# Patient Record
Sex: Female | Born: 1945
Health system: Southern US, Community
[De-identification: ages and names within clinical notes are randomized; demographics above are authoritative.]

## PROBLEM LIST (undated history)

## (undated) DIAGNOSIS — J439 Emphysema, unspecified: Secondary | ICD-10-CM

## (undated) DIAGNOSIS — E785 Hyperlipidemia, unspecified: Secondary | ICD-10-CM

## (undated) DIAGNOSIS — K900 Celiac disease: Secondary | ICD-10-CM

## (undated) DIAGNOSIS — D6851 Activated protein C resistance: Secondary | ICD-10-CM

## (undated) DIAGNOSIS — Z923 Personal history of irradiation: Secondary | ICD-10-CM

## (undated) DIAGNOSIS — I639 Cerebral infarction, unspecified: Secondary | ICD-10-CM

## (undated) DIAGNOSIS — I6521 Occlusion and stenosis of right carotid artery: Secondary | ICD-10-CM

## (undated) DIAGNOSIS — C3490 Malignant neoplasm of unspecified part of unspecified bronchus or lung: Secondary | ICD-10-CM

## (undated) DIAGNOSIS — I10 Essential (primary) hypertension: Secondary | ICD-10-CM

## (undated) DIAGNOSIS — I739 Peripheral vascular disease, unspecified: Secondary | ICD-10-CM

## (undated) DIAGNOSIS — C50919 Malignant neoplasm of unspecified site of unspecified female breast: Secondary | ICD-10-CM

## (undated) DIAGNOSIS — R0600 Dyspnea, unspecified: Secondary | ICD-10-CM

## (undated) DIAGNOSIS — J123 Human metapneumovirus pneumonia: Secondary | ICD-10-CM

## (undated) DIAGNOSIS — Z8719 Personal history of other diseases of the digestive system: Secondary | ICD-10-CM

## (undated) DIAGNOSIS — D759 Disease of blood and blood-forming organs, unspecified: Secondary | ICD-10-CM

## (undated) DIAGNOSIS — J9600 Acute respiratory failure, unspecified whether with hypoxia or hypercapnia: Secondary | ICD-10-CM

## (undated) HISTORY — DX: Hyperlipidemia, unspecified: E78.5

## (undated) HISTORY — DX: Essential (primary) hypertension: I10

## (undated) HISTORY — DX: Malignant neoplasm of unspecified site of unspecified female breast: C50.919

## (undated) HISTORY — DX: Occlusion and stenosis of right carotid artery: I65.21

## (undated) HISTORY — PX: TRIGGER FINGER RELEASE: SHX641

## (undated) HISTORY — PX: BREAST LUMPECTOMY: SHX2

## (undated) HISTORY — DX: Cerebral infarction, unspecified: I63.9

## (undated) HISTORY — DX: Emphysema, unspecified: J43.9

## (undated) HISTORY — DX: Human metapneumovirus pneumonia: J12.3

## (undated) HISTORY — DX: Malignant neoplasm of unspecified part of unspecified bronchus or lung: C34.90

---

## 1978-06-16 HISTORY — PX: TUBAL LIGATION: SHX77

## 1998-07-06 ENCOUNTER — Other Ambulatory Visit: Admission: RE | Admit: 1998-07-06 | Discharge: 1998-07-06 | Payer: Self-pay | Admitting: Obstetrics and Gynecology

## 1998-10-18 ENCOUNTER — Encounter: Payer: Self-pay | Admitting: Internal Medicine

## 1998-10-18 ENCOUNTER — Ambulatory Visit (HOSPITAL_COMMUNITY): Admission: RE | Admit: 1998-10-18 | Discharge: 1998-10-18 | Payer: Self-pay | Admitting: Internal Medicine

## 1999-08-01 ENCOUNTER — Other Ambulatory Visit: Admission: RE | Admit: 1999-08-01 | Discharge: 1999-08-01 | Payer: Self-pay | Admitting: Obstetrics and Gynecology

## 1999-08-09 ENCOUNTER — Encounter: Payer: Self-pay | Admitting: Obstetrics and Gynecology

## 1999-08-09 ENCOUNTER — Encounter: Admission: RE | Admit: 1999-08-09 | Discharge: 1999-08-09 | Payer: Self-pay | Admitting: Obstetrics and Gynecology

## 1999-09-05 ENCOUNTER — Ambulatory Visit (HOSPITAL_COMMUNITY): Admission: RE | Admit: 1999-09-05 | Discharge: 1999-09-05 | Payer: Self-pay | Admitting: Obstetrics and Gynecology

## 1999-09-05 ENCOUNTER — Encounter: Payer: Self-pay | Admitting: Obstetrics and Gynecology

## 2000-04-01 ENCOUNTER — Encounter (INDEPENDENT_AMBULATORY_CARE_PROVIDER_SITE_OTHER): Payer: Self-pay

## 2000-04-01 ENCOUNTER — Other Ambulatory Visit: Admission: RE | Admit: 2000-04-01 | Discharge: 2000-04-01 | Payer: Self-pay | Admitting: Obstetrics and Gynecology

## 2000-08-10 ENCOUNTER — Other Ambulatory Visit: Admission: RE | Admit: 2000-08-10 | Discharge: 2000-08-10 | Payer: Self-pay | Admitting: Obstetrics and Gynecology

## 2000-09-03 ENCOUNTER — Encounter: Payer: Self-pay | Admitting: Obstetrics and Gynecology

## 2000-09-03 ENCOUNTER — Encounter: Admission: RE | Admit: 2000-09-03 | Discharge: 2000-09-03 | Payer: Self-pay | Admitting: Obstetrics and Gynecology

## 2001-01-04 ENCOUNTER — Encounter: Payer: Self-pay | Admitting: Internal Medicine

## 2001-01-04 ENCOUNTER — Encounter: Admission: RE | Admit: 2001-01-04 | Discharge: 2001-01-04 | Payer: Self-pay | Admitting: Internal Medicine

## 2001-09-09 ENCOUNTER — Other Ambulatory Visit: Admission: RE | Admit: 2001-09-09 | Discharge: 2001-09-09 | Payer: Self-pay | Admitting: Obstetrics and Gynecology

## 2003-01-12 ENCOUNTER — Encounter: Admission: RE | Admit: 2003-01-12 | Discharge: 2003-01-12 | Payer: Self-pay | Admitting: Obstetrics and Gynecology

## 2003-01-12 ENCOUNTER — Encounter: Payer: Self-pay | Admitting: Obstetrics and Gynecology

## 2003-02-01 ENCOUNTER — Other Ambulatory Visit: Admission: RE | Admit: 2003-02-01 | Discharge: 2003-02-01 | Payer: Self-pay | Admitting: Obstetrics and Gynecology

## 2003-06-17 DIAGNOSIS — C3490 Malignant neoplasm of unspecified part of unspecified bronchus or lung: Secondary | ICD-10-CM

## 2003-06-17 HISTORY — DX: Malignant neoplasm of unspecified part of unspecified bronchus or lung: C34.90

## 2003-06-17 HISTORY — PX: LUNG REMOVAL, PARTIAL: SHX233

## 2003-08-30 ENCOUNTER — Encounter (INDEPENDENT_AMBULATORY_CARE_PROVIDER_SITE_OTHER): Payer: Self-pay | Admitting: Specialist

## 2003-08-30 ENCOUNTER — Ambulatory Visit: Admission: RE | Admit: 2003-08-30 | Discharge: 2003-08-30 | Payer: Self-pay | Admitting: Internal Medicine

## 2003-09-11 ENCOUNTER — Encounter: Admission: RE | Admit: 2003-09-11 | Discharge: 2003-09-11 | Payer: Self-pay | Admitting: Internal Medicine

## 2003-09-19 ENCOUNTER — Ambulatory Visit (HOSPITAL_COMMUNITY): Admission: RE | Admit: 2003-09-19 | Discharge: 2003-09-19 | Payer: Self-pay | Admitting: Internal Medicine

## 2003-10-06 ENCOUNTER — Ambulatory Visit (HOSPITAL_COMMUNITY): Admission: RE | Admit: 2003-10-06 | Discharge: 2003-10-06 | Payer: Self-pay | Admitting: Thoracic Surgery

## 2003-10-09 ENCOUNTER — Ambulatory Visit (HOSPITAL_COMMUNITY): Admission: RE | Admit: 2003-10-09 | Discharge: 2003-10-09 | Payer: Self-pay | Admitting: Thoracic Surgery

## 2003-10-09 ENCOUNTER — Encounter (INDEPENDENT_AMBULATORY_CARE_PROVIDER_SITE_OTHER): Payer: Self-pay | Admitting: Specialist

## 2003-10-17 ENCOUNTER — Ambulatory Visit: Admission: RE | Admit: 2003-10-17 | Discharge: 2003-12-21 | Payer: Self-pay | Admitting: Radiation Oncology

## 2003-10-23 ENCOUNTER — Ambulatory Visit (HOSPITAL_COMMUNITY): Admission: RE | Admit: 2003-10-23 | Discharge: 2003-10-23 | Payer: Self-pay | Admitting: Oncology

## 2003-11-17 ENCOUNTER — Ambulatory Visit (HOSPITAL_COMMUNITY): Admission: RE | Admit: 2003-11-17 | Discharge: 2003-11-17 | Payer: Self-pay | Admitting: Oncology

## 2003-12-27 ENCOUNTER — Encounter: Admission: RE | Admit: 2003-12-27 | Discharge: 2003-12-27 | Payer: Self-pay | Admitting: Thoracic Surgery

## 2004-01-02 ENCOUNTER — Ambulatory Visit: Admission: RE | Admit: 2004-01-02 | Discharge: 2004-01-02 | Payer: Self-pay | Admitting: Radiation Oncology

## 2004-01-18 ENCOUNTER — Encounter: Admission: RE | Admit: 2004-01-18 | Discharge: 2004-01-18 | Payer: Self-pay | Admitting: Thoracic Surgery

## 2004-01-29 ENCOUNTER — Encounter (INDEPENDENT_AMBULATORY_CARE_PROVIDER_SITE_OTHER): Payer: Self-pay | Admitting: *Deleted

## 2004-01-29 ENCOUNTER — Inpatient Hospital Stay (HOSPITAL_COMMUNITY): Admission: RE | Admit: 2004-01-29 | Discharge: 2004-02-07 | Payer: Self-pay | Admitting: Thoracic Surgery

## 2004-02-14 ENCOUNTER — Encounter: Admission: RE | Admit: 2004-02-14 | Discharge: 2004-02-14 | Payer: Self-pay | Admitting: Thoracic Surgery

## 2004-03-06 ENCOUNTER — Encounter: Admission: RE | Admit: 2004-03-06 | Discharge: 2004-03-06 | Payer: Self-pay | Admitting: Thoracic Surgery

## 2004-04-26 ENCOUNTER — Ambulatory Visit: Payer: Self-pay | Admitting: Oncology

## 2004-05-15 ENCOUNTER — Encounter: Admission: RE | Admit: 2004-05-15 | Discharge: 2004-05-15 | Payer: Self-pay | Admitting: Thoracic Surgery

## 2004-06-20 ENCOUNTER — Ambulatory Visit (HOSPITAL_COMMUNITY): Admission: RE | Admit: 2004-06-20 | Discharge: 2004-06-20 | Payer: Self-pay | Admitting: Oncology

## 2004-06-27 ENCOUNTER — Ambulatory Visit: Payer: Self-pay | Admitting: Oncology

## 2004-08-14 ENCOUNTER — Encounter: Admission: RE | Admit: 2004-08-14 | Discharge: 2004-08-14 | Payer: Self-pay | Admitting: Thoracic Surgery

## 2004-09-16 ENCOUNTER — Ambulatory Visit (HOSPITAL_COMMUNITY): Admission: RE | Admit: 2004-09-16 | Discharge: 2004-09-16 | Payer: Self-pay | Admitting: Oncology

## 2004-09-25 ENCOUNTER — Ambulatory Visit: Payer: Self-pay | Admitting: Oncology

## 2004-10-14 ENCOUNTER — Other Ambulatory Visit: Admission: RE | Admit: 2004-10-14 | Discharge: 2004-10-14 | Payer: Self-pay | Admitting: Obstetrics and Gynecology

## 2004-10-18 ENCOUNTER — Encounter: Admission: RE | Admit: 2004-10-18 | Discharge: 2004-10-18 | Payer: Self-pay | Admitting: Obstetrics and Gynecology

## 2004-12-11 ENCOUNTER — Ambulatory Visit (HOSPITAL_COMMUNITY): Admission: RE | Admit: 2004-12-11 | Discharge: 2004-12-11 | Payer: Self-pay | Admitting: Oncology

## 2004-12-27 ENCOUNTER — Ambulatory Visit: Payer: Self-pay | Admitting: Oncology

## 2005-01-15 ENCOUNTER — Encounter: Admission: RE | Admit: 2005-01-15 | Discharge: 2005-01-15 | Payer: Self-pay | Admitting: Thoracic Surgery

## 2005-04-18 ENCOUNTER — Ambulatory Visit: Payer: Self-pay | Admitting: Oncology

## 2005-04-23 ENCOUNTER — Ambulatory Visit (HOSPITAL_COMMUNITY): Admission: RE | Admit: 2005-04-23 | Discharge: 2005-04-23 | Payer: Self-pay | Admitting: Oncology

## 2005-07-23 ENCOUNTER — Encounter: Admission: RE | Admit: 2005-07-23 | Discharge: 2005-07-23 | Payer: Self-pay | Admitting: Thoracic Surgery

## 2005-08-08 ENCOUNTER — Ambulatory Visit: Payer: Self-pay | Admitting: Oncology

## 2005-08-13 ENCOUNTER — Ambulatory Visit (HOSPITAL_COMMUNITY): Admission: RE | Admit: 2005-08-13 | Discharge: 2005-08-13 | Payer: Self-pay | Admitting: Oncology

## 2005-11-03 ENCOUNTER — Other Ambulatory Visit: Admission: RE | Admit: 2005-11-03 | Discharge: 2005-11-03 | Payer: Self-pay | Admitting: Obstetrics and Gynecology

## 2005-11-18 ENCOUNTER — Encounter: Admission: RE | Admit: 2005-11-18 | Discharge: 2005-11-18 | Payer: Self-pay | Admitting: Obstetrics and Gynecology

## 2006-01-21 ENCOUNTER — Encounter: Admission: RE | Admit: 2006-01-21 | Discharge: 2006-01-21 | Payer: Self-pay | Admitting: Thoracic Surgery

## 2006-02-09 ENCOUNTER — Ambulatory Visit: Payer: Self-pay | Admitting: Oncology

## 2006-02-11 LAB — COMPREHENSIVE METABOLIC PANEL
ALT: 19 U/L (ref 0–40)
Alkaline Phosphatase: 70 U/L (ref 39–117)
CO2: 28 mEq/L (ref 19–32)
Potassium: 4.5 mEq/L (ref 3.5–5.3)
Sodium: 138 mEq/L (ref 135–145)
Total Bilirubin: 0.2 mg/dL — ABNORMAL LOW (ref 0.3–1.2)
Total Protein: 6.9 g/dL (ref 6.0–8.3)

## 2006-02-11 LAB — CBC WITH DIFFERENTIAL/PLATELET
EOS%: 0.9 % (ref 0.0–7.0)
Eosinophils Absolute: 0.1 10*3/uL (ref 0.0–0.5)
MCV: 86.2 fL (ref 81.0–101.0)
MONO%: 9.2 % (ref 0.0–13.0)
NEUT#: 5.3 10*3/uL (ref 1.5–6.5)
RBC: 4.54 10*6/uL (ref 3.70–5.32)
RDW: 13.6 % (ref 11.3–14.5)
lymph#: 1.7 10*3/uL (ref 0.9–3.3)

## 2006-02-11 LAB — CEA: CEA: 2.3 ng/mL (ref 0.0–5.0)

## 2006-02-17 ENCOUNTER — Ambulatory Visit (HOSPITAL_COMMUNITY): Admission: RE | Admit: 2006-02-17 | Discharge: 2006-02-17 | Payer: Self-pay | Admitting: Oncology

## 2006-08-14 ENCOUNTER — Ambulatory Visit: Payer: Self-pay | Admitting: Oncology

## 2006-08-19 LAB — CBC WITH DIFFERENTIAL/PLATELET
BASO%: 0.2 % (ref 0.0–2.0)
EOS%: 0.9 % (ref 0.0–7.0)
HCT: 39.2 % (ref 34.8–46.6)
LYMPH%: 23.5 % (ref 14.0–48.0)
MCH: 29 pg (ref 26.0–34.0)
MCHC: 34.4 g/dL (ref 32.0–36.0)
MONO%: 10.1 % (ref 0.0–13.0)
NEUT%: 65.3 % (ref 39.6–76.8)
Platelets: 352 10*3/uL (ref 145–400)

## 2006-08-19 LAB — COMPREHENSIVE METABOLIC PANEL
BUN: 15 mg/dL (ref 6–23)
CO2: 31 mEq/L (ref 19–32)
Calcium: 9.5 mg/dL (ref 8.4–10.5)
Chloride: 100 mEq/L (ref 96–112)
Creatinine, Ser: 0.77 mg/dL (ref 0.40–1.20)
Glucose, Bld: 100 mg/dL — ABNORMAL HIGH (ref 70–99)

## 2006-08-19 LAB — LACTATE DEHYDROGENASE: LDH: 155 U/L (ref 94–250)

## 2006-08-19 LAB — CEA: CEA: 2.1 ng/mL (ref 0.0–5.0)

## 2006-08-21 ENCOUNTER — Ambulatory Visit (HOSPITAL_COMMUNITY): Admission: RE | Admit: 2006-08-21 | Discharge: 2006-08-21 | Payer: Self-pay | Admitting: Oncology

## 2006-12-16 ENCOUNTER — Encounter: Admission: RE | Admit: 2006-12-16 | Discharge: 2006-12-16 | Payer: Self-pay | Admitting: Family Medicine

## 2007-02-12 ENCOUNTER — Other Ambulatory Visit: Admission: RE | Admit: 2007-02-12 | Discharge: 2007-02-12 | Payer: Self-pay | Admitting: Family Medicine

## 2007-02-12 ENCOUNTER — Ambulatory Visit: Payer: Self-pay | Admitting: Oncology

## 2007-02-17 LAB — COMPREHENSIVE METABOLIC PANEL
ALT: 23 U/L (ref 0–35)
Albumin: 4.5 g/dL (ref 3.5–5.2)
Alkaline Phosphatase: 78 U/L (ref 39–117)
CO2: 25 mEq/L (ref 19–32)
Glucose, Bld: 90 mg/dL (ref 70–99)
Potassium: 4.4 mEq/L (ref 3.5–5.3)
Sodium: 136 mEq/L (ref 135–145)
Total Protein: 7.2 g/dL (ref 6.0–8.3)

## 2007-02-17 LAB — CBC WITH DIFFERENTIAL/PLATELET
BASO%: 0.9 % (ref 0.0–2.0)
Eosinophils Absolute: 0.1 10*3/uL (ref 0.0–0.5)
MONO#: 0.7 10*3/uL (ref 0.1–0.9)
MONO%: 9.5 % (ref 0.0–13.0)
NEUT#: 4.8 10*3/uL (ref 1.5–6.5)
RBC: 4.48 10*6/uL (ref 3.70–5.32)
RDW: 13.9 % (ref 11.3–14.5)
WBC: 7.5 10*3/uL (ref 3.9–10.0)

## 2007-02-19 ENCOUNTER — Ambulatory Visit (HOSPITAL_COMMUNITY): Admission: RE | Admit: 2007-02-19 | Discharge: 2007-02-19 | Payer: Self-pay | Admitting: Oncology

## 2007-03-11 ENCOUNTER — Ambulatory Visit: Payer: Self-pay | Admitting: Thoracic Surgery

## 2007-03-18 ENCOUNTER — Ambulatory Visit: Payer: Self-pay | Admitting: *Deleted

## 2007-03-30 ENCOUNTER — Encounter: Admission: RE | Admit: 2007-03-30 | Discharge: 2007-03-30 | Payer: Self-pay | Admitting: Family Medicine

## 2007-05-20 ENCOUNTER — Ambulatory Visit: Payer: Self-pay | Admitting: Oncology

## 2007-05-24 ENCOUNTER — Ambulatory Visit (HOSPITAL_COMMUNITY): Admission: RE | Admit: 2007-05-24 | Discharge: 2007-05-24 | Payer: Self-pay | Admitting: Oncology

## 2007-05-24 LAB — CBC WITH DIFFERENTIAL/PLATELET
Basophils Absolute: 0 10*3/uL (ref 0.0–0.1)
Eosinophils Absolute: 0.1 10*3/uL (ref 0.0–0.5)
HGB: 13.2 g/dL (ref 11.6–15.9)
MCV: 86.4 fL (ref 81.0–101.0)
MONO#: 0.7 10*3/uL (ref 0.1–0.9)
MONO%: 11.5 % (ref 0.0–13.0)
NEUT#: 3.6 10*3/uL (ref 1.5–6.5)
RDW: 13.1 % (ref 11.3–14.5)

## 2007-05-24 LAB — COMPREHENSIVE METABOLIC PANEL
Albumin: 4.1 g/dL (ref 3.5–5.2)
CO2: 28 mEq/L (ref 19–32)
Calcium: 9.1 mg/dL (ref 8.4–10.5)
Chloride: 102 mEq/L (ref 96–112)
Glucose, Bld: 107 mg/dL — ABNORMAL HIGH (ref 70–99)
Potassium: 4.2 mEq/L (ref 3.5–5.3)
Sodium: 138 mEq/L (ref 135–145)
Total Bilirubin: 0.5 mg/dL (ref 0.3–1.2)
Total Protein: 7.2 g/dL (ref 6.0–8.3)

## 2007-05-24 LAB — LACTATE DEHYDROGENASE: LDH: 143 U/L (ref 94–250)

## 2007-06-03 ENCOUNTER — Ambulatory Visit: Payer: Self-pay | Admitting: Thoracic Surgery

## 2007-09-23 ENCOUNTER — Ambulatory Visit: Payer: Self-pay | Admitting: *Deleted

## 2007-11-18 ENCOUNTER — Ambulatory Visit: Payer: Self-pay | Admitting: Oncology

## 2007-11-22 LAB — CBC WITH DIFFERENTIAL/PLATELET
Basophils Absolute: 0 10*3/uL (ref 0.0–0.1)
EOS%: 1 % (ref 0.0–7.0)
HCT: 38.2 % (ref 34.8–46.6)
HGB: 13.1 g/dL (ref 11.6–15.9)
MCH: 29.1 pg (ref 26.0–34.0)
MCV: 85.1 fL (ref 81.0–101.0)
MONO%: 9.5 % (ref 0.0–13.0)
NEUT%: 63.5 % (ref 39.6–76.8)
Platelets: 327 10*3/uL (ref 145–400)

## 2007-11-22 LAB — COMPREHENSIVE METABOLIC PANEL
AST: 26 U/L (ref 0–37)
Alkaline Phosphatase: 63 U/L (ref 39–117)
BUN: 9 mg/dL (ref 6–23)
Calcium: 9.4 mg/dL (ref 8.4–10.5)
Chloride: 105 mEq/L (ref 96–112)
Creatinine, Ser: 0.69 mg/dL (ref 0.40–1.20)

## 2007-11-24 ENCOUNTER — Ambulatory Visit (HOSPITAL_COMMUNITY): Admission: RE | Admit: 2007-11-24 | Discharge: 2007-11-24 | Payer: Self-pay | Admitting: Oncology

## 2007-12-01 ENCOUNTER — Encounter: Admission: RE | Admit: 2007-12-01 | Discharge: 2007-12-01 | Payer: Self-pay | Admitting: Thoracic Surgery

## 2007-12-01 ENCOUNTER — Ambulatory Visit: Payer: Self-pay | Admitting: Thoracic Surgery

## 2007-12-24 ENCOUNTER — Other Ambulatory Visit: Admission: RE | Admit: 2007-12-24 | Discharge: 2007-12-24 | Payer: Self-pay | Admitting: Obstetrics and Gynecology

## 2007-12-29 ENCOUNTER — Encounter: Admission: RE | Admit: 2007-12-29 | Discharge: 2007-12-29 | Payer: Self-pay | Admitting: Obstetrics and Gynecology

## 2008-01-06 ENCOUNTER — Encounter: Admission: RE | Admit: 2008-01-06 | Discharge: 2008-01-06 | Payer: Self-pay | Admitting: Obstetrics and Gynecology

## 2008-04-06 ENCOUNTER — Ambulatory Visit: Payer: Self-pay | Admitting: *Deleted

## 2008-05-26 ENCOUNTER — Ambulatory Visit: Payer: Self-pay | Admitting: Oncology

## 2008-05-30 LAB — COMPREHENSIVE METABOLIC PANEL
ALT: 22 U/L (ref 0–35)
AST: 24 U/L (ref 0–37)
Albumin: 4.5 g/dL (ref 3.5–5.2)
Alkaline Phosphatase: 54 U/L (ref 39–117)
BUN: 13 mg/dL (ref 6–23)
CO2: 27 mEq/L (ref 19–32)
Calcium: 9.2 mg/dL (ref 8.4–10.5)
Chloride: 104 mEq/L (ref 96–112)
Creatinine, Ser: 0.74 mg/dL (ref 0.40–1.20)
Glucose, Bld: 118 mg/dL — ABNORMAL HIGH (ref 70–99)
Potassium: 4.3 mEq/L (ref 3.5–5.3)
Sodium: 143 mEq/L (ref 135–145)
Total Bilirubin: 0.3 mg/dL (ref 0.3–1.2)
Total Protein: 7 g/dL (ref 6.0–8.3)

## 2008-05-30 LAB — CBC WITH DIFFERENTIAL/PLATELET
BASO%: 0.3 % (ref 0.0–2.0)
Basophils Absolute: 0 10*3/uL (ref 0.0–0.1)
EOS%: 0.9 % (ref 0.0–7.0)
Eosinophils Absolute: 0.1 10*3/uL (ref 0.0–0.5)
HCT: 37.5 % (ref 34.8–46.6)
HGB: 12.6 g/dL (ref 11.6–15.9)
LYMPH%: 21.5 % (ref 14.0–48.0)
MCH: 29.4 pg (ref 26.0–34.0)
MCHC: 33.5 g/dL (ref 32.0–36.0)
MCV: 87.7 fL (ref 81.0–101.0)
MONO#: 0.7 10*3/uL (ref 0.1–0.9)
MONO%: 9 % (ref 0.0–13.0)
NEUT#: 5.4 10*3/uL (ref 1.5–6.5)
NEUT%: 68.3 % (ref 39.6–76.8)
Platelets: 315 10*3/uL (ref 145–400)
RBC: 4.28 10*6/uL (ref 3.70–5.32)
RDW: 13.9 % (ref 11.3–14.5)
WBC: 7.9 10*3/uL (ref 3.9–10.0)
lymph#: 1.7 10*3/uL (ref 0.9–3.3)

## 2008-06-07 ENCOUNTER — Ambulatory Visit: Payer: Self-pay | Admitting: Thoracic Surgery

## 2008-06-07 ENCOUNTER — Encounter: Admission: RE | Admit: 2008-06-07 | Discharge: 2008-06-07 | Payer: Self-pay | Admitting: Thoracic Surgery

## 2008-11-21 ENCOUNTER — Ambulatory Visit: Payer: Self-pay | Admitting: Oncology

## 2008-11-23 LAB — COMPREHENSIVE METABOLIC PANEL
Albumin: 4.4 g/dL (ref 3.5–5.2)
CO2: 27 mEq/L (ref 19–32)
Calcium: 9.6 mg/dL (ref 8.4–10.5)
Chloride: 106 mEq/L (ref 96–112)
Glucose, Bld: 92 mg/dL (ref 70–99)
Sodium: 142 mEq/L (ref 135–145)
Total Bilirubin: 0.3 mg/dL (ref 0.3–1.2)
Total Protein: 7.1 g/dL (ref 6.0–8.3)

## 2008-11-23 LAB — CBC WITH DIFFERENTIAL/PLATELET
Eosinophils Absolute: 0.1 10*3/uL (ref 0.0–0.5)
HCT: 39.9 % (ref 34.8–46.6)
LYMPH%: 23.9 % (ref 14.0–49.7)
MONO#: 0.6 10*3/uL (ref 0.1–0.9)
NEUT#: 5 10*3/uL (ref 1.5–6.5)
Platelets: 319 10*3/uL (ref 145–400)
RBC: 4.49 10*6/uL (ref 3.70–5.45)
WBC: 7.5 10*3/uL (ref 3.9–10.3)
lymph#: 1.8 10*3/uL (ref 0.9–3.3)

## 2008-11-23 LAB — LACTATE DEHYDROGENASE: LDH: 155 U/L (ref 94–250)

## 2008-11-27 ENCOUNTER — Ambulatory Visit (HOSPITAL_COMMUNITY): Admission: RE | Admit: 2008-11-27 | Discharge: 2008-11-27 | Payer: Self-pay | Admitting: Oncology

## 2008-12-06 ENCOUNTER — Ambulatory Visit: Payer: Self-pay | Admitting: Thoracic Surgery

## 2008-12-29 ENCOUNTER — Encounter: Admission: RE | Admit: 2008-12-29 | Discharge: 2008-12-29 | Payer: Self-pay | Admitting: Family Medicine

## 2009-02-14 ENCOUNTER — Other Ambulatory Visit: Admission: RE | Admit: 2009-02-14 | Discharge: 2009-02-14 | Payer: Self-pay | Admitting: Family Medicine

## 2009-03-29 ENCOUNTER — Ambulatory Visit: Payer: Self-pay | Admitting: Surgery

## 2009-11-21 ENCOUNTER — Ambulatory Visit: Payer: Self-pay | Admitting: Oncology

## 2009-11-23 ENCOUNTER — Ambulatory Visit (HOSPITAL_COMMUNITY): Admission: RE | Admit: 2009-11-23 | Discharge: 2009-11-23 | Payer: Self-pay | Admitting: Oncology

## 2009-11-23 LAB — COMPREHENSIVE METABOLIC PANEL
AST: 23 U/L (ref 0–37)
Alkaline Phosphatase: 56 U/L (ref 39–117)
BUN: 13 mg/dL (ref 6–23)
Calcium: 9.8 mg/dL (ref 8.4–10.5)
Creatinine, Ser: 0.83 mg/dL (ref 0.40–1.20)

## 2009-11-23 LAB — CBC WITH DIFFERENTIAL/PLATELET
Basophils Absolute: 0 10*3/uL (ref 0.0–0.1)
EOS%: 1.4 % (ref 0.0–7.0)
HCT: 40.1 % (ref 34.8–46.6)
HGB: 13.6 g/dL (ref 11.6–15.9)
MCH: 30.4 pg (ref 25.1–34.0)
MCV: 89.3 fL (ref 79.5–101.0)
MONO%: 11.1 % (ref 0.0–14.0)
NEUT%: 55.3 % (ref 38.4–76.8)

## 2010-01-16 ENCOUNTER — Encounter: Admission: RE | Admit: 2010-01-16 | Discharge: 2010-01-16 | Payer: Self-pay | Admitting: Family Medicine

## 2010-03-11 ENCOUNTER — Other Ambulatory Visit: Admission: RE | Admit: 2010-03-11 | Discharge: 2010-03-11 | Payer: Self-pay | Admitting: Family Medicine

## 2010-07-07 ENCOUNTER — Encounter: Payer: Self-pay | Admitting: Family Medicine

## 2010-07-07 ENCOUNTER — Encounter: Payer: Self-pay | Admitting: Oncology

## 2010-07-08 ENCOUNTER — Encounter: Payer: Self-pay | Admitting: Obstetrics and Gynecology

## 2010-07-08 ENCOUNTER — Encounter: Payer: Self-pay | Admitting: Family Medicine

## 2010-10-29 NOTE — Procedures (Signed)
CAROTID DUPLEX EXAM   INDICATION:  Followup carotid artery disease.   HISTORY:  Diabetes:  No.  Cardiac:  No.  Hypertension:  Yes.  Smoking:  No.  Previous Surgery:  No.  CV History:  TIA years ago, asymptomatic now.  Amaurosis Fugax No, Paresthesias No, Hemiparesis No                                       RIGHT             LEFT  Brachial systolic pressure:         120               115  Brachial Doppler waveforms:         WNL               WNL  Vertebral direction of flow:        Antegrade         Antegrade  DUPLEX VELOCITIES (cm/sec)  CCA peak systolic                   P=196, M=133      P=190, K=998  ECA peak systolic                   101               338  ICA peak systolic                   118               250  ICA end diastolic                   42                44  PLAQUE MORPHOLOGY:                  Mixed             Mixed  PLAQUE AMOUNT:                      Mild/moderate     Mild/moderate  PLAQUE LOCATION:                    ICA/CCA/ECA       ICA/CCA/ECA   IMPRESSION:  1. Bilateral internal carotid arteries show evidence of 40%-59%      stenosis (low end of range).  2. Stable elevated velocities in bilateral proximal common carotid      arteries.  3. No significant changes from previous studies.   ___________________________________________  V. Leia Alf, MD   AS/MEDQ  D:  03/29/2009  T:  03/29/2009  Job:  [id number]

## 2010-10-29 NOTE — Letter (Signed)
December 01, 2007   Lennette Bihari L. Little, M.D.  [address], [place] [address]   Re:  Jessica Gilbert, Jessica Gilbert               DOB:  Apr 19, 1946   Dear Lennette Bihari,   I saw the patient back.  She had been seen by Dr. Truddie Coco who had got a  PET scan and CT scan that showed no evidence of recurrence of her  cancer.  This was done on 11/24/2007.  Approximately 4 to 5 days ago,  she was eating and started coughing a lot, and she says since then, she  has had more shortness of breath with exertion and wheezing.   On physical exam, she had some wheezes in the right lower lobe, and she  has bilateral breath sounds.  She has had no fevers, chills, or  excessive sputum.  Her blood pressure was 142/55, pulse 84, respirations  is 18, and sats were 97% on room air.   I went ahead and ordered a chest x-ray on her, which I will follow up,  and then put her on Combivent 2 puffs twice a day for the next week and  see if this will help her as far as respiratory status.  If that does  not, then I thought it will be good for her to see you for followup.  I  will see her again in 6 months after her next PET/CT scan.   Sincerely,   Nicanor Alcon, M.D.  Electronically Signed   DPB/MEDQ  D:  12/01/2007  T:  12/02/2007  Job:  [id number]   cc:   Eston Esters, MD

## 2010-10-29 NOTE — Assessment & Plan Note (Signed)
OFFICE VISIT   Jessica Gilbert, Jessica Gilbert  DOB:  06-26-1945                                        December 06, 2008  CHART #:  [id number]   The patient came for followup today.  She is now 5 years since her  surgery and CT scan shows no evidence of recurrence of her cancer.  Dr.  Truddie Coco will follow her yearly and I will allow him to follow her from now  on.  Her blood pressure was 129/79, pulse 77, respirations 18, and  saturations 96%.   Nicanor Alcon, M.D.  Electronically Signed   DPB/MEDQ  D:  12/06/2008  T:  12/07/2008  Job:  [id number]   cc:   Eston Esters, MD

## 2010-10-29 NOTE — Procedures (Signed)
CAROTID DUPLEX EXAM   INDICATION:  Follow up known carotid artery disease.   HISTORY:  Diabetes:  No.  Cardiac:  No.  Hypertension:  Yes.  Smoking:  No.  Previous Surgery:  CV History:  Amaurosis Fugax No, Paresthesias No, Hemiparesis No.                                       RIGHT             LEFT  Brachial systolic pressure:         130               120  Brachial Doppler waveforms:         Biphasic          Biphasic  Vertebral direction of flow:        Antegrade         Antegrade  DUPLEX VELOCITIES (cm/sec)  CCA peak systolic                   207               400  ECA peak systolic                   97                867  ICA peak systolic                   95                125 (mid)  ICA end diastolic                   29                48  PLAQUE MORPHOLOGY:                  Heterogenous      Heterogenous  PLAQUE AMOUNT:                      Mild              Moderate  PLAQUE LOCATION:                    ICA, ECA          ICA, ECA   IMPRESSION:  1. Elevated velocities noted in bilateral common carotid arteries.  2. 40-59% stenosis noted in the left internal carotid artery.  3. 20-39% stenosis noted in the right internal carotid artery.  4. Antegrade bilateral vertebral arteries.   ___________________________________________  P. Drucie Opitz, M.D.   MG/MEDQ  D:  04/06/2008  T:  04/07/2008  Job:  [id number]

## 2010-10-29 NOTE — Procedures (Signed)
CAROTID DUPLEX EXAM   INDICATION:  Follow up known carotid artery disease.   HISTORY:  Diabetes:  No.  Cardiac:  No.  Hypertension:  Yes.  Smoking:  Quit three years ago.  Previous Surgery:  No.  CV History:  No.  Amaurosis Fugax No, Paresthesias No, Hemiparesis No                                       RIGHT             LEFT  Brachial systolic pressure:         150               140  Brachial Doppler waveforms:         Biphasic          Biphasic  Vertebral direction of flow:        Antegrade         Antegrade  DUPLEX VELOCITIES (cm/sec)  CCA peak systolic                   282 (proximal)    91  ECA peak systolic                   83                77  ICA peak systolic                   85                66  ICA end diastolic                   32                21  PLAQUE MORPHOLOGY:                  Calcified         Calcified  PLAQUE AMOUNT:                      Mild              Mild  PLAQUE LOCATION:                    ICA, ECA          ICA, ECA   IMPRESSION:  Increased velocity noted in the right common carotid artery  (proximal), possibly greater than 50% stenosis.  A 20-39% stenosis noted  in bilateral internal carotid arteries.  Antegrade bilateral vertebral  arteries.   ___________________________________________  P. Drucie Opitz, M.D.   MG/MEDQ  D:  03/18/2007  T:  03/19/2007  Job:  [id number]

## 2010-10-29 NOTE — Procedures (Signed)
CAROTID DUPLEX EXAM   INDICATION:  Followup of known carotid artery disease.  Patient is  asymptomatic.   HISTORY:  Diabetes:  No.  Cardiac:  No.  Hypertension:  Yes.  Smoking:  No.  Previous Surgery:  No.  CV History:  Amaurosis Fugax No, Paresthesias No, Hemiparesis No                                       RIGHT             LEFT  Brachial systolic pressure:         120               130  Brachial Doppler waveforms:         WNL               WNL  Vertebral direction of flow:        Antegrade         Antegrade  DUPLEX VELOCITIES (cm/sec)  CCA peak systolic                   238 (P)           236 (P)  ECA peak systolic                   99                017  ICA peak systolic                   144               90  ICA end diastolic                   50                24  PLAQUE MORPHOLOGY:                  Mixed, calcific   Calcified  PLAQUE AMOUNT:                      Moderate          Mild  PLAQUE LOCATION:                    Bifurcation, ICA  Bifurcation, ICA   IMPRESSION:  1. Elevated velocities noted at proximal common carotid arteries      bilaterally.  2. Right 40-59% internal carotid artery stenosis.  3. Left 20-39% internal carotid artery stenosis.  4. Patent external carotid artery stenoses.  5. Bilateral antegrade flow in vertebral arteries.   ___________________________________________  P. Drucie Opitz, M.D.   PB/MEDQ  D:  09/23/2007  T:  09/23/2007  Job:  [id number]

## 2010-10-29 NOTE — Letter (Signed)
June 07, 2008   Eston Esters, MD  [place], [place] [address]   Re:  Jessica Gilbert, Jessica Gilbert               DOB:  01/23/1946   Dear Collier Salina,   I saw the patient back today.  Her chest x-ray had no problems.  She is  now 4-1/2 years since we did a right upper lobectomy with chest wall  resection.  I will plan to get a CT scan in 6 months.  Her lungs are  clear to auscultation and percussion.  Her breathing is much better than  when we saw her last.  I appreciate the opportunity of seeing the  patient.   Sincerely.   Nicanor Alcon, M.D.  Electronically Signed   DPB/MEDQ  D:  06/07/2008  T:  06/07/2008  Job:  [id number]

## 2010-10-29 NOTE — Assessment & Plan Note (Signed)
OFFICE VISIT   Jessica Gilbert, Jessica Gilbert  DOB:  [birth year]                                        June 03, 2007  CHART #:  [id number]   She had a PET scan and a CT scan, showed no evidence of recurrence for  cancer 3-1/2 years in.  She had a right upper lobe resection and rib  resection.  She is doing well overall.  She still has a small lesion in  the right lower lobe that we are watching that I think is just scar  tissue, but we will see her again in 6 months with a PET scan and a CT  scan after she sees Dr. Truddie Coco.   Jessica Gilbert, M.D.  Electronically Signed   DPB/MEDQ  D:  06/03/2007  T:  06/04/2007  Job:  [id number]

## 2010-10-29 NOTE — Letter (Signed)
March 11, 2007   Dr. [name].  [address], [place]  [place], [place]  [address]   Re:  MARIEN, MANSHIP               DOB:  1945-06-22   Dear Lennette Bihari:   I saw Ms. Southall in the office today and reviewed her PET CT scans that  Dr. Truddie Coco had ordered as well as compared them with his old CT scan.  I  think this right lower lobe lesion is probably just scar tissue.  There  is always a chance this could be cancer, but there is minimal change  from the March to the September CT scan.  I agree with Dr. Truddie Coco.  Will  repeat her CT scan and PET scan in December and I will see her back at  that time.  Since the PET scan was negative, at worst case scenario this  is a slow-growing cancer which we can excise in 4-5 months from now with  same results as if we did surgery right now, but it really feel this is  probably scar tissue.  I will see her back again in December.   Her blood pressure is 146/88, pulse 80, respirations 18, sats were 98%.   Nicanor Alcon, M.D.  Electronically Signed   DPB/MEDQ  D:  03/11/2007  T:  03/12/2007  Job:  [id number]   cc:   Eston Esters, MD

## 2010-10-29 NOTE — Consult Note (Signed)
VASCULAR SURGERY CONSULTATION   Jessica Gilbert, Jessica Gilbert  DOB:  01-21-1946                                       03/18/2007  XENMM#:[id number]   REFERRING PHYSICIAN:  Lennette Bihari L. Little, M.D.   REASON FOR CONSULTATION:  Extracranial cerebrovascular occlusive disease  with right common carotid artery stenosis.   HISTORY:  Patient is a 65 year old female with a history of non-small  cell right chest Pancoast tumor, removed by Dr. Arlyce Dice in 2005.  She is  referred at this time for evaluation of abnormal carotid Doppler.  A  carotid Doppler study carried out at [facility] on 05/19/06 revealed a 70-99%  right proximal common carotid artery stenosis.  Repeat study today at  the [facility] lab reveals a proximal right common carotid artery with peak  systolic velocity at 031 cm/sec.   Patient does describe one distinct episode of left facial drooping and  left-sided weakness associated with some numbness on the left side of  the body, occurring 7-8 years ago.  She presumes this was diagnosed as a  transient ischemic attack.  She has had no further neurologic symptoms  since that time.  Denies sensory, motor, or visual deficit.  No speech  or swallowing problems.  Denies gait abnormality.   PAST MEDICAL HISTORY:  1. Hypertension.  2. Hyperlipidemia.  3. Non-small cell Pancoast tumor of right chest.  4. Factor V Leiden mutation.  5. Allergic rhinitis with polyps.   MEDICATIONS:  1. Micardis 40 mg p.o. daily.  2. Provastatin 20 mg daily.   ALLERGIES:  None known.   FAMILY HISTORY:  Mother died at age 1 of leukemia.  Father died at age  82 with a history of congestive heart failure and hypertension.  She has  one brother living, who suffered an MI at age 74.  She has two sisters  who are well.   SOCIAL HISTORY:  The patient is single with two children.  She continues  to work as an Optometrist.  She does not currently smoke.  She  discontinued tobacco use in May, 2005.  No alcohol  use.   REVIEW OF SYSTEMS:  Weight is stable.  Appetite good.  No chest pain or  shortness of breath.  Denies cough, sputum production, or hemoptysis.  She has no change in bowel habits, no abdominal pain.  Denies dysuria or  frequency.  She does have some joint discomfort.  No significant  depression.   PHYSICAL EXAMINATION:  Generally well-appearing 65 year old female,  alert and oriented.  Vital signs:  BP 148/90, both arms.  Pulse is 75.  Respirations 16.  Temperature 97.7.  HEENT:  Mouth and throat are clear.  Normocephalic.  Extraocular movements are intact.  Neck:  Supple.  No  thyromegaly or adenopathy.  Chest:  Equal air entry bilaterally.  No  rales or rhonchi.  Cardiovascular:  Soft right carotid bruit.  Heart  sounds are normal without murmurs.  No rubs.  Abdomen:  Soft, nontender.  No mass or organomegaly.  No abdominal bruits.  Extremities:  Femoral  pulses 2+ bilaterally.  Brachial and radial pulses 2+ bilaterally.  Neurologic:  Extraocular movements are intact.  Cranial nerves are  normal.  Strength is equal bilaterally.  Normal gait.  Reflexes 2+.   INVESTIGATIONS:  I did review her most recent CT scan, which does reveal  arch atherosclerotic  disease with plaque noted at the origin of the  innominate and right common carotid artery.   IMPRESSION:  1. Extra cranial atherosclerotic cerebrovascular disease with possible      significant right common carotid artery stenosis.  2. Hypertension.  3. Hyperlipidemia.  4. History of Pancoast tumor, right chest.   RECOMMENDATIONS:  I have recommended at this time an arch cerebral  arteriogram to further evaluate the extent of the proximal right common  carotid artery stenosis.  Concern is progressive stenosis and increasing  risk of potential stroke.  I have discussed the details, indications,  and potential risks of this procedure.  Major morbidity mortality 1%.  The patient does wish to discuss this further with her sister.   I have  given her the number for our scheduling  nurse, Barron Schmid, RN.  Should she decide she does wish to pursue  arteriography, I have made an appointment to follow up with her in six  months for a repeat carotid Doppler evaluation.   Dorothea Glassman, M.D.  Electronically Signed  PGH/MEDQ  D:  03/18/2007  T:  03/19/2007  Job:  [id number]   cc:   Lennette Bihari L. Little, M.D.  Eston Esters, MD

## 2010-11-01 NOTE — Discharge Summary (Signed)
NAMEMarland Kitchen  Jessica Gilbert, Jessica Gilbert                         ACCOUNT NO.:  [id number]   MEDICAL RECORD NO.:  [id number]                   PATIENT TYPE:  INP   LOCATION:  2038                                 FACILITY:  [facility]   PHYSICIAN:  Nicanor Alcon, M.D.              DATE OF BIRTH:  11-15-1945   DATE OF ADMISSION:  01/29/2004  DATE OF DISCHARGE:  02/07/2004                                 DISCHARGE SUMMARY   HISTORY OF THE PRESENT ILLNESS:  This is a 65 year old female referred to  Dr. Nicanor Alcon by Dr. Christena Deem. Wert for evaluation of a right upper  lobe lung lesion.  The patient has been followed by Dr. Arlyce Dice since April  of 2005.  She initially presented to her primary care physician for pain in  the right shoulder.  She was found to have an opacity in the right upper  lobe with some cavitation.  This initially measured 4.0 x 3.2 cm.  A  bronchoscopy was performed which was negative.  A subsequent bone scan  showed no acute changes in the right upper shoulder.  The patient did  complain of mild hemoptysis at the time.  She has also had decreased  appetite and weight change.  An MRI revealed degeneration and spondylitic  changes of the cervical vertebrae as well as a right upper lobe mass with a  superior sulcus lesion that involved the inferior aspect of the brachial  plexus and upper T1 thoracic vertebra.  A PET scan was performed which was  positive for the right upper lobe mass.  A needle biopsy revealed a non-  small-cell lung cancer.  The patient was referred to Dr. Blair Promise for  radiation.  She has undergone and completed chemotherapy and radiation.  She  was diagnosed with an anticardiolipin factor and factor V mutation, leaving  the patient to be hypercoagulable.  A followup CT scan after chemotherapy  and radiation revealed a marked decrease in the right apical mass.  It was  Dr. Lorelei Pont opinion at this time that patient was a candidate for surgical  resection to  include right upper lobe lobectomy with chest wall resection of  her Pancoast tumor.  She was admitted this hospitalization for the  procedure.   PAST MEDICAL HISTORY:  1. History of hypertension.  2. Anticardiolipin factor and factor V mutation.  3. Non-small-cell lung cancer of the right upper lobe, status post     chemotherapy and radiation.   PAST SURGICAL HISTORY:  Tubal ligation 20 years ago.   MEDICATIONS ON ADMISSION:  1. Multivitamin 1 daily.  2. Vitamin C 1 daily.  3. Commit lozenges 2 mg p.r.n. for smoking cessation.   ALLERGIES:  No known drug allergies.   FAMILY HISTORY/SOCIAL HISTORY/REVIEW OF SYSTEMS/PHYSICAL EXAM:  Please see  the history and physical done at the time of admission.   HOSPITAL COURSE:  The patient was admitted  electively on January 29, 2004 and  taken to the operating room, where she underwent a right thoracotomy with  resection of the right upper lobe with 1st and 2nd ribs, as well as lymph  node dissection.  The patient tolerated the procedure well and was taken to  the postanesthesia care unit in stable condition.   POSTOPERATIVE HOSPITAL COURSE:  The patient has overall done quite well.  She did initially have some difficulty with significant secretions and  significant amount of atelectasis which have resolved over time with  aggressive pulmonary toilet and respiratory nebulizer treatment.  She has  remained hemodynamically stable.  She does have a postoperative anemia, but  is stable in this regard.  Most recent hemoglobin and hematocrit dated  February 05, 2004 were 10 and 29, respectively.  The patient has had some  hyponatremia which has resolved with adjustments in her diet and fluid.  She  has a normal urine output and BUN and creatinine are stable on February 05, 2004 at 5 and 0.6, respectively.  The patient's incisions are healing well  without signs of infection.  She has tolerated a gradual but good increase  in activity commensurate  for level of postoperative convalescence.  Pathology reveals that: #1 - The soft tissue and bone in the biopsy of the  chest wall margin were negative for tumor.  #2 - The lung with the right  upper lobe resection shows the following:  A) A 4-cm area of tumor  regression with central cavitary necrosis and rare atypical cells.  B)  Bronchial and vascular resection margins negative for tumor.  C) Chest wall  soft tissue resection margin negative for tumor including the striated  muscle.  D) Chest wall resection margin with bone pending.  E) Two level 12  lymph nodes negative for tumor.  All other lymph nodes of multiple levels  including 10-R, 11-R, 4-R and level 6 were all negative for tumor.  Currently, the patient is felt to be doing quite well and clinically is  stable for tentative discharge in the morning of February 07, 2004, pending  morning round reevaluation.   FINAL DIAGNOSES:  1. Non-small-cell lung carcinoma as described with postoperative pathology     findings, please see the detailed pathology report for further.  2. Other diagnoses as previously listed including:     a. History of hypertension.     b. Heavy tobacco abuse.     c. Anticardiolipin factor and factor V mutations.     d. Postoperative anemia.   MEDICATIONS ON DISCHARGE:  1. Lopressor 25 mg every 8 hours.  2. Niferex 150 mg daily.  3. Neurontin 300 mg q.8 h.  4. Multivitamin 1 daily.  5. Avelox 400 mg daily for pain.  6. Tylox 1-2 every 4-6 hours as needed.   INSTRUCTIONS:  The patient received written instructions in regard to  medications, activity, diet, wound care and followup.   FOLLOWUP:  Followup will include Dr. Arlyce Dice on Wednesday, February 14, 2004,  with a chest x-ray from [facility].  She will additionally follow up with oncology  as they require.      John Giovanni, P.A.-C.                    Nicanor Alcon, M.D.   Loren Racer  D:  02/06/2004  T:  02/07/2004  Job:  [id number]   cc:   Nicanor Alcon, M.D.  [address]  [place]  [place] [address]   Michael B. Melvyn Novas, M.D. [facility]   Eston Esters, M.D.  [address] [address]  Fax: [phone]   [facility]   Blair Promise, M.D.  [address] [address]  Fax: [phone]

## 2010-11-01 NOTE — H&P (Signed)
NAME:  KEAMBER, MACFADDEN                         ACCOUNT NO.:  [id number]   MEDICAL RECORD NO.:  [id number]                   PATIENT TYPE:  INP   LOCATION:  NA                                   FACILITY:  [facility]   PHYSICIAN:  Nicanor Alcon, M.D.              DATE OF BIRTH:  [birth year]   DATE OF ADMISSION:  01/29/2004  DATE OF DISCHARGE:                                HISTORY & PHYSICAL   Primary care physician is Priscille Heidelberg. Little, M.D.  Pulmonologist is Legrand Como B.  Melvyn Novas, M.D.  Oncologist is Eston Esters, M.D.   CHIEF COMPLAINT:  Lung lesion.   HISTORY OF PRESENT ILLNESS:  This is a 65 year old female referred to Dr.  Arlyce Dice by Dr. Melvyn Novas for evaluation of a right upper lobe lesion.  The patient  has been followed by Dr. Arlyce Dice since April 2005.  She initially presented  to her primary care physician for pain in the right shoulder.  She was found  to have an opacity in the right upper lobe with some cavitation.  This  initially measured 4 x 3.2 cm.  A bronchoscopy was performed, which was  negative.  A subsequent bone scan showed no acute changes in the right  shoulder.  The patient did complain of mild hemoptysis at the time.  She has  also had some decreased appetite and weight changes.  An MRI revealed marked  degeneration and spondylitic changes of the cervical vertebrae as well as  the right upper lobe mass with a superior sulcus lesion that involved the  inferior aspect of the brachial plexus and upper T1 thoracic vertebra.  A  PET scan was performed, which was positive in the right upper lobe mass.  A  needle biopsy revealed non-small cell lung cancer.  The patient was referred  to Dr. Sondra Come for radiation.  The patient has undergone and completed  chemotherapy and radiation.  She was diagnosed with an anticardiolipin  factor and factor V mutation, leading the patient to be hypercoagulable.  A  follow-up CT scan after chemotherapy and radiation revealed a marked  decrease in the  right apical mass.  Dr. Arlyce Dice now feels that the patient is  ready to undergo a right upper lobectomy with chest wall resection for her  Pancoast tumor.  The patient denies cough, sputum production, fever, chills,  weight loss, shortness of breath, angina, arrhythmias, peripheral edema,  hemoptysis, symptoms of TIA, CVA, as well as history of PE, DVT, GI  symptoms, and GU symptoms.  The patient does complain of occasional  gastroesophageal reflux disease symptoms.  She also does have a history of  pneumonia.   PAST MEDICAL HISTORY:  1. History of hypertension.  2. Anticardiolipin factor and factor V mutation.  3. Non-small cell lung cancer of the right upper lobe, status post     chemotherapy and radiation.   PAST SURGICAL HISTORY:  Tubal ligation approximately 20  years ago.   MEDICATIONS:  1. Multivitamin daily.  2. Vitamin C daily.  3. Commit Lozenges 2 mg p.r.n. for smoking cessation.   ALLERGIES:  No known drug allergies.   REVIEW OF SYSTEMS:  Please see HPI for significant positives and negatives.  Otherwise negative for diabetes mellitus and kidney disease.   FAMILY HISTORY:  Mother with leukemia, father with heart disease.  Brother  with heart disease.   SOCIAL HISTORY:  This is a divorced white female with two children.  She is  employed in an accounting position.  The patient denies alcohol use and  continues to smoke approximately one to five cigarettes per day.  She is  attempting to quit and at her heaviest smoked a pack and a half per day, and  she has smoked for 40 years.   PHYSICAL EXAMINATION:  VITAL SIGNS:  Blood pressure 124/70, pulse 100,  respirations 14.  GENERAL:  This is a 65 year old white female in no acute distress.  She is  alert and oriented x3.  HEENT:  Normocephalic, atraumatic.  Pupils equal, round, and reactive to  light and accommodation.  Extraocular movements intact.  There is no  evidence of cataracts, glaucoma, or macular degeneration.   NECK:  Supple with no JVD, no bruits, no lymphadenopathy.  CHEST:  Symmetrical on inspiration.  Lungs:  No wheezes, rhonchi, or rales.  CARDIAC:  Tachycardic with no murmurs, no rubs, no gallops.  ABDOMEN:  Soft, nontender.  There are bowel sounds auscultated in four  quadrants.  There are no masses, no bruits.  GENITOURINARY, RECTAL:  Deferred.  EXTREMITIES:  No clubbing, no cyanosis, no edema, no ulcerations.  Temperature is warm.  PULSES:  Carotid, femoral, popliteal, and posterior tibial are 2+  bilaterally.  NEUROLOGIC:  Nonfocal.  Gait is steady.  Deep tendon reflexes are 2+.  The  muscle strength is 5/5.   ASSESSMENT:  Right upper lobe lesion, non-small cell lung cancer.   PLAN:  Right thoracotomy with right upper lobectomy and rib resection at  [facility] on January 29, 2004.  Dr. Arlyce Dice has seen and evaluated  the patient prior to this admission and has explained the risks and benefits  of the procedure, and the patient has agreed to continue.      Leta Baptist, [place]                      Nicanor Alcon, M.D.    AY/MEDQ  D:  01/24/2004  T:  01/24/2004  Job:  [id number]   cc:   Lennette Bihari L. Little, M.D.  [address]  [place]  [place] [address]  Fax: [phone]   Christena Deem. Melvyn Novas, M.D. [facility]   Eston Esters, M.D.  [address] [place] [address]  Fax: [phone]

## 2010-11-01 NOTE — Op Note (Signed)
NAME:  Jessica Gilbert, Jessica Gilbert                         ACCOUNT NO.:  [id number]   MEDICAL RECORD NO.:  [id number]                   PATIENT TYPE:  INP   LOCATION:  2899                                 FACILITY:  [facility]   PHYSICIAN:  Nicanor Alcon, M.D.              DATE OF BIRTH:  Jun 11, 1946   DATE OF PROCEDURE:  01/29/2004  DATE OF DISCHARGE:                                 OPERATIVE REPORT   PREOPERATIVE DIAGNOSIS:  Right superior sulcus tumor.   POSTOPERATIVE DIAGNOSIS:  Right superior sulcus tumor.   OPERATION PERFORMED:  Right thoracotomy, resection of right upper lobe with  first and second ribs, node dissection.   SURGEON:  Nicanor Alcon, M.D.   ASSISTANT:  Leta Baptist, PA   ANESTHESIA:  General.   INDICATIONS FOR PROCEDURE:  This patient had had preoperative radiation and  chemotherapy for superior sulcus tumor involving the first and second ribs.  She was brought to the operating room for resection.  She had got a good 30  to 40% reduction with pretreatment.   DESCRIPTION OF PROCEDURE:  After general anesthesia, a dual lumen tube was  inserted.  She was turned to the right lateral thoracotomy position, was  prepped and draped in the usual sterile manner.  Two trocar sites were made  in the anterior and posterior axillary line at the seventh intercostal  space.  Posterolateral thoracotomy was made and carried down with  electrocautery through the subcutaneous tissue and fascia.  The latissimus  was only partially divided.  The serratus was reflected anteriorly and we  divided the rhomboids and trapezius posteriorly, taking the incision up  posteriorly.  The fourth intercostal space was entered, exploration was  carried out and the lesion could be found posteriorly on the first and  second ribs. I did not think we had to take the third rib, so the third  intercostal space was entered __________ , and dissection was carried  anteriorly up to the midaxillary line.   Then dissection was carried  posteriorly, dividing the paraspinous muscles and dissecting out the first  and second ribs down to the transverse process. Then the subclavian muscle  and the scalene anticus muscles were dissected off the second and third rib  insertions superiorly.  The second rib was divided with a rib shear and  dissection was carried up to the first rib which looped with a Gigli saw and  divided.  The muscle was taken off, taking care not rule out injure artery  or vein or nerve.  Nerve ran right underneath the first rib.  Attention was  then turned to the second rib and it was disarticulated between the  transverse process and then the rib was dissected down to remove it from its  vertebral insertion.  Then the pleura superiorly and inferiorly.  Attention  was turned to the first rib and it was again divided with the  [facility]-  [facility] periosteal elevators resecting it up and dissecting  down and you could see that the second T2 nerve root went right into the  tumor and this had to be sacrificed but care was made to preserve all the  __________, and this was only sacrificed right as it entered the cancer.  After this had been done, the apex was thus freed up.  Attention was turned  to the lobectomy.  We started posteriorly dissecting out some 10R and 11R  nodes, dissecting down the bronchus posteriorly.  Starting superiorly, the  apical posterior branch to the left upper lobe was dissected out but there  were several 4R and 10R nodes that had to be dissected free and one 6 node.  The apical posterior branch was divided with the Autosuture 30 white  reticulator  and then the superior pulmonary vein was dissected up and it  was divided with the Autosuture 390 white reticulator.  The attention was  then turned to the bronchus and it was dissected free, resecting out some  more 10R nodes.  It was stapled and divided.  It was stapled with a TL 30  stapler.  Then  that allowed [place] to divide the fissure with several  applications of the QQP-61 and EZ-45 stapler dividing both the minor and the  major fissure.  The right upper lobe was removed.  There was no air leak  under water. CoSeal was applied to the staple lines.  The inferior pulmonary  ligament was taken down.  Two chest tubes were brought it through the  inferior trocar sites and tied in place with 0 silk.  A Marcaine block was  done in the usual fashion.  The On-Q catheters were placed in the  paravertebral space in the usual fashion.  The chest was closed with five  pericostals, #1 Vicryl in the muscle layer, 2-0 Vicryl in the subcutaneous  tissue and Ethicon skin clips.  The patient tolerated the procedure well,  was returned to the recovery room in stable condition.                                               Nicanor Alcon, M.D.    DPB/MEDQ  D:  01/29/2004  T:  01/29/2004  Job:  [id number]   cc:   Sheral Apley Tammi Klippel, M.D.  [address] [address]  Fax: [phone]   Eston Esters, M.D.  [address] - [place] [address]  Fax: [phone]

## 2010-11-01 NOTE — Op Note (Signed)
NAME:  Jessica Gilbert, Jessica Gilbert                         ACCOUNT NO.:  [id number]   MEDICAL RECORD NO.:  [id number]                   PATIENT TYPE:  AMB   LOCATION:  CARD                                 FACILITY[facility]  [facility]   PHYSICIAN:  Legrand Como B. Melvyn Novas, M.D. [facility]           DATE OF BIRTH:  1946/05/07   DATE OF PROCEDURE:  08/30/2003  DATE OF DISCHARGE:                                 OPERATIVE REPORT   PROCEDURE:  Fiberoptic bronchoscopy with transbronchial biopsy of the right  lower lobe.   HISTORY:  Please see dictated pulmonary consultation note performed in the  office within  the last two weeks.  The patient presented with chronic  symptoms of cough with traces of hemoptysis, vague right-sided shoulder  pain, and a fluffy air space density in the right upper lobe posteriorly  that I was concerned might represent tuberculosis; however, her AFBs  returned x2 now and she agreed to the procedure for definitive tissue  diagnosis of this density after full discussion of the risks, benefits and  alternatives in the office.  Please see dictated office records for further  information.  Since the office evaluation w2as performed, there has been no  change on history or exam.   DESCRIPTION OF PROCEDURE:  The patient was premedicated with a total of 75  mg of IV Demerol and 7.5 mg of IV Versed for adequate sedation and cough  suppression.  She was also treated with 1% lidocaine by updraft nebulizer.   The right nare was easily cannulated using a standard flexible fiberoptic  bronchoscope.  I was able to visualize the cords well and the oropharynx and  larynx were inspected in detail with no evidence of an upper airway lesion  and she had normal vocal cord motion.   Using an additional 1% lidocaine as needed, the entire tracheobronchial tree  was explored bilaterally with the following findings:   The trachea, carina and all the airways bilaterally to the subsegmental  level opened widely with no  significant abnormality except for a few mucoid  secretions that were suctioned easily free.  No source of bleeding was  identified.   Using a wet position, therefore, within the right upper lobe posteriorly, I  was able to direct biopsy forceps by fluoroscopy into the area of the  densest infiltrate with two large specimens obtained.  Because the angle was  so severe, I did not attempt to produce additional biopsies, and also lavage  the right upper lobe posteriorly.   The patient tolerated the it well with followup chest x-ray pending.   We will send her home on Vicodin one to two q.4h. p.r.n. chest discomfort  for excessive cough and awaiting tissue diagnosis.  Christena Deem. Melvyn Novas, M.D. [facility]    MBW/MEDQ  D:  08/30/2003  T:  08/31/2003  Job:  [id number]   cc:   Lennette Bihari L. Little, M.D.  [address]  [place]  [place] [address]  Fax: [phone]

## 2012-01-08 ENCOUNTER — Other Ambulatory Visit: Payer: Self-pay | Admitting: Family Medicine

## 2012-01-08 DIAGNOSIS — Z1231 Encounter for screening mammogram for malignant neoplasm of breast: Secondary | ICD-10-CM

## 2012-02-05 ENCOUNTER — Ambulatory Visit
Admission: RE | Admit: 2012-02-05 | Discharge: 2012-02-05 | Disposition: A | Discharge status: Home / Self Care | Payer: PRIVATE HEALTH INSURANCE | Source: Ambulatory Visit | Attending: Family Medicine | Admitting: Family Medicine

## 2012-02-05 DIAGNOSIS — Z1231 Encounter for screening mammogram for malignant neoplasm of breast: Secondary | ICD-10-CM

## 2012-02-19 ENCOUNTER — Other Ambulatory Visit: Payer: Self-pay | Admitting: Family Medicine

## 2012-02-19 ENCOUNTER — Other Ambulatory Visit (HOSPITAL_COMMUNITY)
Admission: RE | Admit: 2012-02-19 | Discharge: 2012-02-19 | Disposition: A | Discharge status: Home / Self Care | Payer: PRIVATE HEALTH INSURANCE | Source: Ambulatory Visit | Attending: Family Medicine | Admitting: Family Medicine

## 2012-02-19 DIAGNOSIS — Z1151 Encounter for screening for human papillomavirus (HPV): Secondary | ICD-10-CM | POA: Insufficient documentation

## 2012-02-19 DIAGNOSIS — I6529 Occlusion and stenosis of unspecified carotid artery: Secondary | ICD-10-CM

## 2012-02-19 DIAGNOSIS — Z124 Encounter for screening for malignant neoplasm of cervix: Secondary | ICD-10-CM | POA: Insufficient documentation

## 2012-03-18 ENCOUNTER — Ambulatory Visit
Admission: RE | Admit: 2012-03-18 | Discharge: 2012-03-18 | Disposition: A | Discharge status: Home / Self Care | Payer: PRIVATE HEALTH INSURANCE | Source: Ambulatory Visit | Attending: Family Medicine | Admitting: Family Medicine

## 2012-03-18 DIAGNOSIS — I6529 Occlusion and stenosis of unspecified carotid artery: Secondary | ICD-10-CM

## 2013-02-08 ENCOUNTER — Other Ambulatory Visit: Payer: Self-pay

## 2013-02-08 DIAGNOSIS — Z1231 Encounter for screening mammogram for malignant neoplasm of breast: Secondary | ICD-10-CM

## 2013-03-04 ENCOUNTER — Ambulatory Visit
Admission: RE | Admit: 2013-03-04 | Discharge: 2013-03-04 | Disposition: A | Discharge status: Home / Self Care | Payer: PRIVATE HEALTH INSURANCE | Source: Ambulatory Visit

## 2013-03-04 DIAGNOSIS — Z1231 Encounter for screening mammogram for malignant neoplasm of breast: Secondary | ICD-10-CM

## 2013-08-14 DIAGNOSIS — J123 Human metapneumovirus pneumonia: Secondary | ICD-10-CM

## 2013-08-14 HISTORY — DX: Human metapneumovirus pneumonia: J12.3

## 2013-08-28 ENCOUNTER — Inpatient Hospital Stay (HOSPITAL_COMMUNITY)
Admission: EM | Admit: 2013-08-28 | Discharge: 2013-09-04 | DRG: 193 | Disposition: A | Discharge status: Home / Self Care | Payer: Managed Care, Other (non HMO) | Attending: Internal Medicine | Admitting: Internal Medicine

## 2013-08-28 ENCOUNTER — Encounter (HOSPITAL_COMMUNITY): Payer: Self-pay | Admitting: Emergency Medicine

## 2013-08-28 ENCOUNTER — Emergency Department (HOSPITAL_COMMUNITY): Payer: Managed Care, Other (non HMO)

## 2013-08-28 DIAGNOSIS — R0603 Acute respiratory distress: Secondary | ICD-10-CM

## 2013-08-28 DIAGNOSIS — D72829 Elevated white blood cell count, unspecified: Secondary | ICD-10-CM | POA: Diagnosis present

## 2013-08-28 DIAGNOSIS — Z902 Acquired absence of lung [part of]: Secondary | ICD-10-CM

## 2013-08-28 DIAGNOSIS — Z880 Allergy status to penicillin: Secondary | ICD-10-CM

## 2013-08-28 DIAGNOSIS — J123 Human metapneumovirus pneumonia: Secondary | ICD-10-CM

## 2013-08-28 DIAGNOSIS — Z85118 Personal history of other malignant neoplasm of bronchus and lung: Secondary | ICD-10-CM

## 2013-08-28 DIAGNOSIS — J441 Chronic obstructive pulmonary disease with (acute) exacerbation: Secondary | ICD-10-CM | POA: Diagnosis present

## 2013-08-28 DIAGNOSIS — I10 Essential (primary) hypertension: Secondary | ICD-10-CM

## 2013-08-28 DIAGNOSIS — Z87891 Personal history of nicotine dependence: Secondary | ICD-10-CM

## 2013-08-28 DIAGNOSIS — J45901 Unspecified asthma with (acute) exacerbation: Secondary | ICD-10-CM

## 2013-08-28 DIAGNOSIS — Z9221 Personal history of antineoplastic chemotherapy: Secondary | ICD-10-CM

## 2013-08-28 DIAGNOSIS — J989 Respiratory disorder, unspecified: Secondary | ICD-10-CM

## 2013-08-28 DIAGNOSIS — J189 Pneumonia, unspecified organism: Principal | ICD-10-CM | POA: Diagnosis present

## 2013-08-28 DIAGNOSIS — Z79899 Other long term (current) drug therapy: Secondary | ICD-10-CM

## 2013-08-28 DIAGNOSIS — J9600 Acute respiratory failure, unspecified whether with hypoxia or hypercapnia: Secondary | ICD-10-CM | POA: Diagnosis present

## 2013-08-28 HISTORY — DX: Celiac disease: K90.0

## 2013-08-28 HISTORY — DX: Acute respiratory failure, unspecified whether with hypoxia or hypercapnia: J96.00

## 2013-08-28 LAB — CBC WITH DIFFERENTIAL/PLATELET
BASOS PCT: 0 % (ref 0–1)
Basophils Absolute: 0 10*3/uL (ref 0.0–0.1)
EOS ABS: 0 10*3/uL (ref 0.0–0.7)
Eosinophils Relative: 0 % (ref 0–5)
HEMATOCRIT: 41.1 % (ref 36.0–46.0)
HEMOGLOBIN: 14 g/dL (ref 12.0–15.0)
LYMPHS ABS: 1.9 10*3/uL (ref 0.7–4.0)
Lymphocytes Relative: 15 % (ref 12–46)
MCH: 28.6 pg (ref 26.0–34.0)
MCHC: 34.1 g/dL (ref 30.0–36.0)
MCV: 84 fL (ref 78.0–100.0)
MONO ABS: 1.3 10*3/uL — AB (ref 0.1–1.0)
MONOS PCT: 10 % (ref 3–12)
Neutro Abs: 9.6 10*3/uL — ABNORMAL HIGH (ref 1.7–7.7)
Neutrophils Relative %: 75 % (ref 43–77)
Platelets: 301 10*3/uL (ref 150–400)
RBC: 4.89 MIL/uL (ref 3.87–5.11)
RDW: 14.1 % (ref 11.5–15.5)
WBC: 12.8 10*3/uL — ABNORMAL HIGH (ref 4.0–10.5)

## 2013-08-28 LAB — COMPREHENSIVE METABOLIC PANEL
ALBUMIN: 3.6 g/dL (ref 3.5–5.2)
ALK PHOS: 102 U/L (ref 39–117)
ALT: 28 U/L (ref 0–35)
AST: 41 U/L — ABNORMAL HIGH (ref 0–37)
BILIRUBIN TOTAL: 0.4 mg/dL (ref 0.3–1.2)
BUN: 9 mg/dL (ref 6–23)
CHLORIDE: 93 meq/L — AB (ref 96–112)
CO2: 26 mEq/L (ref 19–32)
CREATININE: 0.66 mg/dL (ref 0.50–1.10)
Calcium: 9.4 mg/dL (ref 8.4–10.5)
GFR calc non Af Amer: >90 mL/min (ref >90)
GLUCOSE: 158 mg/dL — AB (ref 70–99)
Potassium: 4.2 mEq/L (ref 3.7–5.3)
Sodium: 133 mEq/L — ABNORMAL LOW (ref 137–147)
TOTAL PROTEIN: 7.3 g/dL (ref 6.0–8.3)

## 2013-08-28 LAB — MRSA PCR SCREENING: MRSA by PCR: NEGATIVE

## 2013-08-28 LAB — STREP PNEUMONIAE URINARY ANTIGEN: Strep Pneumo Urinary Antigen: NEGATIVE

## 2013-08-28 LAB — HIV ANTIBODY (ROUTINE TESTING W REFLEX): HIV: NONREACTIVE

## 2013-08-28 LAB — TROPONIN I

## 2013-08-28 LAB — I-STAT CG4 LACTIC ACID, ED: LACTIC ACID, VENOUS: 1.78 mmol/L (ref 0.5–2.2)

## 2013-08-28 LAB — PRO B NATRIURETIC PEPTIDE: Pro B Natriuretic peptide (BNP): 118.5 pg/mL (ref 0–125)

## 2013-08-28 MED ORDER — ACETAMINOPHEN 650 MG RE SUPP
650.0000 mg | Freq: Four times a day (QID) | RECTAL | Status: DC | PRN
Start: 2013-08-28 — End: 2013-09-04

## 2013-08-28 MED ORDER — DEXTROSE 5 % IV SOLN
500.0000 mg | INTRAVENOUS | Status: DC
  Administered 2013-08-28 – 2013-08-30 (×3): 500 mg via INTRAVENOUS
  Filled 2013-08-28 (×5): qty 500

## 2013-08-28 MED ORDER — IRBESARTAN 75 MG PO TABS
75.0000 mg | ORAL_TABLET | Freq: Every day | ORAL | Status: DC
  Administered 2013-08-30 – 2013-09-04 (×6): 75 mg via ORAL
  Filled 2013-08-28 (×7): qty 1

## 2013-08-28 MED ORDER — ACETAMINOPHEN 325 MG PO TABS: 650.0000 mg | ORAL_TABLET | Freq: Four times a day (QID) | ORAL | Status: DC | PRN

## 2013-08-28 MED ORDER — SENNOSIDES-DOCUSATE SODIUM 8.6-50 MG PO TABS
1.0000 | ORAL_TABLET | Freq: Every evening | ORAL | Status: DC | PRN
  Administered 2013-08-31: 1 via ORAL
  Filled 2013-08-28: qty 1

## 2013-08-28 MED ORDER — ALBUTEROL (5 MG/ML) CONTINUOUS INHALATION SOLN
15.0000 mg/h | INHALATION_SOLUTION | Freq: Once | RESPIRATORY_TRACT | Status: AC
  Administered 2013-08-28: 15 mg/h via RESPIRATORY_TRACT
  Filled 2013-08-28: qty 20

## 2013-08-28 MED ORDER — ONDANSETRON HCL 4 MG/2ML IJ SOLN: 4.0000 mg | Freq: Four times a day (QID) | INTRAMUSCULAR | Status: DC | PRN

## 2013-08-28 MED ORDER — DEXTROSE 5 % IV SOLN
1.0000 g | INTRAVENOUS | Status: DC
  Administered 2013-08-28 – 2013-08-30 (×3): 1 g via INTRAVENOUS
  Filled 2013-08-28 (×4): qty 10

## 2013-08-28 MED ORDER — IPRATROPIUM-ALBUTEROL 0.5-2.5 (3) MG/3ML IN SOLN
3.0000 mL | RESPIRATORY_TRACT | Status: DC
  Administered 2013-08-29 (×2): 3 mL via RESPIRATORY_TRACT
  Filled 2013-08-28 (×2): qty 3

## 2013-08-28 MED ORDER — ENOXAPARIN SODIUM 40 MG/0.4ML ~~LOC~~ SOLN
40.0000 mg | SUBCUTANEOUS | Status: DC
  Administered 2013-09-03: 40 mg via SUBCUTANEOUS
  Filled 2013-08-28 (×8): qty 0.4

## 2013-08-28 MED ORDER — IPRATROPIUM-ALBUTEROL 0.5-2.5 (3) MG/3ML IN SOLN
3.0000 mL | RESPIRATORY_TRACT | Status: DC | PRN
  Administered 2013-08-28: 3 mL via RESPIRATORY_TRACT

## 2013-08-28 MED ORDER — ONDANSETRON HCL 4 MG PO TABS: 4.0000 mg | ORAL_TABLET | Freq: Four times a day (QID) | ORAL | Status: DC | PRN

## 2013-08-28 MED ORDER — IPRATROPIUM-ALBUTEROL 0.5-2.5 (3) MG/3ML IN SOLN
3.0000 mL | RESPIRATORY_TRACT | Status: DC
  Administered 2013-08-28: 3 mL via RESPIRATORY_TRACT
  Filled 2013-08-28: qty 3

## 2013-08-28 MED ORDER — ADULT MULTIVITAMIN W/MINERALS CH
1.0000 | ORAL_TABLET | Freq: Every day | ORAL | Status: DC
  Administered 2013-08-28 – 2013-09-04 (×8): 1 via ORAL
  Filled 2013-08-28 (×8): qty 1

## 2013-08-28 MED ORDER — ALBUTEROL SULFATE (2.5 MG/3ML) 0.083% IN NEBU
5.0000 mg | INHALATION_SOLUTION | RESPIRATORY_TRACT | Status: DC | PRN
  Administered 2013-08-28: 5 mg via RESPIRATORY_TRACT
  Filled 2013-08-28: qty 6

## 2013-08-28 MED ORDER — IPRATROPIUM-ALBUTEROL 0.5-2.5 (3) MG/3ML IN SOLN
RESPIRATORY_TRACT | Status: AC
  Filled 2013-08-28: qty 3

## 2013-08-28 MED ORDER — METHYLPREDNISOLONE SODIUM SUCC 125 MG IJ SOLR
125.0000 mg | Freq: Once | INTRAMUSCULAR | Status: AC
  Administered 2013-08-28: 125 mg via INTRAVENOUS
  Filled 2013-08-28: qty 2

## 2013-08-28 MED ORDER — IPRATROPIUM BROMIDE 0.02 % IN SOLN
0.5000 mg | Freq: Once | RESPIRATORY_TRACT | Status: AC
  Administered 2013-08-28: 0.5 mg via RESPIRATORY_TRACT
  Filled 2013-08-28: qty 2.5

## 2013-08-28 MED ORDER — ALBUTEROL (5 MG/ML) CONTINUOUS INHALATION SOLN
10.0000 mg/h | INHALATION_SOLUTION | RESPIRATORY_TRACT | Status: DC
Start: 2013-08-28 — End: 2013-08-28

## 2013-08-28 MED ORDER — SODIUM CHLORIDE 0.9 % IV SOLN
INTRAVENOUS | Status: DC
  Administered 2013-08-28: 100 mL/h via INTRAVENOUS

## 2013-08-28 MED ORDER — LEVOFLOXACIN IN D5W 750 MG/150ML IV SOLN
750.0000 mg | Freq: Once | INTRAVENOUS | Status: AC
  Administered 2013-08-28: 750 mg via INTRAVENOUS
  Filled 2013-08-28: qty 150

## 2013-08-28 MED ORDER — SODIUM CHLORIDE 0.9 % IV SOLN
INTRAVENOUS | Status: DC
  Administered 2013-08-29: 1000 mL via INTRAVENOUS
  Administered 2013-08-29: 09:00:00 via INTRAVENOUS
  Administered 2013-08-30: 1000 mL via INTRAVENOUS

## 2013-08-28 NOTE — H&P (Signed)
[facility]          History and Physical    PCP:   No primary provider on file.   Chief Complaint:  Shortness of breath  HPI: Patient is a pleasant 68 year old white woman with past medical history significant only for hypertension and prior lung cancer for which she underwent a right upper lobe lobectomy. who presents to the hospital today with shortness of breath that has been ongoing for about 2-3 days. She feels like her nose has been congested and she has had a sore throat for that time frame. Denies any fevers or chills. In the hospital she was noted to be tachypneic into the mid to high 30s as well as tachycardic and was initially placed on noninvasive positive pressure ventilation and was subsequently able to be transitioned to nasal cannula oxygen. Chest x-ray shows evidence for pneumonia. We have been asked to admit her for further evaluation and management.   Allergies:  No Known Allergies    Past Medical History  Diagnosis Date  . Cancer     lung  . Acute respiratory failure     Past Surgical History  Procedure Laterality Date  . Lung removal, partial Right upper right    Prior to Admission medications   Medication Sig Start Date End Date Taking? Authorizing Provider  albuterol (PROVENTIL HFA;VENTOLIN HFA) 108 (90 BASE) MCG/ACT inhaler Inhale 2 puffs into the lungs every 6 (six) hours as needed for wheezing or shortness of breath.   Yes Historical Provider, MD  albuterol (PROVENTIL) (2.5 MG/3ML) 0.083% nebulizer solution Take 2.5 mg by nebulization every 6 (six) hours as needed for wheezing or shortness of breath.   Yes Historical Provider, MD  ibuprofen (ADVIL,MOTRIN) 200 MG tablet Take 200 mg by mouth every 6 (six) hours as needed.   Yes Historical Provider, MD  ipratropium (ATROVENT) 0.02 % nebulizer solution Take 0.5 mg by nebulization 4 (four) times daily.   Yes Historical Provider, MD  loratadine-pseudoephedrine (CLARITIN-D 12-HOUR) 5-120 MG  per tablet Take 1 tablet by mouth 2 (two) times daily.   Yes Historical Provider, MD  Multiple Vitamin (MULTIVITAMIN WITH MINERALS) TABS tablet Take 1 tablet by mouth daily.   Yes Historical Provider, MD  telmisartan (MICARDIS) 40 MG tablet Take 40 mg by mouth daily.   Yes Historical Provider, MD    Social History:  reports that she has never smoked. She does not have any smokeless tobacco history on file. She reports that she does not drink alcohol. Her drug history is not on file.  No family history on file.  Review of Systems:  Constitutional: Denies fever, chills, diaphoresis, appetite change and fatigue.  HEENT: Denies photophobia, eye pain, redness, hearing loss, ear pain, congestion, sore throat, rhinorrhea, sneezing, mouth sores, trouble swallowing, neck pain, neck stiffness and tinnitus.   Respiratory: Denies cough, chest tightness,  and wheezing.   Cardiovascular: Denies chest pain, palpitations and leg swelling.  Gastrointestinal: Denies nausea, vomiting, abdominal pain, diarrhea, constipation, blood in stool and abdominal distention.  Genitourinary: Denies dysuria, urgency, frequency, hematuria, flank pain and difficulty urinating.  Endocrine: Denies: hot or cold intolerance, sweats, changes in hair or nails, polyuria, polydipsia. Musculoskeletal: Denies myalgias, back pain, joint swelling, arthralgias and gait problem.  Skin: Denies pallor, rash and wound.  Neurological: Denies dizziness, seizures, syncope, weakness, light-headedness, numbness and headaches.  Hematological: Denies adenopathy. Easy bruising, personal or family bleeding history  Psychiatric/Behavioral: Denies suicidal ideation, mood changes, confusion, nervousness, sleep disturbance and agitation  Physical Exam: Blood pressure 132/61, pulse 111, temperature 98.1 F (36.7 C), temperature source Oral, resp. rate 20, SpO2 92.00%.  general: Alert, awake, oriented x3, mild to moderate respiratory distress. HEENT:  Normocephalic, atraumatic, pupils equal and reactive to light, extraocular movements intact. Neck: Supple, no JVD, no lymphadenopathy, no bruits, no goiter. Cardiovascular: Regular rate and rhythm, no murmurs, rubs or gallops. Lungs: Clear to auscultation bilaterally, no wheezing. Abdomen: Soft, nontender, nondistended, positive bowel sounds, no masses or organomegaly noted. Extremities: No clubbing, cyanosis or edema, positive pulses. Neurologic: Grossly intact and nonfocal  Labs on Admission:  Results for orders placed during the hospital encounter of 08/28/13 (from the past 48 hour(s))  CBC WITH DIFFERENTIAL     Status: Abnormal   Collection Time    08/28/13  9:37 AM      Result Value Ref Range   WBC 12.8 (*) 4.0 - 10.5 K/uL   RBC 4.89  3.87 - 5.11 MIL/uL   Hemoglobin 14.0  12.0 - 15.0 g/dL   HCT 41.1  36.0 - 46.0 %   MCV 84.0  78.0 - 100.0 fL   MCH 28.6  26.0 - 34.0 pg   MCHC 34.1  30.0 - 36.0 g/dL   RDW 14.1  11.5 - 15.5 %   Platelets 301  150 - 400 K/uL   Neutrophils Relative % 75  43 - 77 %   Neutro Abs 9.6 (*) 1.7 - 7.7 K/uL   Lymphocytes Relative 15  12 - 46 %   Lymphs Abs 1.9  0.7 - 4.0 K/uL   Monocytes Relative 10  3 - 12 %   Monocytes Absolute 1.3 (*) 0.1 - 1.0 K/uL   Eosinophils Relative 0  0 - 5 %   Eosinophils Absolute 0.0  0.0 - 0.7 K/uL   Basophils Relative 0  0 - 1 %   Basophils Absolute 0.0  0.0 - 0.1 K/uL  COMPREHENSIVE METABOLIC PANEL     Status: Abnormal   Collection Time    08/28/13  9:37 AM      Result Value Ref Range   Sodium 133 (*) 137 - 147 mEq/L   Potassium 4.2  3.7 - 5.3 mEq/L   Chloride 93 (*) 96 - 112 mEq/L   CO2 26  19 - 32 mEq/L   Glucose, Bld 158 (*) 70 - 99 mg/dL   BUN 9  6 - 23 mg/dL   Creatinine, Ser 0.66  0.50 - 1.10 mg/dL   Calcium 9.4  8.4 - 10.5 mg/dL   Total Protein 7.3  6.0 - 8.3 g/dL   Albumin 3.6  3.5 - 5.2 g/dL   AST 41 (*) 0 - 37 U/L   ALT 28  0 - 35 U/L   Alkaline Phosphatase 102  39 - 117 U/L   Total Bilirubin 0.4   0.3 - 1.2 mg/dL   GFR calc non Af Amer >90  >90 mL/min   GFR calc Af Amer >90  >90 mL/min   Comment: (NOTE)     The eGFR has been calculated using the CKD EPI equation.     This calculation has not been validated in all clinical situations.     eGFR's persistently <90 mL/min signify possible Chronic Kidney     Disease.  TROPONIN I     Status: None   Collection Time    08/28/13  9:37 AM      Result Value Ref Range   Troponin I <0.30  <0.30 ng/mL   Comment:              Due to the release kinetics of cTnI,     a negative result within the first hours     of the onset of symptoms does not rule out     myocardial infarction with certainty.     If myocardial infarction is still suspected,     repeat the test at appropriate intervals.  PRO B NATRIURETIC PEPTIDE     Status: None   Collection Time    08/28/13  9:37 AM      Result Value Ref Range   Pro B Natriuretic peptide (BNP) 118.5  0 - 125 pg/mL  I-STAT CG4 LACTIC ACID, ED     Status: None   Collection Time    08/28/13  9:48 AM      Result Value Ref Range   Lactic Acid, Venous 1.78  0.5 - 2.2 mmol/L    Radiological Exams on Admission: Dg Chest Portable 1 View  08/28/2013   CLINICAL DATA:  Respiratory distress  EXAM: PORTABLE CHEST - 1 VIEW  COMPARISON:  05/11/2013  FINDINGS: Cardiac shadow is stable. The lungs are well aerated bilaterally. Patchy infiltrative density is noted in the right lung base laterally. No other focal infiltrate is seen.  IMPRESSION: Right basilar infiltrate.   Electronically Signed   By: Inez Catalina M.D.   On: 08/28/2013 10:04    Assessment/Plan Principal Problem:   Acute respiratory failure Active Problems:   Respiratory distress   CAP (community acquired pneumonia)   HTN (hypertension)   Leukocytosis, unspecified    Acute respiratory failure -Likely secondary to community-acquired pneumonia. -See below for details. -Will admit to step down as she required BiPAP in the emergency department. She  still appears tachypneic and tachycardic so she may need to go back on the BiPAP at some point during the night.  Community-acquired pneumonia -Was given a dose of Levaquin in the emergency department. -Start Rocephin/azithromycin. -Check blood/sputum cultures. -Strep pneumo/Legionella urine antigen. -Check influenza PCR; given her degree of respiratory distress will treat empirically with Tamiflu. Can discontinue it PCR is negative.  Leukocytosis -Secondary to pneumonia. -Follow with antibiotics.  Hypertension -Continue in therapy.  DVT prophylaxis -Lovenox  CODE STATUS -Full code   Time Spent on Admission:  75 minutes  HERNANDEZ ACOSTA,Aiyana Stegmann [facility] Pager: [phone] 08/28/2013, 3:29 PM

## 2013-08-28 NOTE — Progress Notes (Signed)
Patient removed from [place] and placed on [place] 4lpm. Breathing is much less labored with fewer retractions and less wheeze. Will continue to monitor

## 2013-08-28 NOTE — ED Notes (Signed)
Pt is having SOB, has hx of acute respiratory failure and borderline COPD. States she started sounding very rhonchi last night , gotten worse over night.

## 2013-08-28 NOTE — ED Provider Notes (Signed)
CSN: [id number]     Arrival date & time 08/28/13  0930 History   First MD Initiated Contact with Patient 08/28/13 [phone]     Chief Complaint  Patient presents with  . Shortness of Breath   Level 5 Caveat for respiratory distress  (Consider location/radiation/quality/duration/timing/severity/associated sxs/prior Treatment) HPI Patient reports emergency department in respiratory distress. She states she started having URI symptoms 6 days ago with cough that was nonproductive. She did have fever about 4 days ago. She denies chest pain but states she's very short of breath and she's having wheezing. She states when she uses her Proventil inhaler it seems to help. However last night her shortness of breath got a lot worse.  PCP Dr. [name]  Past Medical History  Diagnosis Date  . Cancer     lung  . Acute respiratory failure    Past Surgical History  Procedure Laterality Date  . Lung removal, partial Right upper right   No family history on file. History  Substance Use Topics  . Smoking status: Never Smoker   . Smokeless tobacco: Not on file  . 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  Home Medications   Current Outpatient Rx  Name  Route  Sig  Dispense  Refill  . albuterol (PROVENTIL HFA;VENTOLIN HFA) 108 (90 BASE) MCG/ACT inhaler   Inhalation   Inhale 2 puffs into the lungs every 6 (six) hours as needed for wheezing or shortness of breath.         Marland Kitchen albuterol (PROVENTIL) (2.5 MG/3ML) 0.083% nebulizer solution   Nebulization   Take 2.5 mg by nebulization every 6 (six) hours as needed for wheezing or shortness of breath.         Marland Kitchen ibuprofen (ADVIL,MOTRIN) 200 MG tablet   Oral   Take 200 mg by  mouth every 6 (six) hours as needed.         Marland Kitchen ipratropium (ATROVENT) 0.02 % nebulizer solution   Nebulization   Take 0.5 mg by nebulization 4 (four) times daily.         Marland Kitchen loratadine-pseudoephedrine (CLARITIN-D 12-HOUR) 5-120 MG per tablet   Oral   Take 1 tablet by mouth 2 (two) times daily.         . Multiple Vitamin (MULTIVITAMIN WITH MINERALS) TABS tablet   Oral   Take 1 tablet by mouth daily.         Marland Kitchen telmisartan (MICARDIS) 40 MG tablet   Oral   Take 40 mg by mouth daily.          BP 99/62  Pulse 117  Resp 33  SpO2 94%   Vital signs normal except tachycardia and borderline hypotension   Physical Exam  Nursing note and vitals reviewed. Constitutional: She is oriented to person, place, and time. She appears well-developed and well-nourished.  Non-toxic appearance. She does not appear ill. She appears distressed.  HENT:  Head: Normocephalic and atraumatic.  Right Ear: External ear normal.  Left Ear: External ear normal.  Nose: Nose normal. No mucosal edema or rhinorrhea.  Mouth/Throat: Oropharynx is clear and moist and mucous membranes  are normal. No dental abscesses or uvula swelling.  Eyes: Conjunctivae and EOM are normal. Pupils are equal, round, and reactive to light.  Neck: Normal range of motion and full passive range of motion without pain. Neck supple.  Cardiovascular: Normal rate, regular rhythm and normal heart sounds.  Exam reveals no gallop and no friction rub.   No murmur heard. Pulmonary/Chest: Accessory muscle usage present. Tachypnea noted. She is in respiratory distress. She has decreased breath sounds. She has wheezes. She has no rhonchi. She has rales. She exhibits no tenderness and no crepitus.  Pt has audible rales. She has diffuse wheezing in her lungs. She has supraclavicular retractions.   Abdominal: Soft. Normal appearance and bowel sounds are normal. She exhibits no distension. There is no tenderness. There is no rebound and no  guarding.  Musculoskeletal: Normal range of motion. She exhibits no edema and no tenderness.  Moves all extremities well.   Neurological: She is alert and oriented to person, place, and time. She has normal strength. No cranial nerve deficit.  Skin: Skin is warm, dry and intact. No rash noted. No erythema. No pallor.  Psychiatric: She has a normal mood and affect. Her speech is normal and behavior is normal. Her mood appears not anxious.    ED Course  Procedures (including critical care time)  Medications  albuterol (PROVENTIL,VENTOLIN) solution continuous neb (15 mg/hr Nebulization Given 08/28/13 0945)  ipratropium (ATROVENT) nebulizer solution 0.5 mg (0.5 mg Nebulization Given 08/28/13 0945)  methylPREDNISolone sodium succinate (SOLU-MEDROL) 125 mg/2 mL injection 125 mg (125 mg Intravenous Given 08/28/13 1011)  levofloxacin (LEVAQUIN) IVPB 750 mg (750 mg Intravenous New Bag/Given 08/28/13 1016)    Review of her old chart shows in 2005 she was diagnosed with a non-small cell lung cancer in her right upper lobe described as a Pancoast tumor. She had radiation and chemotherapy and then had a right upper lobe lobectomy.   Patient started on BiPAP due to to her respiratory distress and retractions. She was also started on a continuous nebulizer of albuterol and Atrovent  9:55 AM. Patient has been started on her continuous nebulizer.  10:17 discussed xray results. Pt started on Levaquin for her CAP, she appears more comfortable on the BiPAP. She was reexamined and had less wheezing, and her retractions are improving.  10:50 on reexam patient appears to be very comfortable. She has diffuse low pitched wheezing. She indicates she would like to try being off the BiPAP. bipap removed a restaurant therapy.  11:12 recheck, patient has some increased work of breathing which patient admits. However she does not want to go back on BiPAP yet. Son states with her respiratory failure last year she was not  treated with bipap or a ventilator, just Pulcifer oxygen and breathing treatments. She was in the hospital at [facility] for about a week.  11:52 Dr Jerilee Hoh, admit to step-down, team 8  Labs Review   Results for orders placed during the hospital encounter of 08/28/13  CBC WITH DIFFERENTIAL      Result Value Ref Range   WBC 12.8 (*) 4.0 - 10.5 K/uL   RBC 4.89  3.87 - 5.11 MIL/uL   Hemoglobin 14.0  12.0 - 15.0 g/dL   HCT 41.1  36.0 - 46.0 %   MCV 84.0  78.0 - 100.0 fL   MCH 28.6  26.0 - 34.0 pg   MCHC 34.1  30.0 - 36.0 g/dL   RDW 14.1  11.5 - 15.5 %   Platelets 301  150 - 400 K/uL   Neutrophils Relative % 75  43 - 77 %   Neutro Abs 9.6 (*) 1.7 - 7.7 K/uL   Lymphocytes Relative 15  12 - 46 %   Lymphs Abs 1.9  0.7 - 4.0 K/uL   Monocytes Relative 10  3 - 12 %   Monocytes Absolute 1.3 (*) 0.1 - 1.0 K/uL   Eosinophils Relative 0  0 - 5 %   Eosinophils Absolute 0.0  0.0 - 0.7 K/uL   Basophils Relative 0  0 - 1 %   Basophils Absolute 0.0  0.0 - 0.1 K/uL  COMPREHENSIVE METABOLIC PANEL      Result Value Ref Range   Sodium 133 (*) 137 - 147 mEq/L   Potassium 4.2  3.7 - 5.3 mEq/L   Chloride 93 (*) 96 - 112 mEq/L   CO2 26  19 - 32 mEq/L   Glucose, Bld 158 (*) 70 - 99 mg/dL   BUN 9  6 - 23 mg/dL   Creatinine, Ser 0.66  0.50 - 1.10 mg/dL   Calcium 9.4  8.4 - 10.5 mg/dL   Total Protein 7.3  6.0 - 8.3 g/dL   Albumin 3.6  3.5 - 5.2 g/dL   AST 41 (*) 0 - 37 U/L   ALT 28  0 - 35 U/L   Alkaline Phosphatase 102  39 - 117 U/L   Total Bilirubin 0.4  0.3 - 1.2 mg/dL   GFR calc non Af Amer >90  >90 mL/min   GFR calc Af Amer >90  >90 mL/min  TROPONIN I      Result Value Ref Range   Troponin I <0.30  <0.30 ng/mL  PRO B NATRIURETIC PEPTIDE      Result Value Ref Range   Pro B Natriuretic peptide (BNP) 118.5  0 - 125 pg/mL  I-STAT CG4 LACTIC ACID, ED      Result Value Ref Range   Lactic Acid, Venous 1.78  0.5 - 2.2 mmol/L   Laboratory interpretation all normal except for leukocytosis,  hypo-natremia and low chloride consistent with dehydration       Results for orders placed in visit on 11/21/09  CBC WITH DIFFERENTIAL      Result Value Ref Range   WBC 6.4  3.9 - 10.3 10e3/uL   NEUT# 3.6  1.5 - 6.5 10e3/uL   HGB 13.6  11.6 - 15.9 g/dL   HCT 40.1  34.8 - 46.6 %   Platelets 285  145 - 400 10e3/uL   MCV 89.3  79.5 - 101.0 fL   MCH 30.4  25.1 - 34.0 pg   MCHC 34.0  31.5 - 36.0 g/dL   RBC 4.49  3.70 - 5.45 10e6/uL   RDW 13.5  11.2 - 14.5 %   lymph# 2.1  0.9 - 3.3 10e3/uL   MONO# 0.7  0.1 - 0.9 10e3/uL   Eosinophils Absolute 0.1  0.0 - 0.5 10e3/uL   Basophils Absolute 0.0  0.0 - 0.1 10e3/uL   NEUT% 55.3  38.4 - 76.8 %   LYMPH% 31.9  14.0 - 49.7 %   MONO% 11.1  0.0 - 14.0 %   EOS% 1.4  0.0 - 7.0 %   BASO% 0.3  0.0 - 2.0 %  COMPREHENSIVE METABOLIC PANEL      Result Value Ref Range   Sodium 139  135 - 145 mEq/L   Potassium 4.8  3.5 - 5.3 mEq/L   Chloride 102  96 - 112 mEq/L  CO2 25  19 - 32 mEq/L   Glucose, Bld 92  70 - 99 mg/dL   BUN 13  6 - 23 mg/dL   Creatinine, Ser 0.83  0.40 - 1.20 mg/dL   Total Bilirubin 0.4  0.3 - 1.2 mg/dL   Alkaline Phosphatase 56  39 - 117 U/L   AST 23  0 - 37 U/L   ALT 18  0 - 35 U/L   Total Protein 7.2  6.0 - 8.3 g/dL   Albumin 4.5  3.5 - 5.2 g/dL   Calcium 9.8  8.4 - 10.5 mg/dL  LACTATE DEHYDROGENASE      Result Value Ref Range   LDH 149  94 - 250 U/L  CEA      Result Value Ref Range   CEA 2.8  0.0 - 5.0 ng/mL   No results found.    Imaging Review Dg Chest Portable 1 View  08/28/2013   CLINICAL DATA:  Respiratory distress  EXAM: PORTABLE CHEST - 1 VIEW  COMPARISON:  05/11/2013  FINDINGS: Cardiac shadow is stable. The lungs are well aerated bilaterally. Patchy infiltrative density is noted in the right lung base laterally. No other focal infiltrate is seen.  IMPRESSION: Right basilar infiltrate.   Electronically Signed   By: Inez Catalina M.D.   On: 08/28/2013 10:04     EKG Interpretation   Date/Time:  Sunday August 28 2013 09:40:19 EDT Ventricular Rate:  116 PR Interval:  124 QRS Duration: 83 QT Interval:  321 QTC Calculation: 446 R Axis:   92 Text Interpretation:  Sinus tachycardia Consider right atrial enlargement  Right axis deviation Since last tracing rate faster Confirmed by Ferdie Bakken   MD-I, Trenell Concannon ([id number]) on 08/28/2013 9:44:37 AM      MDM   Final diagnoses:  Respiratory disease  CAP (community acquired pneumonia)  Asthma exacerbation   Plan admission   CRITICAL CARE Performed by: Rolland Porter L Total critical care time: 45 min Critical care time was exclusive of separately billable procedures and treating other patients. Critical care was necessary to treat or prevent imminent or life-threatening deterioration. Critical care was time spent personally by me on the following activities: development of treatment plan with patient and/or surrogate as well as nursing, discussions with consultants, evaluation of patient's response to treatment, examination of patient, obtaining history from patient or surrogate, ordering and performing treatments and interventions, ordering and review of laboratory studies, ordering and review of radiographic studies, pulse oximetry and re-evaluation of patient's condition.      Janice Norrie, MD 08/28/13 [phone]

## 2013-08-28 NOTE — Progress Notes (Signed)
Patient DX Celiac disease. MUST HAVE GLUTEN FREE DIET.

## 2013-08-29 LAB — CBC
HCT: 35.6 % — ABNORMAL LOW (ref 36.0–46.0)
Hemoglobin: 11.7 g/dL — ABNORMAL LOW (ref 12.0–15.0)
MCH: 27.7 pg (ref 26.0–34.0)
MCHC: 32.9 g/dL (ref 30.0–36.0)
MCV: 84.2 fL (ref 78.0–100.0)
PLATELETS: 275 10*3/uL (ref 150–400)
RBC: 4.23 MIL/uL (ref 3.87–5.11)
RDW: 14.2 % (ref 11.5–15.5)
WBC: 7 10*3/uL (ref 4.0–10.5)

## 2013-08-29 LAB — BASIC METABOLIC PANEL
BUN: 9 mg/dL (ref 6–23)
CALCIUM: 8.6 mg/dL (ref 8.4–10.5)
CO2: 26 mEq/L (ref 19–32)
Chloride: 99 mEq/L (ref 96–112)
Creatinine, Ser: 0.53 mg/dL (ref 0.50–1.10)
GFR calc non Af Amer: >90 mL/min (ref >90)
GLUCOSE: 136 mg/dL — AB (ref 70–99)
POTASSIUM: 4.3 meq/L (ref 3.7–5.3)
SODIUM: 137 meq/L (ref 137–147)

## 2013-08-29 LAB — INFLUENZA PANEL BY PCR (TYPE A & B)
H1N1 flu by pcr: NOT DETECTED
INFLAPCR: NEGATIVE
INFLBPCR: NEGATIVE

## 2013-08-29 LAB — LEGIONELLA ANTIGEN, URINE: Legionella Antigen, Urine: NEGATIVE

## 2013-08-29 MED ORDER — IPRATROPIUM-ALBUTEROL 0.5-2.5 (3) MG/3ML IN SOLN
3.0000 mL | Freq: Four times a day (QID) | RESPIRATORY_TRACT | Status: DC
  Administered 2013-08-29 – 2013-08-30 (×6): 3 mL via RESPIRATORY_TRACT
  Filled 2013-08-29 (×6): qty 3

## 2013-08-29 MED ORDER — IPRATROPIUM-ALBUTEROL 0.5-2.5 (3) MG/3ML IN SOLN: 3.0000 mL | RESPIRATORY_TRACT | Status: DC | PRN

## 2013-08-29 MED ORDER — OSELTAMIVIR PHOSPHATE 75 MG PO CAPS
75.0000 mg | ORAL_CAPSULE | Freq: Two times a day (BID) | ORAL | Status: DC
  Administered 2013-08-29 – 2013-08-30 (×4): 75 mg via ORAL
  Filled 2013-08-29 (×7): qty 1

## 2013-08-29 NOTE — Progress Notes (Signed)
BIPAP ordered PRN--Patient states feeling much improved, wob decreased, vitals stable--remains on 3 liters oxygen via nasal cannula. RT will continue to monitor and assess

## 2013-08-29 NOTE — Progress Notes (Signed)
[facility] PROGRESS NOTE  Jessica Gilbert HQI:[id number] DOB: [birth year] DOA: 08/28/2013 PCP: No primary provider on file.  Assessment/Plan: Acute Respiratory Failure -2/2 CAP. -Improving. -Did not require NIPPV overnight. -Will work on weaning oxygen today.  CAP -Continue rocephin/azithro. -Can transition to levaquin when ready for DC. -On empiric tamiflu pending results of RVP.  HTN -Well controlled.   Code Status: Full Code Family Communication: Patient only  Disposition Plan: Home when ready; likely 24-48 hours.   Consultants:  None   Antibiotics:  Rocephin  Azithro   Subjective: None  Objective: Filed Vitals:   08/29/13 0800 08/29/13 1000 08/29/13 1038 08/29/13 1358  BP: 103/69  110/62   Pulse: 100 98 90   Temp: 98.2 F (36.8 C)     TempSrc: Oral     Resp: 19 20 22    Height:      Weight:      SpO2: 92% 93% 98% 96%    Intake/Output Summary (Last 24 hours) at 08/29/13 1406 Last data filed at 08/29/13 1100  Gross per 24 hour  Intake   1950 ml  Output   1275 ml  Net    675 ml   Filed Weights   08/28/13 1330  Weight: 61.7 kg (136 lb 0.4 oz)    Exam:   General:  AA Ox3  Cardiovascular: RRR  Respiratory: CTA B  Abdomen: S/NT/ND/+BS  Extremities: no C/C/E   Neurologic:  Non-focal  Data Reviewed: Basic Metabolic Panel:  Recent Labs Lab 08/28/13 0937 08/29/13 0325  NA 133* 137  K 4.2 4.3  CL 93* 99  CO2 26 26  GLUCOSE 158* 136*  BUN 9 9  CREATININE 0.66 0.53  CALCIUM 9.4 8.6   Liver Function Tests:  Recent Labs Lab 08/28/13 0937  AST 41*  ALT 28  ALKPHOS 102  BILITOT 0.4  PROT 7.3  ALBUMIN 3.6   No results found for this basename: LIPASE, AMYLASE,  in the last 168 hours No results found for this basename: AMMONIA,  in the last 168 hours CBC:  Recent Labs Lab 08/28/13 0937 08/29/13 0325  WBC 12.8* 7.0  NEUTROABS 9.6*  --   HGB 14.0 11.7*  HCT 41.1 35.6*  MCV 84.0 84.2  PLT 301 275   Cardiac  Enzymes:  Recent Labs Lab 08/28/13 0937  TROPONINI <0.30   BNP (last 3 results)  Recent Labs  08/28/13 0937  PROBNP 118.5   CBG: No results found for this basename: GLUCAP,  in the last 168 hours  Recent Results (from the past 240 hour(s))  MRSA PCR SCREENING     Status: None   Collection Time    08/28/13  2:02 PM      Result Value Ref Range Status   MRSA by PCR NEGATIVE  NEGATIVE Final   Comment:            The GeneXpert MRSA Assay (FDA     approved for NASAL specimens     only), is one component of a     comprehensive MRSA colonization     surveillance program. It is not     intended to diagnose MRSA     infection nor to guide or     monitor treatment for     MRSA infections.  CULTURE, BLOOD (ROUTINE X 2)     Status: None   Collection Time    08/28/13  4:30 PM      Result Value Ref Range Status   Specimen Description BLOOD  RIGHT ARM   Final   Special Requests BOTTLES DRAWN AEROBIC ONLY 1CC   Final   Culture  Setup Time     Final   Value: 08/28/2013 22:33     Performed at [facility]   Culture     Final   Value:        BLOOD CULTURE RECEIVED NO GROWTH TO DATE CULTURE WILL BE HELD FOR 5 DAYS BEFORE ISSUING A FINAL NEGATIVE REPORT     Performed at [facility]   Report Status PENDING   Incomplete  CULTURE, BLOOD (ROUTINE X 2)     Status: None   Collection Time    08/28/13  4:45 PM      Result Value Ref Range Status   Specimen Description BLOOD RIGHT ARM   Final   Special Requests BOTTLES DRAWN AEROBIC ONLY 1CC   Final   Culture  Setup Time     Final   Value: 08/28/2013 22:34     Performed at [facility]   Culture     Final   Value:        BLOOD CULTURE RECEIVED NO GROWTH TO DATE CULTURE WILL BE HELD FOR 5 DAYS BEFORE ISSUING A FINAL NEGATIVE REPORT     Performed at [facility]   Report Status PENDING   Incomplete     Studies: Dg Chest Portable 1 View  08/28/2013   CLINICAL DATA:  Respiratory distress  EXAM: PORTABLE  CHEST - 1 VIEW  COMPARISON:  05/11/2013  FINDINGS: Cardiac shadow is stable. The lungs are well aerated bilaterally. Patchy infiltrative density is noted in the right lung base laterally. No other focal infiltrate is seen.  IMPRESSION: Right basilar infiltrate.   Electronically Signed   By: Inez Catalina M.D.   On: 08/28/2013 10:04    Scheduled Meds: . azithromycin  500 mg Intravenous Q24H  . cefTRIAXone (ROCEPHIN)  IV  1 g Intravenous Q24H  . enoxaparin (LOVENOX) injection  40 mg Subcutaneous Q24H  . ipratropium-albuterol  3 mL Nebulization Q6H  . irbesartan  75 mg Oral Daily  . multivitamin with minerals  1 tablet Oral Daily  . oseltamivir  75 mg Oral BID   Continuous Infusions: . sodium chloride 75 mL/hr at 08/29/13 0901    Principal Problem:   Acute respiratory failure Active Problems:   Respiratory distress   CAP (community acquired pneumonia)   HTN (hypertension)   Leukocytosis, unspecified    Time spent: 35 minutes. Greater than 50% of this time was spent in direct contact with the patient coordinating care.    Lelon Frohlich  [facility] Pager [phone]  If 7PM-7AM, please contact night-coverage at [email or web address], password [facility] 08/29/2013, 2:06 PM  LOS: 1 day

## 2013-08-29 NOTE — Plan of Care (Signed)
Problem: Phase I Progression Outcomes Goal: Dyspnea controlled at rest Outcome: Progressing Dyspnea w slightest exertion, recovers quickly.

## 2013-08-29 NOTE — Progress Notes (Signed)
CARE MANAGEMENT NOTE 08/29/2013  Patient:  Jessica Gilbert, Jessica Gilbert   Account Number:  [id number]  Date Initiated:  08/29/2013  Documentation initiated by:  Kullen Tomasetti  Subjective/Objective Assessment:   pt with resp distress and increased wob, hx of copd.     Action/Plan:   patient being transferred to med-surg floor on 03162015/lives at  home alone.   Anticipated DC Date:  08/29/2013   Anticipated DC Plan:  HOME/SELF CARE  In-house referral  NA      DC Planning Services  NA      [facility] Choice  NA   Choice offered to / List presented to:  NA   DME arranged  NA      DME agency  NA     [place] arranged  NA      [place] agency  NA   Status of service:  In process, will continue to follow Medicare Important Message given?  NA - LOS <3 / Initial given by admissions (If response is "NO", the following Medicare IM given date fields will be blank) Date Medicare IM given:   Date Additional Medicare IM given:    Discharge Disposition:    Per UR Regulation:  Reviewed for med. necessity/level of care/duration of stay  If discussed at [place] of Stay Meetings, dates discussed:    Comments:  03162015/Reford Olliff Eldridge Dace, [place], [place] [phone] Chart Reviewed for discharge and hospital needs. Discharge needs at time of review:  None present will follow for needs. Review of patient progress due on [id number].

## 2013-08-30 DIAGNOSIS — J989 Respiratory disorder, unspecified: Secondary | ICD-10-CM

## 2013-08-30 LAB — CBC
HCT: 35.8 % — ABNORMAL LOW (ref 36.0–46.0)
HEMOGLOBIN: 12.4 g/dL (ref 12.0–15.0)
MCH: 29.4 pg (ref 26.0–34.0)
MCHC: 34.6 g/dL (ref 30.0–36.0)
MCV: 84.8 fL (ref 78.0–100.0)
PLATELETS: 344 10*3/uL (ref 150–400)
RBC: 4.22 MIL/uL (ref 3.87–5.11)
RDW: 14.4 % (ref 11.5–15.5)
WBC: 12.6 10*3/uL — AB (ref 4.0–10.5)

## 2013-08-30 LAB — RESPIRATORY VIRUS PANEL
Adenovirus: NOT DETECTED
INFLUENZA A H3: NOT DETECTED
INFLUENZA B 1: NOT DETECTED
Influenza A H1: NOT DETECTED
Influenza A: NOT DETECTED
METAPNEUMOVIRUS: DETECTED — AB
PARAINFLUENZA 1 A: NOT DETECTED
PARAINFLUENZA 3 A: NOT DETECTED
Parainfluenza 2: NOT DETECTED
RESPIRATORY SYNCYTIAL VIRUS A: NOT DETECTED
RHINOVIRUS: NOT DETECTED
Respiratory Syncytial Virus B: NOT DETECTED

## 2013-08-30 LAB — BASIC METABOLIC PANEL
BUN: 12 mg/dL (ref 6–23)
CALCIUM: 8.5 mg/dL (ref 8.4–10.5)
CO2: 28 mEq/L (ref 19–32)
Chloride: 101 mEq/L (ref 96–112)
Creatinine, Ser: 0.64 mg/dL (ref 0.50–1.10)
Glucose, Bld: 101 mg/dL — ABNORMAL HIGH (ref 70–99)
POTASSIUM: 4.1 meq/L (ref 3.7–5.3)
SODIUM: 141 meq/L (ref 137–147)

## 2013-08-30 MED ORDER — ACETYLCYSTEINE 20 % IN SOLN
4.0000 mL | Freq: Two times a day (BID) | RESPIRATORY_TRACT | Status: DC
  Administered 2013-08-30 – 2013-09-03 (×9): 4 mL via RESPIRATORY_TRACT
  Filled 2013-08-30 (×9): qty 4

## 2013-08-30 MED ORDER — ACETYLCYSTEINE 20 % IN SOLN
2.0000 mL | Freq: Two times a day (BID) | RESPIRATORY_TRACT | Status: DC
  Filled 2013-08-30 (×3): qty 4

## 2013-08-30 MED ORDER — GUAIFENESIN ER 600 MG PO TB12
600.0000 mg | ORAL_TABLET | Freq: Two times a day (BID) | ORAL | Status: DC
  Administered 2013-08-30 – 2013-09-02 (×6): 600 mg via ORAL
  Filled 2013-08-30 (×13): qty 1

## 2013-08-30 MED ORDER — ALBUTEROL SULFATE (2.5 MG/3ML) 0.083% IN NEBU
2.5000 mg | INHALATION_SOLUTION | RESPIRATORY_TRACT | Status: DC | PRN
  Administered 2013-08-30 – 2013-09-04 (×3): 2.5 mg via RESPIRATORY_TRACT
  Filled 2013-08-30 (×4): qty 3

## 2013-08-30 MED ORDER — LORAZEPAM 1 MG PO TABS
1.0000 mg | ORAL_TABLET | Freq: Once | ORAL | Status: AC
  Administered 2013-08-30: 0.5 mg via ORAL
  Filled 2013-08-30: qty 1

## 2013-08-30 MED ORDER — HYDROCOD POLST-CHLORPHEN POLST 10-8 MG/5ML PO LQCR
5.0000 mL | Freq: Once | ORAL | Status: AC
  Administered 2013-08-30: 5 mL via ORAL
  Filled 2013-08-30: qty 5

## 2013-08-30 NOTE — Plan of Care (Signed)
Problem: Phase I Progression Outcomes Goal: Dyspnea controlled at rest Outcome: Progressing Patient remains SOB with exertion to [facility] and back to bed. Still with NP cough. RVP still pending.

## 2013-08-30 NOTE — Progress Notes (Signed)
[facility] PROGRESS NOTE  SAN RUA MHD:[id number] DOB: [birth year] DOA: 08/28/2013 PCP: No primary provider on file.  Assessment/Plan: Acute Respiratory Failure -2/2 CAP. -Had some increased respiratory distress today, likely related to mucous plugging; much improved after albuterol/mucomyst nebs and mucinex.  CAP -Continue rocephin/azithro. -Can transition to levaquin when ready for DC. -On empiric tamiflu pending results of RVP.  HTN -Well controlled.   Code Status: Full Code Family Communication: Patient only  Disposition Plan: Home when ready; likely 24-48 hours.   Consultants:  None   Antibiotics:  Rocephin  Azithro   Subjective: Had some increased respiratory distress earlier today, but is comfortable at present.  Objective: Filed Vitals:   08/30/13 0706 08/30/13 0828 08/30/13 1310 08/30/13 1416  BP: 136/84  167/98 138/88  Pulse: 94  103   Temp: 98.3 F (36.8 C)  98.1 F (36.7 C)   TempSrc: Oral  Oral   Resp: 16  20   Height:      Weight:      SpO2: 93% 92% 100%     Intake/Output Summary (Last 24 hours) at 08/30/13 1605 Last data filed at 08/30/13 1539  Gross per 24 hour  Intake 4107.5 ml  Output   2400 ml  Net 1707.5 ml   Filed Weights   08/28/13 1330 08/29/13 1141  Weight: 61.7 kg (136 lb 0.4 oz) 62.596 kg (138 lb)    Exam:   General:  AA Ox3  Cardiovascular: RRR  Respiratory: Bilateral ronchi, no wheezing  Abdomen: S/NT/ND/+BS  Extremities: no C/C/E   Neurologic:  Non-focal  Data Reviewed: Basic Metabolic Panel:  Recent Labs Lab 08/28/13 0937 08/29/13 0325 08/30/13 0345  NA 133* 137 141  K 4.2 4.3 4.1  CL 93* 99 101  CO2 26 26 28   GLUCOSE 158* 136* 101*  BUN 9 9 12   CREATININE 0.66 0.53 0.64  CALCIUM 9.4 8.6 8.5   Liver Function Tests:  Recent Labs Lab 08/28/13 0937  AST 41*  ALT 28  ALKPHOS 102  BILITOT 0.4  PROT 7.3  ALBUMIN 3.6   No results found for this basename: LIPASE, AMYLASE,  in  the last 168 hours No results found for this basename: AMMONIA,  in the last 168 hours CBC:  Recent Labs Lab 08/28/13 0937 08/29/13 0325 08/30/13 0345  WBC 12.8* 7.0 12.6*  NEUTROABS 9.6*  --   --   HGB 14.0 11.7* 12.4  HCT 41.1 35.6* 35.8*  MCV 84.0 84.2 84.8  PLT 301 275 344   Cardiac Enzymes:  Recent Labs Lab 08/28/13 0937  TROPONINI <0.30   BNP (last 3 results)  Recent Labs  08/28/13 0937  PROBNP 118.5   CBG: No results found for this basename: GLUCAP,  in the last 168 hours  Recent Results (from the past 240 hour(s))  MRSA PCR SCREENING     Status: None   Collection Time    08/28/13  2:02 PM      Result Value Ref Range Status   MRSA by PCR NEGATIVE  NEGATIVE Final   Comment:            The GeneXpert MRSA Assay (FDA     approved for NASAL specimens     only), is one component of a     comprehensive MRSA colonization     surveillance program. It is not     intended to diagnose MRSA     infection nor to guide or     monitor treatment for  MRSA infections.  CULTURE, BLOOD (ROUTINE X 2)     Status: None   Collection Time    08/28/13  4:30 PM      Result Value Ref Range Status   Specimen Description BLOOD RIGHT ARM   Final   Special Requests BOTTLES DRAWN AEROBIC ONLY 1CC   Final   Culture  Setup Time     Final   Value: 08/28/2013 22:33     Performed at [facility]   Culture     Final   Value:        BLOOD CULTURE RECEIVED NO GROWTH TO DATE CULTURE WILL BE HELD FOR 5 DAYS BEFORE ISSUING A FINAL NEGATIVE REPORT     Performed at [facility]   Report Status PENDING   Incomplete  CULTURE, BLOOD (ROUTINE X 2)     Status: None   Collection Time    08/28/13  4:45 PM      Result Value Ref Range Status   Specimen Description BLOOD RIGHT ARM   Final   Special Requests BOTTLES DRAWN AEROBIC ONLY 1CC   Final   Culture  Setup Time     Final   Value: 08/28/2013 22:34     Performed at [facility]   Culture     Final   Value:         BLOOD CULTURE RECEIVED NO GROWTH TO DATE CULTURE WILL BE HELD FOR 5 DAYS BEFORE ISSUING A FINAL NEGATIVE REPORT     Performed at [facility]   Report Status PENDING   Incomplete     Studies: No results found.  Scheduled Meds: . acetylcysteine  4 mL Nebulization BID  . azithromycin  500 mg Intravenous Q24H  . cefTRIAXone (ROCEPHIN)  IV  1 g Intravenous Q24H  . enoxaparin (LOVENOX) injection  40 mg Subcutaneous Q24H  . guaiFENesin  600 mg Oral BID  . ipratropium-albuterol  3 mL Nebulization Q6H  . irbesartan  75 mg Oral Daily  . multivitamin with minerals  1 tablet Oral Daily  . oseltamivir  75 mg Oral BID   Continuous Infusions: . sodium chloride 1,000 mL (08/29/13 2305)    Principal Problem:   Acute respiratory failure Active Problems:   Respiratory distress   CAP (community acquired pneumonia)   HTN (hypertension)   Leukocytosis, unspecified    Time spent: 35 minutes. Greater than 50% of this time was spent in direct contact with the patient coordinating care.    Lelon Frohlich  [facility] Pager [phone]  If 7PM-7AM, please contact night-coverage at [email or web address], password [facility] 08/30/2013, 4:05 PM  LOS: 2 days

## 2013-08-30 NOTE — Progress Notes (Signed)
Patient with NP congested cough that "is keeping me awake" per patient. Paged NP on call. Order for tussinex x1 ordered.

## 2013-08-31 ENCOUNTER — Inpatient Hospital Stay (HOSPITAL_COMMUNITY): Payer: Managed Care, Other (non HMO)

## 2013-08-31 DIAGNOSIS — D72829 Elevated white blood cell count, unspecified: Secondary | ICD-10-CM

## 2013-08-31 LAB — CBC
HEMATOCRIT: 36.2 % (ref 36.0–46.0)
HEMOGLOBIN: 11.7 g/dL — AB (ref 12.0–15.0)
MCH: 27.8 pg (ref 26.0–34.0)
MCHC: 32.3 g/dL (ref 30.0–36.0)
MCV: 86 fL (ref 78.0–100.0)
Platelets: 326 10*3/uL (ref 150–400)
RBC: 4.21 MIL/uL (ref 3.87–5.11)
RDW: 14.3 % (ref 11.5–15.5)
WBC: 10.6 10*3/uL — AB (ref 4.0–10.5)

## 2013-08-31 MED ORDER — IPRATROPIUM-ALBUTEROL 0.5-2.5 (3) MG/3ML IN SOLN
3.0000 mL | Freq: Four times a day (QID) | RESPIRATORY_TRACT | Status: DC
  Administered 2013-08-31 – 2013-09-04 (×17): 3 mL via RESPIRATORY_TRACT
  Filled 2013-08-31 (×16): qty 3

## 2013-08-31 MED ORDER — LEVOFLOXACIN 750 MG PO TABS
750.0000 mg | ORAL_TABLET | Freq: Every day | ORAL | Status: DC
  Administered 2013-08-31 – 2013-09-04 (×5): 750 mg via ORAL
  Filled 2013-08-31 (×5): qty 1

## 2013-08-31 MED ORDER — GUAIFENESIN-DM 100-10 MG/5ML PO SYRP
5.0000 mL | ORAL_SOLUTION | ORAL | Status: DC | PRN
  Administered 2013-08-31 – 2013-09-03 (×6): 5 mL via ORAL
  Filled 2013-08-31 (×6): qty 10

## 2013-08-31 MED ORDER — LORAZEPAM 0.5 MG PO TABS
0.5000 mg | ORAL_TABLET | Freq: Once | ORAL | Status: AC
  Administered 2013-08-31: 0.5 mg via ORAL
  Filled 2013-08-31: qty 1

## 2013-08-31 NOTE — Progress Notes (Addendum)
[facility] PROGRESS NOTE  Jessica Gilbert HCW:[id number] DOB: 1945-08-25 DOA: 08/28/2013 PCP: No primary provider on file.  Assessment/Plan:  68 y/o female with PMH of [place] s/p R upper lobectomy, s/c chemo/rad presented with SOB is admitted with Pneumonia   1. Acute Respiratory Failure  -2/2 CAP.  -cont atx, added bronchodilators, cotn oxygen  2. CAP; CXR: Right basilar infiltrate -Continue rocephin/azithro. influenza neg;+Metapneumovirus; cont supportive care  -still mild res distress; repeat CXR 3. HTN  -Well controlled.  Code Status: full Family Communication: d/w patient, called Jessica Gilbert, Jessica Gilbert Son [phone] no answer; try later  (indicate person spoken with, relationship, and if by phone, the number) Disposition Plan: home 2-3 days    Consultants:  None   Procedures:  None   Antibiotics: Rocephin  Azithro    (indicate start date, and stop date if known)  HPI/Subjective: alert  Objective: Filed Vitals:   08/31/13 0619  BP: 153/70  Pulse: 88  Temp: 98.3 F (36.8 C)  Resp: 20    Intake/Output Summary (Last 24 hours) at 08/31/13 0929 Last data filed at 08/31/13 0615  Gross per 24 hour  Intake 2987.5 ml  Output   3350 ml  Net -362.5 ml   Filed Weights   08/28/13 1330 08/29/13 1141  Weight: 61.7 kg (136 lb 0.4 oz) 62.596 kg (138 lb)    Exam:   General:  alert  Cardiovascular: s1,s2 rrr  Respiratory: few rales   Abdomen: soft, nt,nd   Musculoskeletal: no LE edema   Data Reviewed: Basic Metabolic Panel:  Recent Labs Lab 08/28/13 0937 08/29/13 0325 08/30/13 0345  NA 133* 137 141  K 4.2 4.3 4.1  CL 93* 99 101  CO2 26 26 28   GLUCOSE 158* 136* 101*  BUN 9 9 12   CREATININE 0.66 0.53 0.64  CALCIUM 9.4 8.6 8.5   Liver Function Tests:  Recent Labs Lab 08/28/13 0937  AST 41*  ALT 28  ALKPHOS 102  BILITOT 0.4  PROT 7.3  ALBUMIN 3.6   No results found for this basename: LIPASE, AMYLASE,  in the last 168 hours No  results found for this basename: AMMONIA,  in the last 168 hours CBC:  Recent Labs Lab 08/28/13 0937 08/29/13 0325 08/30/13 0345 08/31/13 0342  WBC 12.8* 7.0 12.6* 10.6*  NEUTROABS 9.6*  --   --   --   HGB 14.0 11.7* 12.4 11.7*  HCT 41.1 35.6* 35.8* 36.2  MCV 84.0 84.2 84.8 86.0  PLT 301 275 344 326   Cardiac Enzymes:  Recent Labs Lab 08/28/13 0937  TROPONINI <0.30   BNP (last 3 results)  Recent Labs  08/28/13 0937  PROBNP 118.5   CBG: No results found for this basename: GLUCAP,  in the last 168 hours  Recent Results (from the past 240 hour(s))  RESPIRATORY VIRUS PANEL     Status: Abnormal   Collection Time    08/28/13 12:26 PM      Result Value Ref Range Status   Source - RVPAN NOSE   Final   Respiratory Syncytial Virus A NOT DETECTED   Final   Respiratory Syncytial Virus B NOT DETECTED   Final   Influenza A NOT DETECTED   Final   Influenza B NOT DETECTED   Final   Parainfluenza 1 NOT DETECTED   Final   Parainfluenza 2 NOT DETECTED   Final   Parainfluenza 3 NOT DETECTED   Final   Metapneumovirus DETECTED (*)  Final   Rhinovirus NOT DETECTED  Final   Adenovirus NOT DETECTED   Final   Influenza A H1 NOT DETECTED   Final   Influenza A H3 NOT DETECTED   Final   Comment: (NOTE)           Normal Reference Range for each Analyte: NOT DETECTED     Testing performed using the Luminex xTAG Respiratory Viral Panel test     kit.     This test was developed and its performance characteristics determined     by [facility]. It has not been cleared or approved by the [place]     Food and Drug Administration. This test is used for clinical purposes.     It should not be regarded as investigational or for research. This     laboratory is certified under the [place] (CLIA) as qualified to perform high complexity     clinical laboratory testing.     Performed at [facility] PCR SCREENING     Status: None    Collection Time    08/28/13  2:02 PM      Result Value Ref Range Status   MRSA by PCR NEGATIVE  NEGATIVE Final   Comment:            The GeneXpert MRSA Assay (FDA     approved for NASAL specimens     only), is one component of a     comprehensive MRSA colonization     surveillance program. It is not     intended to diagnose MRSA     infection nor to guide or     monitor treatment for     MRSA infections.  CULTURE, BLOOD (ROUTINE X 2)     Status: None   Collection Time    08/28/13  4:30 PM      Result Value Ref Range Status   Specimen Description BLOOD RIGHT ARM   Final   Special Requests BOTTLES DRAWN AEROBIC ONLY 1CC   Final   Culture  Setup Time     Final   Value: 08/28/2013 22:33     Performed at [facility]   Culture     Final   Value:        BLOOD CULTURE RECEIVED NO GROWTH TO DATE CULTURE WILL BE HELD FOR 5 DAYS BEFORE ISSUING A FINAL NEGATIVE REPORT     Performed at [facility]   Report Status PENDING   Incomplete  CULTURE, BLOOD (ROUTINE X 2)     Status: None   Collection Time    08/28/13  4:45 PM      Result Value Ref Range Status   Specimen Description BLOOD RIGHT ARM   Final   Special Requests BOTTLES DRAWN AEROBIC ONLY 1CC   Final   Culture  Setup Time     Final   Value: 08/28/2013 22:34     Performed at [facility]   Culture     Final   Value:        BLOOD CULTURE RECEIVED NO GROWTH TO DATE CULTURE WILL BE HELD FOR 5 DAYS BEFORE ISSUING A FINAL NEGATIVE REPORT     Performed at [facility]   Report Status PENDING   Incomplete     Studies: No results found.  Scheduled Meds: . acetylcysteine  4 mL Nebulization BID  . azithromycin  500 mg Intravenous Q24H  . cefTRIAXone (ROCEPHIN)  IV  1 g Intravenous Q24H  . enoxaparin (LOVENOX) injection  40 mg Subcutaneous Q24H  . guaiFENesin  600 mg Oral BID  . ipratropium-albuterol  3 mL Nebulization QID  . irbesartan  75 mg Oral Daily  . multivitamin with minerals  1 tablet  Oral Daily  . oseltamivir  75 mg Oral BID   Continuous Infusions: . sodium chloride 1,000 mL (08/30/13 2249)    Principal Problem:   Acute respiratory failure Active Problems:   Respiratory distress   CAP (community acquired pneumonia)   HTN (hypertension)   Leukocytosis, unspecified    Time spent: >35 minutes     Kinnie Feil  [facility] Pager [phone]. If 7PM-7AM, please contact night-coverage at [email or web address], password [facility] 08/31/2013, 9:29 AM  LOS: 3 days

## 2013-08-31 NOTE — Progress Notes (Signed)
Patient requesting something to "help" her rest. NP called and order for ativan obtained x1 dose.

## 2013-08-31 NOTE — Evaluation (Signed)
Clinical/Bedside Swallow Evaluation Patient Details  Name: Jessica Gilbert MRN: [id number] Date of Birth: 1946/03/14  Today's Date: 08/31/2013 Time: 6222-9798 SLP Time Calculation (min): 46 min  Past Medical History:  Past Medical History  Diagnosis Date  . Cancer     lung  . Acute respiratory failure   . Celiac disease    Past Surgical History:  Past Surgical History  Procedure Laterality Date  . Lung removal, partial Right upper right   HPI:  68 yo female admitted to [facility] with Acute respiratory failure diagnosed with CAP.  PMH + for COPD, lung cancer s/p surgery, radiation/chemo treatment.  Pt reports coughing with solid foods - denies dysphagia to liquids.  She pointed to midsternum to indicate are of difficulties clearing solids - but states this only occurs when she is acutely sick.  Esophagitis during radiation tx years ago was significant but naturally resolved after treatment.  Pt with right lower lobe infiltrate.     Assessment / Plan / Recommendation Clinical Impression  Pt with functional oropharyngeal swallow ability based on clinical assessment, she reports h/o TIA resulting in minimally transient decreased lower left facial movement.  Xerostomia reported by pt which may contribute to symptoms.    Observed pt consuming water and icecream without s/s of aspiration.  SLP could not observe pt with solids as did not have nongluten product.  Pt reports dysphagia to solid foods only when acutely ill - pointing to mid-esophagus to indicate area of stasis.    SLP reviewed in detail compensation strategies for eating with maximal airway protection given her COPD, xerostomia, intermittent esophageal symptoms.  Also discussed need to monitor her esophageal function and seek work-up if worsens/does not resolve when well due to possible delayed radiation effects.      Aspiration Risk  Mild    Diet Recommendation Regular;Thin liquid   Liquid Administration via: Cup;Straw Medication  Administration: Whole meds with liquid Supervision: Patient able to self feed Compensations: Slow rate;Small sips/bites (start with liquids, consume liquids t/o meal, rest break if short of breath) Postural Changes and/or Swallow Maneuvers: Upright 30-60 min after meal;Seated upright 90 degrees    Other  Recommendations Oral Care Recommendations: Oral care BID   Follow Up Recommendations  None    Frequency and Duration        Pertinent Vitals/Pain Afebrile, decreased     Swallow Study    General Date of Onset: 08/31/13 HPI: 68 yo female admitted to [facility] with Acute respiratory failure diagnosed with CAP.  PMH + for COPD, lung cancer s/p surgery, radiation/chemo treatment.  Pt reports coughing with solid foods - denies dysphagia to liquids.  She pointed to midsternum to indicate are of difficulties clearing solids - but states this only occurs when she is acutely sick.  Esophagitis during radiation tx years ago was significant but naturally resolved after treatment.  Pt with right lower lobe infiltrate.   Type of Study: Bedside swallow evaluation Diet Prior to this Study: Regular;Thin liquids (heart healthy, gluten free) Temperature Spikes Noted: No Respiratory Status: Nasal cannula History of Recent Intubation: No Behavior/Cognition: Alert;Pleasant mood;Cooperative Oral Cavity - Dentition: Adequate natural dentition Self-Feeding Abilities: Able to feed self Patient Positioning: Upright in bed Baseline Vocal Quality: Clear Volitional Cough: Strong Volitional Swallow: Able to elicit    Oral/Motor/Sensory Function Overall Oral Motor/Sensory Function: Appears within functional limits for tasks assessed (pt has h/o minimal left facial weakness from previous TIA per pt)   Ice Chips Ice chips: Not tested  Thin Liquid Thin Liquid: Within functional limits Presentation: Self Fed;Straw    Nectar Thick Nectar Thick Liquid: Not tested   Honey Thick Honey Thick Liquid: Not tested   Puree  Puree: Within functional limits Presentation: Self Fed;Spoon Other Comments: icecream-chocolate   Solid   GO    Solid: Not tested Other Comments: did not test due to pt's food limitations - unable to consume crackers from floor due to celiac dx       Claudie Fisherman, [facility] SLP [phone]

## 2013-08-31 NOTE — Plan of Care (Signed)
Problem: Phase I Progression Outcomes Goal: Dyspnea controlled at rest Outcome: Progressing Patient remains SOB with exertion. Using [facility] to conserve.

## 2013-08-31 NOTE — Progress Notes (Signed)
08/31/13 1725 Patient refused lovenox injection. Pt educated on the purpose of lovenox as a preventive measure to reduce her chances of a blood clot.

## 2013-09-01 DIAGNOSIS — J45901 Unspecified asthma with (acute) exacerbation: Secondary | ICD-10-CM

## 2013-09-01 MED ORDER — METHYLPREDNISOLONE SODIUM SUCC 125 MG IJ SOLR
60.0000 mg | Freq: Four times a day (QID) | INTRAMUSCULAR | Status: DC
  Administered 2013-09-01 – 2013-09-02 (×4): 60 mg via INTRAVENOUS
  Filled 2013-09-01 (×8): qty 0.96

## 2013-09-01 MED ORDER — LORAZEPAM 0.5 MG PO TABS
0.5000 mg | ORAL_TABLET | Freq: Once | ORAL | Status: AC
  Administered 2013-09-01: 0.5 mg via ORAL
  Filled 2013-09-01: qty 1

## 2013-09-01 NOTE — Progress Notes (Signed)
[facility] PROGRESS NOTE  Jessica Gilbert LEX:[id number] DOB: Nov 01, 1945 DOA: 08/28/2013 PCP: No primary provider on file.  Assessment/Plan:  68 y/o female with PMH of [place] s/p R upper lobectomy, s/c chemo/rad presented with SOB is admitted with Pneumonia   1. Acute Respiratory Failure likely due to COPD exacerbation with probable pneumonia   -repeat CXR: improvement in infiltrates; but COPD changes; patient does have h/o tobacco use, COPD but not taking inhalers at home -add IV steroids (3/19); cont atx, added bronchodilators, cont oxygen  2. CAP; CXR: Right basilar infiltrate -improvement in xray;  cont atx; influenza neg;+Metapneumovirus; cont supportive care  3. HTN well controlled  Code Status: full Family Communication: d/w patient, called updated Grulke,Michael Son [phone]; try later  (indicate person spoken with, relationship, and if by phone, the number) Disposition Plan: home 2-3 days    Consultants:  None   Procedures:  None   Antibiotics: Rocephin  Azithro    (indicate start date, and stop date if known)  HPI/Subjective: alert  Objective: Filed Vitals:   09/01/13 0454  BP: 143/87  Pulse: 87  Temp: 99.1 F (37.3 C)  Resp: 19    Intake/Output Summary (Last 24 hours) at 09/01/13 0831 Last data filed at 09/01/13 0455  Gross per 24 hour  Intake 2201.25 ml  Output   3250 ml  Net -1048.75 ml   Filed Weights   08/28/13 1330 08/29/13 1141  Weight: 61.7 kg (136 lb 0.4 oz) 62.596 kg (138 lb)    Exam:   General:  alert  Cardiovascular: s1,s2 rrr  Respiratory: few rales   Abdomen: soft, nt,nd   Musculoskeletal: no LE edema   Data Reviewed: Basic Metabolic Panel:  Recent Labs Lab 08/28/13 0937 08/29/13 0325 08/30/13 0345  NA 133* 137 141  K 4.2 4.3 4.1  CL 93* 99 101  CO2 26 26 28   GLUCOSE 158* 136* 101*  BUN 9 9 12   CREATININE 0.66 0.53 0.64  CALCIUM 9.4 8.6 8.5   Liver Function Tests:  Recent Labs Lab  08/28/13 0937  AST 41*  ALT 28  ALKPHOS 102  BILITOT 0.4  PROT 7.3  ALBUMIN 3.6   No results found for this basename: LIPASE, AMYLASE,  in the last 168 hours No results found for this basename: AMMONIA,  in the last 168 hours CBC:  Recent Labs Lab 08/28/13 0937 08/29/13 0325 08/30/13 0345 08/31/13 0342  WBC 12.8* 7.0 12.6* 10.6*  NEUTROABS 9.6*  --   --   --   HGB 14.0 11.7* 12.4 11.7*  HCT 41.1 35.6* 35.8* 36.2  MCV 84.0 84.2 84.8 86.0  PLT 301 275 344 326   Cardiac Enzymes:  Recent Labs Lab 08/28/13 0937  TROPONINI <0.30   BNP (last 3 results)  Recent Labs  08/28/13 0937  PROBNP 118.5   CBG: No results found for this basename: GLUCAP,  in the last 168 hours  Recent Results (from the past 240 hour(s))  RESPIRATORY VIRUS PANEL     Status: Abnormal   Collection Time    08/28/13 12:26 PM      Result Value Ref Range Status   Source - RVPAN NOSE   Final   Respiratory Syncytial Virus A NOT DETECTED   Final   Respiratory Syncytial Virus B NOT DETECTED   Final   Influenza A NOT DETECTED   Final   Influenza B NOT DETECTED   Final   Parainfluenza 1 NOT DETECTED   Final   Parainfluenza 2  NOT DETECTED   Final   Parainfluenza 3 NOT DETECTED   Final   Metapneumovirus DETECTED (*)  Final   Rhinovirus NOT DETECTED   Final   Adenovirus NOT DETECTED   Final   Influenza A H1 NOT DETECTED   Final   Influenza A H3 NOT DETECTED   Final   Comment: (NOTE)           Normal Reference Range for each Analyte: NOT DETECTED     Testing performed using the Luminex xTAG Respiratory Viral Panel test     kit.     This test was developed and its performance characteristics determined     by [employer]. It has not been cleared or approved by the [place]     Food and Drug Administration. This test is used for clinical purposes.     It should not be regarded as investigational or for research. This     laboratory is certified under the [place] (CLIA) as qualified to perform high complexity     clinical laboratory testing.     Performed at [place] PCR SCREENING     Status: None   Collection Time    08/28/13  2:02 PM      Result Value Ref Range Status   MRSA by PCR NEGATIVE  NEGATIVE Final   Comment:            The GeneXpert MRSA Assay (FDA     approved for NASAL specimens     only), is one component of a     comprehensive MRSA colonization     surveillance program. It is not     intended to diagnose MRSA     infection nor to guide or     monitor treatment for     MRSA infections.  CULTURE, BLOOD (ROUTINE X 2)     Status: None   Collection Time    08/28/13  4:30 PM      Result Value Ref Range Status   Specimen Description BLOOD RIGHT ARM   Final   Special Requests BOTTLES DRAWN AEROBIC ONLY 1CC   Final   Culture  Setup Time     Final   Value: 08/28/2013 22:33     Performed at [facility]   Culture     Final   Value:        BLOOD CULTURE RECEIVED NO GROWTH TO DATE CULTURE WILL BE HELD FOR 5 DAYS BEFORE ISSUING A FINAL NEGATIVE REPORT     Performed at [facility]   Report Status PENDING   Incomplete  CULTURE, BLOOD (ROUTINE X 2)     Status: None   Collection Time    08/28/13  4:45 PM      Result Value Ref Range Status   Specimen Description BLOOD RIGHT ARM   Final   Special Requests BOTTLES DRAWN AEROBIC ONLY 1CC   Final   Culture  Setup Time     Final   Value: 08/28/2013 22:34     Performed at [facility]   Culture     Final   Value:        BLOOD CULTURE RECEIVED NO GROWTH TO DATE CULTURE WILL BE HELD FOR 5 DAYS BEFORE ISSUING A FINAL NEGATIVE REPORT     Performed at [facility]   Report Status PENDING   Incomplete     Studies:  Dg Chest Port 1 View  08/31/2013   CLINICAL DATA:  Pneumonia, shortness of breath.  EXAM: PORTABLE CHEST - 1 VIEW  COMPARISON:  08/28/2013  FINDINGS: There is hyperinflation of the lungs compatible with COPD. Heart  is upper limits normal in size. No confluent airspace opacities currently noted. No effusions. No acute bony abnormality.  IMPRESSION: Improvement in the right basilar opacity. No current confluent airspace opacity.  COPD.   Electronically Signed   By: Rolm Baptise M.D.   On: 08/31/2013 16:29    Scheduled Meds: . acetylcysteine  4 mL Nebulization BID  . enoxaparin (LOVENOX) injection  40 mg Subcutaneous Q24H  . guaiFENesin  600 mg Oral BID  . ipratropium-albuterol  3 mL Nebulization QID  . irbesartan  75 mg Oral Daily  . levofloxacin  750 mg Oral Daily  . multivitamin with minerals  1 tablet Oral Daily   Continuous Infusions:    Principal Problem:   Acute respiratory failure Active Problems:   Respiratory distress   CAP (community acquired pneumonia)   HTN (hypertension)   Leukocytosis, unspecified    Time spent: >35 minutes     Kinnie Feil  [facility] Pager [phone]. If 7PM-7AM, please contact night-coverage at [email or web address], password [facility] 09/01/2013, 8:31 AM  LOS: 4 days

## 2013-09-01 NOTE — Progress Notes (Signed)
Patient continues to be SOB with minimal exertion. Had placed patient on continuous pulse ox earlier and tried to wean oxygen. When patient awoke and tried to go to bathroom became SOB with labored breathing/tachypneic and desated to low 80's on 1l [place]. Oxygen increased back up to 3l [place] tonight with sats improving to low 90's within a few minutes. Spoke to NP on call about the above. ? If steroids could be helpful. Patient with audible wheezing but lungs sound clear. She to pass on to rounding MD the above. No new orders at this time.

## 2013-09-01 NOTE — Progress Notes (Signed)
Patient requesting robitussin DM to see if can "break loose some of this congestion."  Paged NP order received.

## 2013-09-01 NOTE — Progress Notes (Signed)
Patient requesting ativan for sleep again tonight. Paged NP and order obtained.

## 2013-09-01 NOTE — Plan of Care (Signed)
Problem: Phase I Progression Outcomes Goal: O2 sats > or equal 90% or at baseline Outcome: Not Met (add Reason) desats with getting oob

## 2013-09-01 NOTE — Plan of Care (Signed)
Problem: Phase II Progression Outcomes Goal: Activity at appropriate level-compared to baseline (UP IN CHAIR FOR HEMODIALYSIS)  Outcome: Not Met (add Reason) Due to SOB with exertion and decrease endurance

## 2013-09-02 DIAGNOSIS — R0989 Other specified symptoms and signs involving the circulatory and respiratory systems: Secondary | ICD-10-CM

## 2013-09-02 DIAGNOSIS — R0609 Other forms of dyspnea: Secondary | ICD-10-CM

## 2013-09-02 MED ORDER — MOMETASONE FURO-FORMOTEROL FUM 200-5 MCG/ACT IN AERO
2.0000 | INHALATION_SPRAY | Freq: Two times a day (BID) | RESPIRATORY_TRACT | Status: DC
  Administered 2013-09-02 – 2013-09-04 (×5): 2 via RESPIRATORY_TRACT
  Filled 2013-09-02: qty 8.8

## 2013-09-02 MED ORDER — METHYLPREDNISOLONE SODIUM SUCC 125 MG IJ SOLR
60.0000 mg | Freq: Two times a day (BID) | INTRAMUSCULAR | Status: DC
  Administered 2013-09-02 – 2013-09-04 (×4): 60 mg via INTRAVENOUS
  Filled 2013-09-02 (×6): qty 0.96

## 2013-09-02 NOTE — Progress Notes (Signed)
[facility] PROGRESS NOTE  Jessica Gilbert YTW:[id number] DOB: April 22, 1946 DOA: 08/28/2013 PCP: No primary provider on file.  Assessment/Plan:  68 y/o female with PMH of [place] s/p R upper lobectomy, s/c chemo/rad presented with SOB is admitted with Pneumonia   1. Acute Respiratory Failure likely due to COPD exacerbation with probable pneumonia   -repeat CXR: improvement in infiltrates; but COPD changes; patient does have h/o tobacco use, COPD but not taking inhalers at home (patient stopped taking advair, spiriva due to ?intolerance) -added IV steroids (3/19); cont atx, added bronchodilators, dulera; cont oxygen  2. CAP; CXR: Right basilar infiltrate -improvement in xray;  cont atx; influenza neg;+Metapneumovirus; cont supportive care  3. HTN well controlled  Code Status: full Family Communication: d/w patient, called updated [place],Michael Son [phone]; try later  (indicate person spoken with, relationship, and if by phone, the number) Disposition Plan: home 2-3 days    Consultants:  None   Procedures:  None   Antibiotics: Rocephin  Azithro    (indicate start date, and stop date if known)  HPI/Subjective: alert  Objective: Filed Vitals:   09/02/13 0553  BP: 131/68  Pulse: 93  Temp: 98.4 F (36.9 C)  Resp: 22    Intake/Output Summary (Last 24 hours) at 09/02/13 0944 Last data filed at 09/02/13 0839  Gross per 24 hour  Intake    720 ml  Output   3300 ml  Net  -2580 ml   Filed Weights   08/28/13 1330 08/29/13 1141  Weight: 61.7 kg (136 lb 0.4 oz) 62.596 kg (138 lb)    Exam:   General:  alert  Cardiovascular: s1,s2 rrr  Respiratory: few rales   Abdomen: soft, nt,nd   Musculoskeletal: no LE edema   Data Reviewed: Basic Metabolic Panel:  Recent Labs Lab 08/28/13 0937 08/29/13 0325 08/30/13 0345  NA 133* 137 141  K 4.2 4.3 4.1  CL 93* 99 101  CO2 26 26 28   GLUCOSE 158* 136* 101*  BUN 9 9 12   CREATININE 0.66 0.53 0.64  CALCIUM  9.4 8.6 8.5   Liver Function Tests:  Recent Labs Lab 08/28/13 0937  AST 41*  ALT 28  ALKPHOS 102  BILITOT 0.4  PROT 7.3  ALBUMIN 3.6   No results found for this basename: LIPASE, AMYLASE,  in the last 168 hours No results found for this basename: AMMONIA,  in the last 168 hours CBC:  Recent Labs Lab 08/28/13 0937 08/29/13 0325 08/30/13 0345 08/31/13 0342  WBC 12.8* 7.0 12.6* 10.6*  NEUTROABS 9.6*  --   --   --   HGB 14.0 11.7* 12.4 11.7*  HCT 41.1 35.6* 35.8* 36.2  MCV 84.0 84.2 84.8 86.0  PLT 301 275 344 326   Cardiac Enzymes:  Recent Labs Lab 08/28/13 0937  TROPONINI <0.30   BNP (last 3 results)  Recent Labs  08/28/13 0937  PROBNP 118.5   CBG: No results found for this basename: GLUCAP,  in the last 168 hours  Recent Results (from the past 240 hour(s))  RESPIRATORY VIRUS PANEL     Status: Abnormal   Collection Time    08/28/13 12:26 PM      Result Value Ref Range Status   Source - RVPAN NOSE   Final   Respiratory Syncytial Virus A NOT DETECTED   Final   Respiratory Syncytial Virus B NOT DETECTED   Final   Influenza A NOT DETECTED   Final   Influenza B NOT DETECTED   Final  Parainfluenza 1 NOT DETECTED   Final   Parainfluenza 2 NOT DETECTED   Final   Parainfluenza 3 NOT DETECTED   Final   Metapneumovirus DETECTED (*)  Final   Rhinovirus NOT DETECTED   Final   Adenovirus NOT DETECTED   Final   Influenza A H1 NOT DETECTED   Final   Influenza A H3 NOT DETECTED   Final   Comment: (NOTE)           Normal Reference Range for each Analyte: NOT DETECTED     Testing performed using the Luminex xTAG Respiratory Viral Panel test     kit.     This test was developed and its performance characteristics determined     by [facility]. It has not been cleared or approved by the [place]     Food and Drug Administration. This test is used for clinical purposes.     It should not be regarded as investigational or for research. This     laboratory is  certified under the [place] (CLIA) as qualified to perform high complexity     clinical laboratory testing.     Performed at [facility] PCR SCREENING     Status: None   Collection Time    08/28/13  2:02 PM      Result Value Ref Range Status   MRSA by PCR NEGATIVE  NEGATIVE Final   Comment:            The GeneXpert MRSA Assay (FDA     approved for NASAL specimens     only), is one component of a     comprehensive MRSA colonization     surveillance program. It is not     intended to diagnose MRSA     infection nor to guide or     monitor treatment for     MRSA infections.  CULTURE, BLOOD (ROUTINE X 2)     Status: None   Collection Time    08/28/13  4:30 PM      Result Value Ref Range Status   Specimen Description BLOOD RIGHT ARM   Final   Special Requests BOTTLES DRAWN AEROBIC ONLY 1CC   Final   Culture  Setup Time     Final   Value: 08/28/2013 22:33     Performed at [facility]   Culture     Final   Value:        BLOOD CULTURE RECEIVED NO GROWTH TO DATE CULTURE WILL BE HELD FOR 5 DAYS BEFORE ISSUING A FINAL NEGATIVE REPORT     Performed at [facility]   Report Status PENDING   Incomplete  CULTURE, BLOOD (ROUTINE X 2)     Status: None   Collection Time    08/28/13  4:45 PM      Result Value Ref Range Status   Specimen Description BLOOD RIGHT ARM   Final   Special Requests BOTTLES DRAWN AEROBIC ONLY 1CC   Final   Culture  Setup Time     Final   Value: 08/28/2013 22:34     Performed at [facility]   Culture     Final   Value:        BLOOD CULTURE RECEIVED NO GROWTH TO DATE CULTURE WILL BE HELD FOR 5 DAYS BEFORE ISSUING A FINAL NEGATIVE REPORT     Performed at [facility]  Report Status PENDING   Incomplete     Studies: Dg Chest Port 1 View  08/31/2013   CLINICAL DATA:  Pneumonia, shortness of breath.  EXAM: PORTABLE CHEST - 1 VIEW  COMPARISON:  08/28/2013  FINDINGS: There  is hyperinflation of the lungs compatible with COPD. Heart is upper limits normal in size. No confluent airspace opacities currently noted. No effusions. No acute bony abnormality.  IMPRESSION: Improvement in the right basilar opacity. No current confluent airspace opacity.  COPD.   Electronically Signed   By: Rolm Baptise M.D.   On: 08/31/2013 16:29    Scheduled Meds: . acetylcysteine  4 mL Nebulization BID  . enoxaparin (LOVENOX) injection  40 mg Subcutaneous Q24H  . guaiFENesin  600 mg Oral BID  . ipratropium-albuterol  3 mL Nebulization QID  . irbesartan  75 mg Oral Daily  . levofloxacin  750 mg Oral Daily  . methylPREDNISolone (SOLU-MEDROL) injection  60 mg Intravenous Q6H  . multivitamin with minerals  1 tablet Oral Daily   Continuous Infusions:    Principal Problem:   Acute respiratory failure Active Problems:   Respiratory distress   CAP (community acquired pneumonia)   HTN (hypertension)   Leukocytosis, unspecified    Time spent: >35 minutes     Kinnie Feil  [facility] Pager [phone]. If 7PM-7AM, please contact night-coverage at [email or web address], password [facility] 09/02/2013, 9:44 AM  LOS: 5 days

## 2013-09-03 LAB — CULTURE, BLOOD (ROUTINE X 2)
Culture: NO GROWTH
Culture: NO GROWTH

## 2013-09-03 NOTE — Progress Notes (Signed)
[facility] PROGRESS NOTE  Jessica Gilbert QPY:[id number] DOB: [birth year] DOA: 08/28/2013 PCP: No primary provider on file.  Assessment/Plan:  68 y/o female with PMH of [place] s/p R upper lobectomy, s/c chemo/rad presented with SOB is admitted with Pneumonia   1. Acute Respiratory Failure likely due to COPD exacerbation with probable pneumonia   -repeat CXR: improvement in infiltrates; but COPD changes; patient does have h/o tobacco use, COPD but not taking inhalers at home (patient stopped taking advair, spiriva due to ?intolerance) -slowly improving; added IV steroids (3/19); cont atx, added bronchodilators, dulera; cont oxygen; desat study prioor to discharge  2. CAP; CXR: Right basilar infiltrate -improvement in xray;  cont atx; influenza neg;+Metapneumovirus; cont supportive care  3. HTN well controlled  Code Status: full Family Communication: d/w patient, called updated [place],Michael Son [phone]; try later  (indicate person spoken with, relationship, and if by phone, the number) Disposition Plan: home ? 3/22    Consultants:  None   Procedures:  None   Antibiotics: Rocephin  Azithro    (indicate start date, and stop date if known)  HPI/Subjective: alert  Objective: Filed Vitals:   09/03/13 0443  BP: 133/76  Pulse: 88  Temp: 98.6 F (37 C)  Resp: 20    Intake/Output Summary (Last 24 hours) at 09/03/13 0901 Last data filed at 09/03/13 0444  Gross per 24 hour  Intake    600 ml  Output   1675 ml  Net  -1075 ml   Filed Weights   08/28/13 1330 08/29/13 1141  Weight: 61.7 kg (136 lb 0.4 oz) 62.596 kg (138 lb)    Exam:   General:  alert  Cardiovascular: s1,s2 rrr  Respiratory: few rales   Abdomen: soft, nt,nd   Musculoskeletal: no LE edema   Data Reviewed: Basic Metabolic Panel:  Recent Labs Lab 08/28/13 0937 08/29/13 0325 08/30/13 0345  NA 133* 137 141  K 4.2 4.3 4.1  CL 93* 99 101  CO2 26 26 28   GLUCOSE 158* 136* 101*   BUN 9 9 12   CREATININE 0.66 0.53 0.64  CALCIUM 9.4 8.6 8.5   Liver Function Tests:  Recent Labs Lab 08/28/13 0937  AST 41*  ALT 28  ALKPHOS 102  BILITOT 0.4  PROT 7.3  ALBUMIN 3.6   No results found for this basename: LIPASE, AMYLASE,  in the last 168 hours No results found for this basename: AMMONIA,  in the last 168 hours CBC:  Recent Labs Lab 08/28/13 0937 08/29/13 0325 08/30/13 0345 08/31/13 0342  WBC 12.8* 7.0 12.6* 10.6*  NEUTROABS 9.6*  --   --   --   HGB 14.0 11.7* 12.4 11.7*  HCT 41.1 35.6* 35.8* 36.2  MCV 84.0 84.2 84.8 86.0  PLT 301 275 344 326   Cardiac Enzymes:  Recent Labs Lab 08/28/13 0937  TROPONINI <0.30   BNP (last 3 results)  Recent Labs  08/28/13 0937  PROBNP 118.5   CBG: No results found for this basename: GLUCAP,  in the last 168 hours  Recent Results (from the past 240 hour(s))  RESPIRATORY VIRUS PANEL     Status: Abnormal   Collection Time    08/28/13 12:26 PM      Result Value Ref Range Status   Source - RVPAN NOSE   Final   Respiratory Syncytial Virus A NOT DETECTED   Final   Respiratory Syncytial Virus B NOT DETECTED   Final   Influenza A NOT DETECTED   Final  Influenza B NOT DETECTED   Final   Parainfluenza 1 NOT DETECTED   Final   Parainfluenza 2 NOT DETECTED   Final   Parainfluenza 3 NOT DETECTED   Final   Metapneumovirus DETECTED (*)  Final   Rhinovirus NOT DETECTED   Final   Adenovirus NOT DETECTED   Final   Influenza A H1 NOT DETECTED   Final   Influenza A H3 NOT DETECTED   Final   Comment: (NOTE)           Normal Reference Range for each Analyte: NOT DETECTED     Testing performed using the Luminex xTAG Respiratory Viral Panel test     kit.     This test was developed and its performance characteristics determined     by [facility]. It has not been cleared or approved by the [place]     Food and Drug Administration. This test is used for clinical purposes.     It should not be regarded as  investigational or for research. This     laboratory is certified under the [place] (CLIA) as qualified to perform high complexity     clinical laboratory testing.     Performed at [place] PCR SCREENING     Status: None   Collection Time    08/28/13  2:02 PM      Result Value Ref Range Status   MRSA by PCR NEGATIVE  NEGATIVE Final   Comment:            The GeneXpert MRSA Assay (FDA     approved for NASAL specimens     only), is one component of a     comprehensive MRSA colonization     surveillance program. It is not     intended to diagnose MRSA     infection nor to guide or     monitor treatment for     MRSA infections.  CULTURE, BLOOD (ROUTINE X 2)     Status: None   Collection Time    08/28/13  4:30 PM      Result Value Ref Range Status   Specimen Description BLOOD RIGHT ARM   Final   Special Requests BOTTLES DRAWN AEROBIC ONLY 1CC   Final   Culture  Setup Time     Final   Value: 08/28/2013 22:33     Performed at [facility]   Culture     Final   Value:        BLOOD CULTURE RECEIVED NO GROWTH TO DATE CULTURE WILL BE HELD FOR 5 DAYS BEFORE ISSUING A FINAL NEGATIVE REPORT     Performed at [facility]   Report Status PENDING   Incomplete  CULTURE, BLOOD (ROUTINE X 2)     Status: None   Collection Time    08/28/13  4:45 PM      Result Value Ref Range Status   Specimen Description BLOOD RIGHT ARM   Final   Special Requests BOTTLES DRAWN AEROBIC ONLY 1CC   Final   Culture  Setup Time     Final   Value: 08/28/2013 22:34     Performed at [facility]   Culture     Final   Value:        BLOOD CULTURE RECEIVED NO GROWTH TO DATE CULTURE WILL BE HELD FOR 5 DAYS BEFORE ISSUING A FINAL NEGATIVE REPORT  Performed at [facility]   Report Status PENDING   Incomplete     Studies: No results found.  Scheduled Meds: . enoxaparin (LOVENOX) injection  40 mg Subcutaneous Q24H  .  guaiFENesin  600 mg Oral BID  . ipratropium-albuterol  3 mL Nebulization QID  . irbesartan  75 mg Oral Daily  . levofloxacin  750 mg Oral Daily  . methylPREDNISolone (SOLU-MEDROL) injection  60 mg Intravenous Q12H  . mometasone-formoterol  2 puff Inhalation BID  . multivitamin with minerals  1 tablet Oral Daily   Continuous Infusions:    Principal Problem:   Acute respiratory failure Active Problems:   Respiratory distress   CAP (community acquired pneumonia)   HTN (hypertension)   Leukocytosis, unspecified    Time spent: >35 minutes     Kinnie Feil  [facility] Pager [phone]. If 7PM-7AM, please contact night-coverage at [email or web address], password [facility] 09/03/2013, 9:01 AM  LOS: 6 days

## 2013-09-03 NOTE — Progress Notes (Signed)
Pt sats 97% on 2L, titrated o2 to 1L per pt request.  Pt motivated to wean off of O2.  Informed RN and techs at nurse station.

## 2013-09-04 LAB — CREATININE, SERUM
CREATININE: 0.6 mg/dL (ref 0.50–1.10)
GFR calc Af Amer: >90 mL/min (ref >90)
GFR calc non Af Amer: >90 mL/min (ref >90)

## 2013-09-04 MED ORDER — MOMETASONE FURO-FORMOTEROL FUM 200-5 MCG/ACT IN AERO: 2.0000 | INHALATION_SPRAY | Freq: Two times a day (BID) | RESPIRATORY_TRACT | Status: DC

## 2013-09-04 MED ORDER — PREDNISONE 10 MG PO TABS: 10.0000 mg | ORAL_TABLET | Freq: Every day | ORAL | Status: DC

## 2013-09-04 MED ORDER — ALBUTEROL SULFATE (2.5 MG/3ML) 0.083% IN NEBU
2.5000 mg | INHALATION_SOLUTION | RESPIRATORY_TRACT | Status: DC | PRN
Start: 2013-09-04 — End: 2015-07-11

## 2013-09-04 MED ORDER — ALBUTEROL SULFATE HFA 108 (90 BASE) MCG/ACT IN AERS: 2.0000 | INHALATION_SPRAY | Freq: Four times a day (QID) | RESPIRATORY_TRACT | Status: DC | PRN

## 2013-09-04 MED ORDER — TIOTROPIUM BROMIDE MONOHYDRATE 18 MCG IN CAPS: 18.0000 ug | ORAL_CAPSULE | Freq: Every day | RESPIRATORY_TRACT | Status: DC

## 2013-09-04 MED ORDER — IPRATROPIUM BROMIDE 0.02 % IN SOLN: 0.5000 mg | Freq: Four times a day (QID) | RESPIRATORY_TRACT | Status: DC | PRN

## 2013-09-04 MED ORDER — LEVOFLOXACIN 750 MG PO TABS: 750.0000 mg | ORAL_TABLET | Freq: Every day | ORAL | Status: DC

## 2013-09-04 NOTE — Progress Notes (Addendum)
SATURATION QUALIFICATIONS: (This note is used to comply with regulatory documentation for home oxygen)  Patient Saturations on Room Air at Rest = 90%  Patient Saturations on Room Air while Ambulating = 83%  Patient Saturations on 1 Liters of oxygen while Ambulating = 82%  1 Liter at rest 93%  Patient Saturations on 5 Liters of oxygen while Ambulating = 93%  Please briefly explain why patient needs home oxygen: Low oxygen saturation

## 2013-09-04 NOTE — Discharge Instructions (Signed)
Chronic Obstructive Pulmonary Disease  Chronic obstructive pulmonary disease (COPD) is a common lung condition in which airflow from the lungs is limited. COPD is a general term that can be used to describe many different lung problems that limit airflow, including both chronic bronchitis and emphysema.  If you have COPD, your lung function will probably never return to normal, but there are measures you can take to improve lung function and make yourself feel better.   CAUSES   · Smoking (common).    · Exposure to secondhand smoke.    · Genetic problems.  · Chronic inflammatory lung diseases or recurrent infections.  SYMPTOMS   · Shortness of breath, especially with physical activity.    · Deep, persistent (chronic) cough with a large amount of thick mucus.    · Wheezing.    · Rapid breaths (tachypnea).    · Gray or bluish discoloration (cyanosis) of the skin, especially in fingers, toes, or lips.    · Fatigue.    · Weight loss.    · Frequent infections or episodes when breathing symptoms become much worse (exacerbations).    · Chest tightness.  DIAGNOSIS   Your healthcare provider will take a medical history and perform a physical examination to make the initial diagnosis.  Additional tests for COPD may include:   · Lung (pulmonary) function tests.  · Chest X-ray.  · CT scan.  · Blood tests.  TREATMENT   Treatment available to help you feel better when you have COPD include:   · Inhaler and nebulizer medicines. These help manage the symptoms of COPD and make your breathing more comfortable  · Supplemental oxygen. Supplemental oxygen is only helpful if you have a low oxygen level in your blood.    · Exercise and physical activity. These are beneficial for nearly all people with COPD. Some people may also benefit from a pulmonary rehabilitation program.  HOME CARE INSTRUCTIONS   · Take all medicines (inhaled or pills) as directed by your health care provider.  · Only take over-the-counter or prescription medicines  for pain, fever, or discomfort as directed by your health care provider.    · Avoid over-the-counter medicines or cough syrups that dry up your airway (such as antihistamines) and slow down the elimination of secretions unless instructed otherwise by your healthcare provider.    · If you are a smoker, the most important thing that you can do is stop smoking. Continuing to smoke will cause further lung damage and breathing trouble. Ask your health care provider for help with quitting smoking. He or she can direct you to community resources or hospitals that provide support.  · Avoid exposure to irritants such as smoke, chemicals, and fumes that aggravate your breathing.  · Use oxygen therapy and pulmonary rehabilitation if directed by your health care provider. If you require home oxygen therapy, ask your healthcare provider whether you should purchase a pulse oximeter to measure your oxygen level at home.    · Avoid contact with individuals who have a contagious illness.  · Avoid extreme temperature and humidity changes.  · Eat healthy foods. Eating smaller, more frequent meals and resting before meals may help you maintain your strength.  · Stay active, but balance activity with periods of rest. Exercise and physical activity will help you maintain your ability to do things you want to do.  · Preventing infection and hospitalization is very important when you have COPD. Make sure to receive all the vaccines your health care provider recommends, especially the pneumococcal and influenza vaccines. Ask your healthcare provider whether you   need a pneumonia vaccine.  · Learn and use relaxation techniques to manage stress.  · Learn and use controlled breathing techniques as directed by your health care provider. Controlled breathing techniques include:    · Pursed lip breathing. Start by breathing in (inhaling) through your nose for 1 second. Then, purse your lips as if you were going to whistle and breathe out (exhale)  through the pursed lips for 2 seconds.    · Diaphragmatic breathing. Start by putting one hand on your abdomen just above your waist. Inhale slowly through your nose. The hand on your abdomen should move out. Then purse your lips and exhale slowly. You should be able to feel the hand on your abdomen moving in as you exhale.    · Learn and use controlled coughing to clear mucus from your lungs. Controlled coughing is a series of short, progressive coughs. The steps of controlled coughing are:    1. Lean your head slightly forward.    2. Breathe in deeply using diaphragmatic breathing.    3. Try to hold your breath for 3 seconds.    4. Keep your mouth slightly open while coughing twice.    5. Spit any mucus out into a tissue.    6. Rest and repeat the steps once or twice as needed.  SEEK MEDICAL CARE IF:   · You are coughing up more mucus than usual.    · There is a change in the color or thickness of your mucus.    · Your breathing is more labored than usual.    · Your breathing is faster than usual.    SEEK IMMEDIATE MEDICAL CARE IF:   · You have shortness of breath while you are resting.    · You have shortness of breath that prevents you from:  · Being able to talk.    · Performing your usual physical activities.    · You have chest pain lasting longer than 5 minutes.    · Your skin color is more cyanotic than usual.  · You measure low oxygen saturations for longer than 5 minutes with a pulse oximeter.  MAKE SURE YOU:   · Understand these instructions.  · Will watch your condition.  · Will get help right away if you are not doing well or get worse.  Document Released: 03/12/2005 Document Revised: 03/23/2013 Document Reviewed: 01/27/2013  [employer]® Patient Information ©2014 [employer].

## 2013-09-04 NOTE — Progress Notes (Signed)
09/04/13 1110 Reviewed discharge instructions with patient. Patient verbalized understanding of discharge. Copy of discharge papers and prescriptions given to patient.  [facility] brought oxygen tank for patient.

## 2013-09-04 NOTE — Discharge Summary (Addendum)
Physician Discharge Summary  Jessica Gilbert RCV:[id number] DOB: [birth year] DOA: 08/28/2013  PCP: No primary provider on file.  Admit date: 08/28/2013 Discharge date: 09/04/2013  Time spent: >35 minutes  Recommendations for Outpatient Follow-up:  F/u with PCP in 1-2 weeks F/u with pulmonologist in 3-4 weeks Discharge Diagnoses:  Principal Problem:   Acute respiratory failure Active Problems:   Respiratory distress   CAP (community acquired pneumonia)   HTN (hypertension)   Leukocytosis, unspecified   Discharge Condition: stable   Diet recommendation: heart healthy   Filed Weights   08/28/13 1330 08/29/13 1141  Weight: 61.7 kg (136 lb 0.4 oz) 62.596 kg (138 lb)    History of present illness:  68 y/o female with PMH of [place] CA s/p R upper lobectomy, s/c chemo/rad presented with SOB is admitted with Pneumonia   Hospital Course:  1. Acute Respiratory Failure likely due to COPD exacerbation with probable pneumonia  -repeat CXR: improvement in infiltrates; COPD changes;  -patient does have h/o tobacco use and COPD but not taking inhalers at home (patient stopped taking advair, spiriva due to ?intolerance, she was [place] recommended home oxygen in the past)  -slowly improved on steroids, atx, added bronchodilators, dulera; home oxygen upon d/c  -recommended PFTs outpatient, and pulmonology follow up in 3-4 week  2. CAP; CXR: Right basilar infiltrate  -improvement in xray; cont atx; influenza neg;+Metapneumovirus; afebrile; to complete OP atx   3. HTN well controlled    Procedures:  none (i.e. Studies not automatically included, echos, thoracentesis, etc; not x-rays)  Consultations:  none  Discharge Exam: Filed Vitals:   09/04/13 0453  BP: 139/80  Pulse: 91  Temp: 98.1 F (36.7 C)  Resp: 20    General: alert Cardiovascular: s1,s2 rrr Respiratory: CTA BL  Discharge Instructions  Discharge Orders   Future Orders Complete By Expires   Diet - low sodium heart  healthy  As directed    Discharge instructions  As directed    Comments:     Please follow up with primary care doctor in 1-2 weeks Please follow up with pulmonologist in 3-4 weeks   Increase activity slowly  As directed        Medication List         albuterol 108 (90 BASE) MCG/ACT inhaler  Commonly known as:  PROVENTIL HFA;VENTOLIN HFA  Inhale 2 puffs into the lungs every 6 (six) hours as needed for wheezing or shortness of breath.     albuterol (2.5 MG/3ML) 0.083% nebulizer solution  Commonly known as:  PROVENTIL  Take 3 mLs (2.5 mg total) by nebulization every 2 (two) hours as needed for wheezing or shortness of breath.     ibuprofen 200 MG tablet  Commonly known as:  ADVIL,MOTRIN  Take 200 mg by mouth every 6 (six) hours as needed.     ipratropium 0.02 % nebulizer solution  Commonly known as:  ATROVENT  Take 2.5 mLs (0.5 mg total) by nebulization every 6 (six) hours as needed for wheezing or shortness of breath.     levofloxacin 750 MG tablet  Commonly known as:  LEVAQUIN  Take 1 tablet (750 mg total) by mouth daily.     loratadine-pseudoephedrine 5-120 MG per tablet  Commonly known as:  CLARITIN-D 12-hour  Take 1 tablet by mouth 2 (two) times daily.     mometasone-formoterol 200-5 MCG/ACT Aero  Commonly known as:  DULERA  Inhale 2 puffs into the lungs 2 (two) times daily.     multivitamin with  minerals Tabs tablet  Take 1 tablet by mouth daily.     predniSONE 10 MG tablet  Commonly known as:  DELTASONE  Take 1 tablet (10 mg total) by mouth daily with breakfast.     telmisartan 40 MG tablet  Commonly known as:  MICARDIS  Take 40 mg by mouth daily.     tiotropium 18 MCG inhalation capsule  Commonly known as:  SPIRIVA HANDIHALER  Place 1 capsule (18 mcg total) into inhaler and inhale daily.       Allergies  Allergen Reactions  . Aspirin Other (See Comments)    "burning stomach"  . Codeine Nausea Only      The results of significant diagnostics from  this hospitalization (including imaging, microbiology, ancillary and laboratory) are listed below for reference.    Significant Diagnostic Studies: Dg Chest Port 1 View  08/31/2013   CLINICAL DATA:  Pneumonia, shortness of breath.  EXAM: PORTABLE CHEST - 1 VIEW  COMPARISON:  08/28/2013  FINDINGS: There is hyperinflation of the lungs compatible with COPD. Heart is upper limits normal in size. No confluent airspace opacities currently noted. No effusions. No acute bony abnormality.  IMPRESSION: Improvement in the right basilar opacity. No current confluent airspace opacity.  COPD.   Electronically Signed   By: Rolm Baptise M.D.   On: 08/31/2013 16:29   Dg Chest Portable 1 View  08/28/2013   CLINICAL DATA:  Respiratory distress  EXAM: PORTABLE CHEST - 1 VIEW  COMPARISON:  05/11/2013  FINDINGS: Cardiac shadow is stable. The lungs are well aerated bilaterally. Patchy infiltrative density is noted in the right lung base laterally. No other focal infiltrate is seen.  IMPRESSION: Right basilar infiltrate.   Electronically Signed   By: Inez Catalina M.D.   On: 08/28/2013 10:04    Microbiology: Recent Results (from the past 240 hour(s))  RESPIRATORY VIRUS PANEL     Status: Abnormal   Collection Time    08/28/13 12:26 PM      Result Value Ref Range Status   Source - RVPAN NOSE   Final   Respiratory Syncytial Virus A NOT DETECTED   Final   Respiratory Syncytial Virus B NOT DETECTED   Final   Influenza A NOT DETECTED   Final   Influenza B NOT DETECTED   Final   Parainfluenza 1 NOT DETECTED   Final   Parainfluenza 2 NOT DETECTED   Final   Parainfluenza 3 NOT DETECTED   Final   Metapneumovirus DETECTED (*)  Final   Rhinovirus NOT DETECTED   Final   Adenovirus NOT DETECTED   Final   Influenza A H1 NOT DETECTED   Final   Influenza A H3 NOT DETECTED   Final   Comment: (NOTE)           Normal Reference Range for each Analyte: NOT DETECTED     Testing performed using the Luminex xTAG Respiratory Viral  Panel test     kit.     This test was developed and its performance characteristics determined     by [facility]. It has not been cleared or approved by the [place]     Food and Drug Administration. This test is used for clinical purposes.     It should not be regarded as investigational or for research. This     laboratory is certified under the [place] (CLIA) as qualified to perform high complexity  clinical laboratory testing.     Performed at [facility] PCR SCREENING     Status: None   Collection Time    08/28/13  2:02 PM      Result Value Ref Range Status   MRSA by PCR NEGATIVE  NEGATIVE Final   Comment:            The GeneXpert MRSA Assay (FDA     approved for NASAL specimens     only), is one component of a     comprehensive MRSA colonization     surveillance program. It is not     intended to diagnose MRSA     infection nor to guide or     monitor treatment for     MRSA infections.  CULTURE, BLOOD (ROUTINE X 2)     Status: None   Collection Time    08/28/13  4:30 PM      Result Value Ref Range Status   Specimen Description BLOOD RIGHT ARM   Final   Special Requests BOTTLES DRAWN AEROBIC ONLY 1CC   Final   Culture  Setup Time     Final   Value: 08/28/2013 22:33     Performed at [facility]   Culture     Final   Value: NO GROWTH 5 DAYS     Performed at [facility]   Report Status 09/03/2013 FINAL   Final  CULTURE, BLOOD (ROUTINE X 2)     Status: None   Collection Time    08/28/13  4:45 PM      Result Value Ref Range Status   Specimen Description BLOOD RIGHT ARM   Final   Special Requests BOTTLES DRAWN AEROBIC ONLY 1CC   Final   Culture  Setup Time     Final   Value: 08/28/2013 22:34     Performed at [facility]   Culture     Final   Value: NO GROWTH 5 DAYS     Performed at [facility]   Report Status 09/03/2013 FINAL   Final     Labs: Basic Metabolic  Panel:  Recent Labs Lab 08/28/13 0937 08/29/13 0325 08/30/13 0345 09/04/13 0347  NA 133* 137 141  --   K 4.2 4.3 4.1  --   CL 93* 99 101  --   CO2 _0 --   GLUCOSE 158* 136* 101*  --   BUN _1 --   CREATININE 0.66 0.53 0.64 0.60  CALCIUM 9.4 8.6 8.5  --    Liver Function Tests:  Recent Labs Lab 08/28/13 0937  AST 41*  ALT 28  ALKPHOS 102  BILITOT 0.4  PROT 7.3  ALBUMIN 3.6   No results found for this basename: LIPASE, AMYLASE,  in the last 168 hours No results found for this basename: AMMONIA,  in the last 168 hours CBC:  Recent Labs Lab 08/28/13 0937 08/29/13 0325 08/30/13 0345 08/31/13 0342  WBC 12.8* 7.0 12.6* 10.6*  NEUTROABS 9.6*  --   --   --   HGB 14.0 11.7* 12.4 11.7*  HCT 41.1 35.6* 35.8* 36.2  MCV 84.0 84.2 84.8 86.0  PLT 301 275 344 326   Cardiac Enzymes:  Recent Labs Lab 08/28/13 0937  TROPONINI <0.30   BNP: BNP (last 3 results)  Recent Labs  08/28/13 0937  PROBNP 118.5   CBG: No results found for this basename: GLUCAP,  in the last  168 hours     Signed:  Rowe Clack N  [facility] 09/04/2013, 8:26 AM

## 2013-09-04 NOTE — Progress Notes (Signed)
   CARE MANAGEMENT NOTE 09/04/2013  Patient:  Jessica Gilbert, Jessica Gilbert   Account Number:  [id number]  Date Initiated:  08/29/2013  Documentation initiated by:  DAVIS,RHONDA  Subjective/Objective Assessment:   pt with resp distress and increased wob, hx of copd.     Action/Plan:   patient being transferred to med-surg floor on [id number]/lives at  home alone.   Anticipated DC Date:  08/29/2013   Anticipated DC Plan:  HOME/SELF CARE  In-house referral  NA      DC Planning Services  CM consult      PAC Choice  NA   Choice offered to / List presented to:  NA   DME arranged  OXYGEN      DME agency  [place] arranged  NA      [place] agency  NA   Status of service:  Completed, signed off Medicare Important Message given?  NA - LOS <3 / Initial given by admissions (If response is "NO", the following Medicare IM given date fields will be blank) Date Medicare IM given:   Date Additional Medicare IM given:    Discharge Disposition:  HOME/SELF CARE  Per UR Regulation:  Reviewed for med. necessity/level of care/duration of stay  If discussed at [place] of Stay Meetings, dates discussed:    Comments:  09/04/2013 1100 NCM contacted [place] for oxygen for home. Jonnie Finner RN CCM Case Mgmt phone [phone]  [phone] Eldridge Dace, BSN, [place] [phone] Chart Reviewed for discharge and hospital needs. Discharge needs at time of review:  None present will follow for needs. Review of patient progress due on [id number].

## 2013-10-10 ENCOUNTER — Ambulatory Visit (INDEPENDENT_AMBULATORY_CARE_PROVIDER_SITE_OTHER): Payer: Managed Care, Other (non HMO) | Admitting: Pulmonary Disease

## 2013-10-10 ENCOUNTER — Other Ambulatory Visit: Payer: Managed Care, Other (non HMO)

## 2013-10-10 ENCOUNTER — Encounter: Payer: Self-pay | Admitting: *Deleted

## 2013-10-10 VITALS — BP 120/80 | HR 88 | Ht 65.0 in | Wt 137.0 lb

## 2013-10-10 DIAGNOSIS — J438 Other emphysema: Secondary | ICD-10-CM

## 2013-10-10 DIAGNOSIS — J1289 Other viral pneumonia: Secondary | ICD-10-CM

## 2013-10-10 DIAGNOSIS — J439 Emphysema, unspecified: Secondary | ICD-10-CM | POA: Insufficient documentation

## 2013-10-10 DIAGNOSIS — J123 Human metapneumovirus pneumonia: Secondary | ICD-10-CM

## 2013-10-10 NOTE — Assessment & Plan Note (Signed)
This was in March 2015.  Clinically recovered.  She needed oxygen after hospital discharge.  Her oxygen levels have improved.  Will arrange for overnight oximetry to ensure that she does not need oxygen at night.

## 2013-10-10 NOTE — Assessment & Plan Note (Signed)
She has extensive prior history of smoking.  She has changes of emphysema on previous CT chest imaging.   To further assess will arrange for pulmonary function testing and alpha 1 testing.   Discussed option of pulmonary rehab referral >> this would not fit with her work schedule.  She is to continue dulera and prn albuterol.  She was intolerant of dry powder inhalers.

## 2013-10-10 NOTE — Progress Notes (Deleted)
   Subjective:    Patient ID: Jessica Gilbert, female    DOB: 23-May-1946, 68 y.o.   MRN: [id number]  HPI    Review of Systems  Constitutional: Negative for fever, chills, diaphoresis, activity change, appetite change, fatigue and unexpected weight change.  HENT: Positive for congestion, postnasal drip, rhinorrhea, sinus pressure and sneezing. Negative for dental problem, ear discharge, ear pain, facial swelling, hearing loss, mouth sores, nosebleeds, sore throat, tinnitus, trouble swallowing and voice change.   Eyes: Negative for photophobia, discharge, itching and visual disturbance.  Respiratory: Positive for cough and shortness of breath. Negative for apnea, choking, chest tightness, wheezing and stridor.   Cardiovascular: Negative for chest pain, palpitations and leg swelling.  Gastrointestinal: Negative for nausea, vomiting, abdominal pain, constipation, blood in stool and abdominal distention.  Genitourinary: Negative for dysuria, urgency, frequency, hematuria, flank pain, decreased urine volume and difficulty urinating.  Musculoskeletal: Negative for arthralgias, back pain, gait problem, joint swelling, myalgias, neck pain and neck stiffness.  Skin: Negative for color change, pallor and rash.  Neurological: Negative for dizziness, tremors, seizures, syncope, speech difficulty, weakness, light-headedness, numbness and headaches.  Hematological: Negative for adenopathy. Does not bruise/bleed easily.  Psychiatric/Behavioral: Negative for confusion, sleep disturbance and agitation. The patient is not nervous/anxious.        Objective:   Physical Exam        Assessment & Plan:

## 2013-10-10 NOTE — Progress Notes (Signed)
Chief Complaint  Patient presents with  . Pulmonary Consult    referred by Dr. Rex Kras    History of Present Illness: Jessica Gilbert is a 68 y.o. female former smoker evaluation of COPD.  She has prior hx of lung cancer s/p Rt upper lobectomy with neoadjuvant chemo/XRT in 2005.  She was in hospital in March with PNA with metapneumovirus and COPD exacerbation.  She was previously seen by Dr. Melvyn Novas in 2005 when she was found to have lung cancer.  She was advised to have further pulmonary assessment after her recent hospital stay.  She has been doing better over the past few weeks.  She is using dulera bid, and this helps.  She was on spiriva before >> the dry powder caused a cough.  She is not needing to use albuterol much.  She gets occasional cough.  She is not having wheeze, chest tightness, or sputum.  She denies fever or hemoptysis.  She denies chest pain or leg swelling.  She was sent home with oxygen, but has not been using.  She gets winded if she walks up several flights of stairs, but does okay on level ground.  She works as an Optometrist.  She is from [place].  She denies prior history of pneumonia or exposure to tuberculosis.  She has two pet cats.  [place]  Her sister also has emphysema.   Jessica Gilbert  has a past medical history of Lung cancer (2005); Acute respiratory failure; Celiac disease; Human metapneumovirus pneumonia (March 2015); Emphysema lung; Hypertension; Stroke; Hyperlipidemia; and Carotid stenosis, right.  Jessica Gilbert  has past surgical history that includes Lung removal, partial (Right, 2005); Trigger finger release; and Tubal ligation (1980).   Prior to Admission medications   Medication Sig Start Date End Date Taking? Authorizing Provider  albuterol (PROVENTIL HFA;VENTOLIN HFA) 108 (90 BASE) MCG/ACT inhaler Inhale 2 puffs into the lungs every 6 (six) hours as needed for wheezing or shortness of breath. 09/04/13  Yes Kinnie Feil, MD  albuterol (PROVENTIL) (2.5 MG/3ML) 0.083% nebulizer solution Take 3 mLs (2.5 mg total) by nebulization every 2 (two) hours as needed for wheezing or shortness of breath. 09/04/13  Yes Kinnie Feil, MD  ibuprofen (ADVIL,MOTRIN) 200 MG tablet Take 200 mg by mouth every 6 (six) hours as needed.   Yes Historical Provider, MD  ipratropium (ATROVENT) 0.02 % nebulizer solution Take 2.5 mLs (0.5 mg total) by nebulization every 6 (six) hours as needed for wheezing or shortness of breath. 09/04/13  Yes Kinnie Feil, MD  loratadine-pseudoephedrine (CLARITIN-D 12-HOUR) 5-120 MG per tablet Take 1 tablet by mouth as needed.    Yes Historical Provider, MD  mometasone-formoterol (DULERA) 200-5 MCG/ACT AERO Inhale 2 puffs into the lungs 2 (two) times daily. 09/04/13  Yes Kinnie Feil, MD  Multiple Vitamin (MULTIVITAMIN WITH MINERALS) TABS tablet Take 1 tablet by mouth daily.   Yes Historical Provider, MD  telmisartan (MICARDIS) 40 MG tablet Take 40 mg by mouth daily.   Yes Historical Provider, MD    Allergies  Allergen Reactions  . Aspirin Other (See Comments)    "burning stomach"  . Codeine Nausea Only    Her family history includes Colon polyps in her mother and sister; Congestive Heart Failure in her father; Diabetes in her brother; Emphysema in her sister; Heart disease in her brother and father; High blood pressure in her brother, father, and sister; Leukemia in her mother.  She  reports that she quit smoking about 10 years ago. Her smoking use included Cigarettes. She has a 35 pack-year smoking history. She has never used smokeless tobacco. She reports that she does not drink alcohol or use illicit drugs.  Review of Systems  Constitutional: Negative for fever, chills, diaphoresis, activity change, appetite change, fatigue and unexpected weight change.  HENT: Positive for congestion, postnasal drip, rhinorrhea, sinus pressure and sneezing. Negative for dental problem, ear discharge,  ear pain, facial swelling, hearing loss, mouth sores, nosebleeds, sore throat, tinnitus, trouble swallowing and voice change.   Eyes: Negative for photophobia, discharge, itching and visual disturbance.  Respiratory: Positive for cough and shortness of breath. Negative for apnea, choking, chest tightness, wheezing and stridor.   Cardiovascular: Negative for chest pain, palpitations and leg swelling.  Gastrointestinal: Negative for nausea, vomiting, abdominal pain, constipation, blood in stool and abdominal distention.  Genitourinary: Negative for dysuria, urgency, frequency, hematuria, flank pain, decreased urine volume and difficulty urinating.  Musculoskeletal: Negative for arthralgias, back pain, gait problem, joint swelling, myalgias, neck pain and neck stiffness.  Skin: Negative for color change, pallor and rash.  Neurological: Negative for dizziness, tremors, seizures, syncope, speech difficulty, weakness, light-headedness, numbness and headaches.  Hematological: Negative for adenopathy. Does not bruise/bleed easily.  Psychiatric/Behavioral: Negative for confusion, sleep disturbance and agitation. The patient is not nervous/anxious.    Physical Exam:  General - No distress ENT - No sinus tenderness, no oral exudate, no LAN, no thyromegaly, TM clear, pupils equal/reactive Cardiac - s1s2 regular, no murmur, pulses symmetric Chest - No wheeze/rales/dullness, good air entry, normal respiratory excursion Back - No focal tenderness Abd - Soft, non-tender, no organomegaly, + bowel sounds Ext - No edema Neuro - Normal strength, cranial nerves intact Skin - No rashes Psych - Normal mood, and behavior   Dg Chest Portable 1 View  08/28/2013   CLINICAL DATA:  Respiratory distress  EXAM: PORTABLE CHEST - 1 VIEW  COMPARISON:  05/11/2013  FINDINGS: Cardiac shadow is stable. The lungs are well aerated bilaterally. Patchy infiltrative density is noted in the right lung base laterally. No other focal  infiltrate is seen.  IMPRESSION: Right basilar infiltrate.   Electronically Signed   By: Inez Catalina M.D.   On: 08/28/2013 10:04    Lab Results  Component Value Date   WBC 10.6* 08/31/2013   HGB 11.7* 08/31/2013   HCT 36.2 08/31/2013   MCV 86.0 08/31/2013   PLT 326 08/31/2013    Lab Results  Component Value Date   CREATININE 0.60 09/04/2013   BUN 12 08/30/2013   NA 141 08/30/2013   K 4.1 08/30/2013   CL 101 08/30/2013   CO2 28 08/30/2013    Lab Results  Component Value Date   ALT 28 08/28/2013   AST 41* 08/28/2013   ALKPHOS 102 08/28/2013   BILITOT 0.4 08/28/2013   BNP    Component Value Date/Time   PROBNP 118.5 08/28/2013 9191      Assessment/Plan:  Chesley Mires, MD [place] [facility]/Critical Care/Sleep Pager:  [phone]

## 2013-10-10 NOTE — Patient Instructions (Signed)
Will arrange for overnight oxygen test Will arrange for breathing test (PFT) Lab test today Follow up in 4 months

## 2013-10-18 LAB — ALPHA-1 ANTITRYPSIN PHENOTYPE: A-1 Antitrypsin: 152 mg/dL (ref 83–199)

## 2013-10-19 ENCOUNTER — Telehealth: Payer: Self-pay | Admitting: Pulmonary Disease

## 2013-10-19 NOTE — Telephone Encounter (Signed)
A1AT 10/10/13 >> 152, MM  Will have my nurse inform pt that lab test for emphysema was normal.  She does not have inherited form of emphysema.

## 2013-10-19 NOTE — Telephone Encounter (Signed)
lmtcb x1 

## 2013-10-20 NOTE — Telephone Encounter (Signed)
Pt is aware of results. 

## 2013-11-01 ENCOUNTER — Telehealth: Payer: Self-pay | Admitting: Pulmonary Disease

## 2013-11-01 NOTE — Telephone Encounter (Signed)
lmtcb x1 

## 2013-11-01 NOTE — Telephone Encounter (Signed)
ONO with RA 10/25/13 >> Test time 2 hrs 53 min.  Basal SpO2 91.4%, low SpO2 84%.  Spent 0.5 min with SpO2 < 88%.  Will have my nurse inform pt that ONO looked good.  She does not need oxygen at night.  She can have home oxygen set up discontinued and removed from home.

## 2013-11-02 NOTE — Telephone Encounter (Signed)
Spoke with the pt and notified of recs per VS She verbalized understanding  She states does not have o2 at home and so no need to send order

## 2013-11-02 NOTE — Telephone Encounter (Signed)
Pt returning call to lindsay. Please call back at [phone] ext [phone]

## 2014-01-04 ENCOUNTER — Encounter: Payer: Self-pay | Admitting: Pulmonary Disease

## 2014-02-16 ENCOUNTER — Encounter: Payer: Self-pay | Admitting: Pulmonary Disease

## 2014-02-16 ENCOUNTER — Ambulatory Visit (INDEPENDENT_AMBULATORY_CARE_PROVIDER_SITE_OTHER): Payer: Managed Care, Other (non HMO) | Admitting: Pulmonary Disease

## 2014-02-16 VITALS — BP 130/76 | HR 85 | Ht 63.0 in | Wt 141.0 lb

## 2014-02-16 DIAGNOSIS — J439 Emphysema, unspecified: Secondary | ICD-10-CM

## 2014-02-16 DIAGNOSIS — J432 Centrilobular emphysema: Secondary | ICD-10-CM

## 2014-02-16 DIAGNOSIS — J438 Other emphysema: Secondary | ICD-10-CM

## 2014-02-16 LAB — PULMONARY FUNCTION TEST
DL/VA % PRED: 57 %
DL/VA: 2.69 ml/min/mmHg/L
DLCO unc % pred: 52 %
DLCO unc: 12.06 ml/min/mmHg
FEF 25-75 POST: 0.44 L/s
FEF 25-75 Pre: 0.37 L/sec
FEF2575-%CHANGE-POST: 18 %
FEF2575-%Pred-Post: 22 %
FEF2575-%Pred-Pre: 18 %
FEV1-%Change-Post: 8 %
FEV1-%PRED-PRE: 33 %
FEV1-%Pred-Post: 36 %
FEV1-POST: 0.82 L
FEV1-PRE: 0.76 L
FEV1FVC-%CHANGE-POST: 2 %
FEV1FVC-%PRED-PRE: 46 %
FEV6-%CHANGE-POST: 5 %
FEV6-%PRED-POST: 76 %
FEV6-%Pred-Pre: 72 %
FEV6-Post: 2.18 L
FEV6-Pre: 2.06 L
FEV6FVC-%Change-Post: 0 %
FEV6FVC-%Pred-Post: 100 %
FEV6FVC-%Pred-Pre: 99 %
FVC-%CHANGE-POST: 5 %
FVC-%PRED-POST: 76 %
FVC-%Pred-Pre: 72 %
FVC-POST: 2.27 L
FVC-Pre: 2.16 L
POST FEV1/FVC RATIO: 36 %
PRE FEV1/FVC RATIO: 35 %
Post FEV6/FVC ratio: 96 %
Pre FEV6/FVC Ratio: 96 %
RV % pred: 159 %
RV: 3.34 L
TLC % pred: 123 %
TLC: 6.03 L

## 2014-02-16 NOTE — Assessment & Plan Note (Signed)
Reviewed her PFT's with her.  Had extensive discussion about implications of her COPD.  She will get her flu shot at work.  Discussed option of pulmonary rehab >> this will not work at this time due to her employment schedule.  I did encourage her to maintain her activity level as tolerated.  She is to continue dulera and prn albuterol.  Explained that while she does not need supplemental oxygen at this time, that she will need periodic monitoring of her oxygen levels.

## 2014-02-16 NOTE — Patient Instructions (Signed)
Follow up in 6 months 

## 2014-02-16 NOTE — Progress Notes (Signed)
PFT done today. 

## 2014-02-16 NOTE — Progress Notes (Signed)
Chief Complaint  Patient presents with  . Follow-up    Review PFT. Denies any increased breathing issues.     History of Present Illness: Jessica Gilbert is a 68 y.o. female former smoker severe COPD/emphysema.  She has prior hx of lung cancer s/p Rt upper lobectomy with neoadjuvant chemo/XRT in 2005.  She is here to review her recent lab tests, ONO.  She had PFT today >> this showed severe obstruction, airtrapping, and moderate diffusion defect.  She is doing okay with her breathing.  She tries to stay active.  She has noticed getting ankle swelling in the afternoons.  She is not having sputum, chest pain, fever, or hemoptysis.  She is using her inhalers and these help.  She will get her flu shot at work.  TESTS: A1AT 10/10/13 >> 152, MM ONO with RA 10/25/13 >> Test time 2 hrs 53 min. Basal SpO2 91.4%, low SpO2 84%. Spent 0.5 min with SpO2 < 88%. PFT 02/16/14 >> FEV1 0.82 (36%), FEV1% 36, TLC 6.03 (123%), RV 3.34 (159%), DLCO 52%  PMHx, PSHx, Medications, Allergies, Fhx, Shx reviewed.  Physical Exam:  General - No distress ENT - No sinus tenderness, no oral exudate, wears glasses Cardiac - s1s2 regular, no murmur Chest - decreased breath sounds, no wheeze Back - No focal tenderness Abd - Soft, non-tender Ext - No edema Neuro - Normal strength Skin - No rashes Psych - Normal mood, and behavior  Assessment/Plan:  Chesley Mires, MD [place] [facility]monary/Critical Care/Sleep Pager:  [phone]

## 2014-02-27 ENCOUNTER — Ambulatory Visit
Admission: RE | Admit: 2014-02-27 | Discharge: 2014-02-27 | Disposition: A | Discharge status: Home / Self Care | Payer: Managed Care, Other (non HMO) | Source: Ambulatory Visit | Attending: Family Medicine | Admitting: Family Medicine

## 2014-02-27 ENCOUNTER — Other Ambulatory Visit: Payer: Self-pay | Admitting: Family Medicine

## 2014-02-27 DIAGNOSIS — R059 Cough, unspecified: Secondary | ICD-10-CM

## 2014-02-27 DIAGNOSIS — J189 Pneumonia, unspecified organism: Secondary | ICD-10-CM | POA: Insufficient documentation

## 2014-02-27 DIAGNOSIS — R509 Fever, unspecified: Secondary | ICD-10-CM

## 2014-02-27 DIAGNOSIS — R05 Cough: Secondary | ICD-10-CM

## 2014-03-10 ENCOUNTER — Other Ambulatory Visit: Payer: Self-pay | Admitting: Family Medicine

## 2014-03-10 DIAGNOSIS — R9389 Abnormal findings on diagnostic imaging of other specified body structures: Secondary | ICD-10-CM

## 2014-03-14 ENCOUNTER — Ambulatory Visit
Admission: RE | Admit: 2014-03-14 | Discharge: 2014-03-14 | Disposition: A | Discharge status: Home / Self Care | Payer: Managed Care, Other (non HMO) | Source: Ambulatory Visit | Attending: Family Medicine | Admitting: Family Medicine

## 2014-03-14 DIAGNOSIS — R9389 Abnormal findings on diagnostic imaging of other specified body structures: Secondary | ICD-10-CM

## 2014-03-14 MED ORDER — IOHEXOL 300 MG/ML  SOLN
75.0000 mL | Freq: Once | INTRAMUSCULAR | Status: AC | PRN
  Administered 2014-03-14: 75 mL via INTRAVENOUS

## 2014-03-30 ENCOUNTER — Encounter: Payer: Self-pay | Admitting: Adult Health

## 2014-03-30 ENCOUNTER — Ambulatory Visit (INDEPENDENT_AMBULATORY_CARE_PROVIDER_SITE_OTHER): Payer: Managed Care, Other (non HMO) | Admitting: Adult Health

## 2014-03-30 ENCOUNTER — Ambulatory Visit (INDEPENDENT_AMBULATORY_CARE_PROVIDER_SITE_OTHER)
Admission: RE | Admit: 2014-03-30 | Discharge: 2014-03-30 | Disposition: A | Discharge status: Home / Self Care | Payer: Managed Care, Other (non HMO) | Source: Ambulatory Visit | Attending: Adult Health | Admitting: Adult Health

## 2014-03-30 VITALS — BP 140/78 | HR 74 | Temp 97.9°F | Ht 63.0 in | Wt 142.0 lb

## 2014-03-30 DIAGNOSIS — J189 Pneumonia, unspecified organism: Secondary | ICD-10-CM

## 2014-03-30 NOTE — Progress Notes (Signed)
   Subjective:    Patient ID: Jessica Gilbert, female    DOB: Dec 21, 1945, 68 y.o.   MRN: [id number]  HPI 68 yo former smoker severe COPD/emphysema.  She has prior hx of lung cancer s/p Rt upper lobectomy with neoadjuvant chemo/XRT in 2005. PFT 02/16/2014 >severe obstruction, airtrapping, and moderate diffusion defect.  TESTS: A1AT 10/10/13 >> 152, MM ONO with RA 10/25/13 >> Test time 2 hrs 53 min. Basal SpO2 91.4%, low SpO2 84%. Spent 0.5 min with SpO2 < 88%. PFT 02/16/14 >> FEV1 0.82 (36%), FEV1% 36, TLC 6.03 (123%), RV 3.34 (159%), DLCO 52%   03/30/2014 Follow up  Complains of 4-5 weeks ago, developed fever, chills, body aches and cough with weaknees. Seen at PCP dx PNA tx w/ Levaquin x 10 days . Started to feel better but still weak after abx.  Initial chest xray showed extensive pnuemonia along LLL  Pt was sent for CT chest on 03/14/14 that showed persistent large  Aspdz in LLL  W/ some generalized improvement but progressive consolidation posteriorly.  Pt is feeling better w/ decreased cough and congesiton  Good appetitie and no fever or n/v/d.  No hemoptysis , chest pain , orthopnea or edema.  She remains on [facility] Twice daily  .  Did have PNA earlier this year in march on the right. That cleared on follow up cxr .  Denies dysphagia or GERD.  PVX is utd <1 yr ago ,         Review of Systems Constitutional:   No  weight loss, night sweats,  Fevers, chills, + fatigue, or  lassitude.  HEENT:   No headaches,  Difficulty swallowing,  Tooth/dental problems, or  Sore throat,                No sneezing, itching, ear ache, nasal congestion, post nasal drip,   CV:  No chest pain,  Orthopnea, PND, swelling in lower extremities, anasarca, dizziness, palpitations, syncope.   GI  No heartburn, indigestion, abdominal pain, nausea, vomiting, diarrhea, change in bowel habits, loss of appetite, bloody stools.   Resp:  .  No chest wall deformity  Skin: no rash or lesions.  GU: no dysuria,  change in color of urine, no urgency or frequency.  No flank pain, no hematuria   MS:  No joint pain or swelling.  No decreased range of motion.  No back pain.  Psych:  No change in mood or affect. No depression or anxiety.  No memory loss.         Objective:   Physical Exam GEN: A/Ox3; pleasant , NAD, thin female   HEENT:  [place]/AT,  EACs-clear, TMs-wnl, NOSE-clear, THROAT-clear, no lesions, no postnasal drip or exudate noted.   NECK:  Supple w/ fair ROM; no JVD; normal carotid impulses w/o bruits; no thyromegaly or nodules palpated; no lymphadenopathy.  RESP  Clear  P & A; w/o, wheezes/ rales/ or rhonchi.no accessory muscle use, no dullness to percussion  CARD:  RRR, no m/r/g  , no peripheral edema, pulses intact, no cyanosis or clubbing.   GI:   Soft & nt; nml bowel sounds; no organomegaly or masses detected.  Musco: Warm bil, no deformities or joint swelling noted.   Neuro: alert, no focal deficits noted.    Skin: Warm, no lesions or rashes  C       Assessment & Plan:

## 2014-03-30 NOTE — Assessment & Plan Note (Signed)
LLL PNA clinically improving  cxr shows some improvement but not clearance  Will need cxr on return in 2 weeks  If not clearing will need to consider repeat CT in 6 -12 weeks although this appears to c/w acute CAP , this is her second bout of PNA this year with clearance of right sided PNA in March .  Will have her return in 2 weeks with cxr   Plan  Continue on current regimen  Advance activity as tolerated  Fluids and rest  Chest xray on return in 2 weeks  Follow up Dr. Halford Chessman  In 2 weeks and As needed   Please contact office for sooner follow up if symptoms do not improve or worsen or seek emergency care

## 2014-03-30 NOTE — Patient Instructions (Signed)
Continue on current regimen  Advance activity as tolerated  Fluids and rest  Chest xray today  Follow up Dr. Halford Chessman  In 2 weeks and As needed   Please contact office for sooner follow up if symptoms do not improve or worsen or seek emergency care

## 2014-03-30 NOTE — Progress Notes (Signed)
Reviewed and agree with assessment/plan. 

## 2014-05-13 ENCOUNTER — Encounter: Payer: Self-pay | Admitting: Adult Health

## 2014-05-15 ENCOUNTER — Other Ambulatory Visit: Payer: Self-pay | Admitting: *Deleted

## 2014-05-15 ENCOUNTER — Other Ambulatory Visit: Payer: Self-pay

## 2014-05-15 DIAGNOSIS — Z1231 Encounter for screening mammogram for malignant neoplasm of breast: Secondary | ICD-10-CM

## 2014-05-15 MED ORDER — MOMETASONE FURO-FORMOTEROL FUM 200-5 MCG/ACT IN AERO: 2.0000 | INHALATION_SPRAY | Freq: Two times a day (BID) | RESPIRATORY_TRACT | Status: DC

## 2014-05-26 ENCOUNTER — Ambulatory Visit
Admission: RE | Admit: 2014-05-26 | Discharge: 2014-05-26 | Disposition: A | Discharge status: Home / Self Care | Payer: 59 | Source: Ambulatory Visit

## 2014-05-26 ENCOUNTER — Other Ambulatory Visit: Payer: Self-pay

## 2014-05-26 DIAGNOSIS — Z1231 Encounter for screening mammogram for malignant neoplasm of breast: Secondary | ICD-10-CM

## 2014-05-30 ENCOUNTER — Other Ambulatory Visit: Payer: Self-pay | Admitting: Family Medicine

## 2014-05-30 DIAGNOSIS — R928 Other abnormal and inconclusive findings on diagnostic imaging of breast: Secondary | ICD-10-CM

## 2014-06-14 ENCOUNTER — Ambulatory Visit
Admission: RE | Admit: 2014-06-14 | Discharge: 2014-06-14 | Disposition: A | Discharge status: Home / Self Care | Payer: 59 | Source: Ambulatory Visit | Attending: Family Medicine | Admitting: Family Medicine

## 2014-06-14 DIAGNOSIS — R928 Other abnormal and inconclusive findings on diagnostic imaging of breast: Secondary | ICD-10-CM

## 2014-11-16 ENCOUNTER — Other Ambulatory Visit: Payer: Self-pay | Admitting: Family Medicine

## 2014-11-16 DIAGNOSIS — N6320 Unspecified lump in the left breast, unspecified quadrant: Secondary | ICD-10-CM

## 2014-12-14 ENCOUNTER — Ambulatory Visit
Admission: RE | Admit: 2014-12-14 | Discharge: 2014-12-14 | Disposition: A | Discharge status: Home / Self Care | Payer: 59 | Source: Ambulatory Visit | Attending: Family Medicine | Admitting: Family Medicine

## 2014-12-14 DIAGNOSIS — N6320 Unspecified lump in the left breast, unspecified quadrant: Secondary | ICD-10-CM

## 2015-06-05 ENCOUNTER — Other Ambulatory Visit: Payer: Self-pay | Admitting: Family Medicine

## 2015-06-05 DIAGNOSIS — N63 Unspecified lump in unspecified breast: Secondary | ICD-10-CM

## 2015-06-15 ENCOUNTER — Ambulatory Visit
Admission: RE | Admit: 2015-06-15 | Discharge: 2015-06-15 | Disposition: A | Discharge status: Home / Self Care | Payer: 59 | Source: Ambulatory Visit | Attending: Family Medicine | Admitting: Family Medicine

## 2015-06-15 ENCOUNTER — Other Ambulatory Visit: Payer: Self-pay | Admitting: Family Medicine

## 2015-06-15 DIAGNOSIS — N6320 Unspecified lump in the left breast, unspecified quadrant: Secondary | ICD-10-CM

## 2015-06-15 DIAGNOSIS — N63 Unspecified lump in unspecified breast: Secondary | ICD-10-CM

## 2015-06-20 ENCOUNTER — Telehealth: Payer: Self-pay | Admitting: *Deleted

## 2015-06-20 NOTE — Telephone Encounter (Signed)
Left message for a return phone call to schedule for [facility] 06/27/15.

## 2015-06-21 ENCOUNTER — Telehealth: Payer: Self-pay | Admitting: *Deleted

## 2015-06-21 DIAGNOSIS — C50512 Malignant neoplasm of lower-outer quadrant of left female breast: Secondary | ICD-10-CM

## 2015-06-21 DIAGNOSIS — Z170 Estrogen receptor positive status [ER+]: Secondary | ICD-10-CM

## 2015-06-21 NOTE — Telephone Encounter (Signed)
Confirmed [facility] for 06/27/15 at 830am .  Instructions and contact information given.

## 2015-06-27 ENCOUNTER — Encounter: Payer: Self-pay | Admitting: Nurse Practitioner

## 2015-06-27 ENCOUNTER — Ambulatory Visit
Admission: RE | Admit: 2015-06-27 | Discharge: 2015-06-27 | Disposition: A | Discharge status: Home / Self Care | Payer: 59 | Source: Ambulatory Visit | Attending: Radiation Oncology | Admitting: Radiation Oncology

## 2015-06-27 ENCOUNTER — Ambulatory Visit (HOSPITAL_BASED_OUTPATIENT_CLINIC_OR_DEPARTMENT_OTHER): Payer: 59 | Admitting: Oncology

## 2015-06-27 ENCOUNTER — Encounter: Payer: Self-pay | Admitting: *Deleted

## 2015-06-27 ENCOUNTER — Encounter: Payer: Self-pay | Admitting: Physical Therapy

## 2015-06-27 ENCOUNTER — Ambulatory Visit: Payer: 59 | Attending: Surgery | Admitting: Physical Therapy

## 2015-06-27 ENCOUNTER — Other Ambulatory Visit (HOSPITAL_BASED_OUTPATIENT_CLINIC_OR_DEPARTMENT_OTHER): Payer: 59

## 2015-06-27 ENCOUNTER — Encounter: Payer: Self-pay | Admitting: Oncology

## 2015-06-27 ENCOUNTER — Other Ambulatory Visit: Payer: Self-pay | Admitting: Surgery

## 2015-06-27 ENCOUNTER — Encounter: Payer: Self-pay | Admitting: Skilled Nursing Facility1

## 2015-06-27 VITALS — BP 139/67 | HR 89 | Temp 98.0°F | Resp 17 | Ht 63.0 in | Wt 125.5 lb

## 2015-06-27 DIAGNOSIS — C50912 Malignant neoplasm of unspecified site of left female breast: Secondary | ICD-10-CM

## 2015-06-27 DIAGNOSIS — C50512 Malignant neoplasm of lower-outer quadrant of left female breast: Secondary | ICD-10-CM

## 2015-06-27 DIAGNOSIS — Z170 Estrogen receptor positive status [ER+]: Secondary | ICD-10-CM

## 2015-06-27 DIAGNOSIS — Z8371 Family history of colonic polyps: Secondary | ICD-10-CM

## 2015-06-27 DIAGNOSIS — C3411 Malignant neoplasm of upper lobe, right bronchus or lung: Secondary | ICD-10-CM

## 2015-06-27 DIAGNOSIS — R293 Abnormal posture: Secondary | ICD-10-CM | POA: Insufficient documentation

## 2015-06-27 DIAGNOSIS — Z85118 Personal history of other malignant neoplasm of bronchus and lung: Secondary | ICD-10-CM

## 2015-06-27 DIAGNOSIS — Z803 Family history of malignant neoplasm of breast: Secondary | ICD-10-CM

## 2015-06-27 DIAGNOSIS — Z800 Family history of malignant neoplasm of digestive organs: Secondary | ICD-10-CM

## 2015-06-27 DIAGNOSIS — Z801 Family history of malignant neoplasm of trachea, bronchus and lung: Secondary | ICD-10-CM

## 2015-06-27 DIAGNOSIS — J449 Chronic obstructive pulmonary disease, unspecified: Secondary | ICD-10-CM | POA: Diagnosis not present

## 2015-06-27 DIAGNOSIS — Z806 Family history of leukemia: Secondary | ICD-10-CM

## 2015-06-27 LAB — CBC WITH DIFFERENTIAL/PLATELET
BASO%: 0.3 % (ref 0.0–2.0)
BASOS ABS: 0 10*3/uL (ref 0.0–0.1)
EOS ABS: 0.1 10*3/uL (ref 0.0–0.5)
EOS%: 1.1 % (ref 0.0–7.0)
HCT: 43.1 % (ref 34.8–46.6)
HEMOGLOBIN: 13.9 g/dL (ref 11.6–15.9)
LYMPH%: 18.3 % (ref 14.0–49.7)
MCH: 28.4 pg (ref 25.1–34.0)
MCHC: 32.3 g/dL (ref 31.5–36.0)
MCV: 87.7 fL (ref 79.5–101.0)
MONO#: 1.2 10*3/uL — AB (ref 0.1–0.9)
MONO%: 12.7 % (ref 0.0–14.0)
NEUT%: 67.6 % (ref 38.4–76.8)
NEUTROS ABS: 6.6 10*3/uL — AB (ref 1.5–6.5)
PLATELETS: 365 10*3/uL (ref 145–400)
RBC: 4.91 10*6/uL (ref 3.70–5.45)
RDW: 13.2 % (ref 11.2–14.5)
WBC: 9.8 10*3/uL (ref 3.9–10.3)
lymph#: 1.8 10*3/uL (ref 0.9–3.3)

## 2015-06-27 LAB — COMPREHENSIVE METABOLIC PANEL
ALBUMIN: 3.7 g/dL (ref 3.5–5.0)
ALK PHOS: 81 U/L (ref 40–150)
ALT: 19 U/L (ref 0–55)
ANION GAP: 9 meq/L (ref 3–11)
AST: 23 U/L (ref 5–34)
BILIRUBIN TOTAL: 0.33 mg/dL (ref 0.20–1.20)
BUN: 15.2 mg/dL (ref 7.0–26.0)
CO2: 26 mEq/L (ref 22–29)
Calcium: 9.7 mg/dL (ref 8.4–10.4)
Chloride: 104 mEq/L (ref 98–109)
Creatinine: 0.8 mg/dL (ref 0.6–1.1)
EGFR: 73 mL/min/{1.73_m2} — AB (ref >90)
Glucose: 83 mg/dl (ref 70–140)
POTASSIUM: 4.6 meq/L (ref 3.5–5.1)
Sodium: 139 mEq/L (ref 136–145)
TOTAL PROTEIN: 7.3 g/dL (ref 6.4–8.3)

## 2015-06-27 NOTE — Progress Notes (Signed)
Subjective:     Patient ID: Jessica Gilbert, female   DOB: 14-Feb-1946, 70 y.o.   MRN: [id number]  HPI   Review of Systems     Objective:   Physical Exam For the patient to understand and be given the tools to implement a healthy plant based diet during their cancer diagnosis.     Assessment:     Patient was seen today and found to be in good spirits and accompanied by her sister in law. Pts ht 5'3', wt 125 pounds and BMI 22.3. Pt states she has lost about 20 pounds intentionally because she was not comfortable at 148 pounds. Pt states she did this but cutting sweets. Pt states she will often eat a protein bar when she gets hungry. Pt states she does have celiacs but has not had any bouts lately. Pts sister in law told her she only lost 5-6 pounds and pt states her clothes hide her wt well and when asked why she hides her wt she states because it is noone else's business. Pt states she will not try to lose any more weight. Pt states she has a low energy level and eats 3 meals and 1 snack.     Plan:     Dietitian educated the patient on implementing a plant based diet by incorporating more plant proteins, fruits, and vegetables. As a part of a healthy routine physical activity was discussed. Dietitian educated the pt on the importance of eating and not losing any more weight.  The importance of legitimate, evidence based information was discussed and examples were given. A folder of evidence based information with a focus on a plant based diet and general nutrition during cancer was given to the patient.  As a part of the continuum of care the cancer dietitian's contact information was given to the patient in the event they would like to have a follow up appointment.

## 2015-06-27 NOTE — Patient Instructions (Signed)

## 2015-06-27 NOTE — Progress Notes (Signed)
Jessica Gilbert is a very pleasant 70 y.o. 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  Biopsy results revealed the tumor's prognostic profile is ER positive, PR positive, and HER2/neu negative. Ki67 is 11%.  She presents today with her sister in law to the [facility]) for treatment consideration and recommendations from the breast surgeon, radiation oncologist, and medical oncologist.     I briefly met with Jessica Gilbert and her sister in law during her [facility] visit today. We discussed the purpose of the Survivorship Clinic, which will include monitoring for recurrence, coordinating completion of age and gender-appropriate cancer screenings, promotion of overall wellness, as well as managing potential late/long-term side effects of anti-cancer treatments.    The treatment plan for Jessica Gilbert will likely include surgery, radiation therapy, and anti-estrogen therapy.  As of today, the intent of treatment for Jessica Gilbert is cure, therefore she will be eligible for the Survivorship Clinic upon her completion of treatment.  Her survivorship care plan (SCP) document will be drafted and updated throughout the course of her treatment trajectory. She will receive the SCP in an office visit with myself in the Survivorship Clinic once she has completed treatment.   Jessica Gilbert was encouraged to ask questions and all questions were answered to her satisfaction.  She was given my business card and encouraged to contact me with any concerns regarding survivorship.  I look forward to participating in her care.   Kenn File, [place] [phone]

## 2015-06-27 NOTE — Progress Notes (Signed)
Radiation Oncology         ([phone]) [phone] ________________________________  Initial outpatient Consultation  Name: Jessica Gilbert MRN: [id number]  Date: 06/27/2015  DOB: [birth year]  HX:[id number] Marigene Ehlers, MD  [name], MD   REFERRING PHYSICIAN: [name], MD  DIAGNOSIS:    ICD-9-CM ICD-10-CM   1. Breast cancer of lower-outer quadrant of left female breast ([place]) 174.5 C50.512    Stage I Left Breast LOQ Invasive Ductal Carcinoma with DCIS, ER+ / PR+ / Her2neg, Grade 1  HISTORY OF PRESENT ILLNESS::Jessica Gilbert is a 70 y.o. female with past history of lung cancer who presented in December on mammography with a left breast mass, 8-67m in size.  On [place] mass was 714min the 4:00 region of the left breast with an adjacent 54m23module.  Biopsy of the larger mass on 06-15-15 showed Grade I invasive ductal carcinoma and DCIS with characteristics as described above in the diagnosis.  Lung cancer was treated with ChRT and then surgery in 2005. I reviewed her initial PET scan that showed a 3.6 cm RUL lesion. This was far from her current breast malignancy.    Smoking - quit in 2005.  She reports some SOB with exertion, and has COPD Reports history of blood clots.  PREVIOUS RADIATION THERAPY: Yes - for right lung cancer as above.  I am trying to retrieve records.  PAST MEDICAL HISTORY:  has a past medical history of Lung cancer ([place]); Acute respiratory failure ([facility] disease; Human metapneumovirus pneumonia (March 2015); Emphysema lung ([facility]; [facility] ([facility]; Carotid stenosis, right; and Breast cancer ([facility] [place]   PAST SURGICAL HISTORY: Past Surgical History  Procedure Laterality Date  . Lung removal, partial Right 2005    Upper lobe  . Trigger finger release    . Tubal ligation  1980    FAMILY HISTORY: family history includes Breast cancer in her sister; Colon polyps in her mother and sister; Congestive Heart Failure in her father; Diabetes  in her brother; Emphysema in her sister; Heart disease in her brother and father; High blood pressure in her brother, father, and sister; Leukemia in her mother; Lung cancer in her brother; Pancreatic cancer in her paternal uncle.  SOCIAL HISTORY:  reports that she quit smoking about 12 years ago. Her smoking use included Cigarettes. She has a 35 pack-year smoking history. She has never used smokeless tobacco. She reports that she drinks alcohol. She reports that she does not use illicit drugs.  ALLERGIES: Aspirin and Codeine  MEDICATIONS:  Current Outpatient Prescriptions  Medication Sig Dispense Refill  . albuterol (PROVENTIL HFA;VENTOLIN HFA) 108 (90 BASE) MCG/ACT inhaler Inhale 2 puffs into the lungs every 6 (six) hours as needed for wheezing or shortness of breath. 1 Inhaler 3  . albuterol (PROVENTIL) (2.5 MG/3ML) 0.083% nebulizer solution Take 3 mLs (2.5 mg total) by nebulization every 2 (two) hours as needed for wheezing or shortness of breath. 75 mL 12  . ibuprofen (ADVIL,MOTRIN) 200 MG tablet Take 200 mg by mouth every 6 (six) hours as needed.    . iMarland Kitchenratropium (ATROVENT) 0.02 % nebulizer solution Take 2.5 mLs (0.5 mg total) by nebulization every 6 (six) hours as needed for wheezing or shortness of breath. 75 mL 12  . loratadine-pseudoephedrine (CLARITIN-D 12-HOUR) 5-120 MG per tablet Take 1 tablet by mouth as needed.     . mometasone-formoterol (DULERA) 200-5 MCG/ACT AERO Inhale 2 puffs into the lungs 2 (two) times daily. 1 Inhaler 5  .  Multiple Vitamin (MULTIVITAMIN WITH MINERALS) TABS tablet Take 1 tablet by mouth daily.    Marland Kitchen telmisartan (MICARDIS) 40 MG tablet Take 40 mg by mouth daily.     No current facility-administered medications for this encounter.    REVIEW OF SYSTEMS:  Notable for that above.   Full review of systems from intake sheet reviewed.  PHYSICAL EXAM:   Vitals with Age-Percentiles 06/27/2015  Length 696 cm  Systolic 295  Diastolic 67  MAP   Pulse 89    Respiration 17  Weight 56.926 kg  BMI 22.3  VISIT REPORT    General: Alert and oriented, in no acute distress HEENT: Head is normocephalic. Extraocular movements are grossly intact. Oropharynx is clear. Neck: Neck is supple, no palpable cervical or supraclavicular lymphadenopathy. Heart: Regular in rate and rhythm with no murmurs, rubs, or gallops. Chest: Clear to auscultation bilaterally, with no rhonchi, wheezes, or rales. Abdomen: Soft, nontender, nondistended, with no rigidity or guarding. Extremities: No cyanosis or edema. Lymphatics: see Neck Exam Skin: No concerning lesions. Scar over right upper back from previous lung surgery Musculoskeletal: symmetric strength and muscle tone throughout. Neurologic: Cranial nerves II through XII are grossly intact. No obvious focalities. Speech is fluent. Coordination is intact. Psychiatric: Judgment and insight are intact. Affect is appropriate. Breasts: left breast - 1cm LOQ mass with post biopsy ecchymosis.  No axillary masses, nor any other breast masses appreciated    ECOG = 0  0 - Asymptomatic (Fully active, able to carry on all predisease activities without restriction)  1 - Symptomatic but completely ambulatory (Restricted in physically strenuous activity but ambulatory and able to carry out work of a light or sedentary nature. For example, light housework, office work)  2 - Symptomatic, <50% in bed during the day (Ambulatory and capable of all self care but unable to carry out any work activities. Up and about more than 50% of waking hours)  3 - Symptomatic, >50% in bed, but not bedbound (Capable of only limited self-care, confined to bed or chair 50% or more of waking hours)  4 - Bedbound (Completely disabled. Cannot carry on any self-care. Totally confined to bed or chair)  5 - Death   Eustace Pen MM, Creech RH, Tormey DC, et al. [phone]). "Toxicity and response criteria of the [facility]". [place] Oncol.  5 (6): 649-55   LABORATORY DATA:  Lab Results  Component Value Date   WBC 9.8 06/27/2015   HGB 13.9 06/27/2015   HCT 43.1 06/27/2015   MCV 87.7 06/27/2015   PLT 365 06/27/2015   CMP     Component Value Date/Time   NA 139 06/27/2015 0831   NA 141 08/30/2013 0345   K 4.6 06/27/2015 0831   K 4.1 08/30/2013 0345   CL 101 08/30/2013 0345   CO2 26 06/27/2015 0831   CO2 28 08/30/2013 0345   GLUCOSE 83 06/27/2015 0831   GLUCOSE 101* 08/30/2013 0345   BUN 15.2 06/27/2015 0831   BUN 12 08/30/2013 0345   CREATININE 0.8 06/27/2015 0831   CREATININE 0.60 09/04/2013 0347   CALCIUM 9.7 06/27/2015 0831   CALCIUM 8.5 08/30/2013 0345   PROT 7.3 06/27/2015 0831   PROT 7.3 08/28/2013 0937   ALBUMIN 3.7 06/27/2015 0831   ALBUMIN 3.6 08/28/2013 0937   AST 23 06/27/2015 0831   AST 41* 08/28/2013 0937   ALT 19 06/27/2015 0831   ALT 28 08/28/2013 0937   ALKPHOS 81 06/27/2015 0831   ALKPHOS 102 08/28/2013  0937   BILITOT 0.33 06/27/2015 0831   BILITOT 0.4 08/28/2013 0937   GFRNONAA >90 09/04/2013 0347   GFRAA >90 09/04/2013 0347         RADIOGRAPHY: Mm Digital Diagnostic Unilat L  06/15/2015  CLINICAL DATA:  Post ultrasound-guided biopsy of a highly suspicious mass in the left breast at the 4 o'clock position. EXAM: DIAGNOSTIC LEFT MAMMOGRAM POST ULTRASOUND BIOPSY COMPARISON:  Previous exam(s). FINDINGS: Mammographic images were obtained following ultrasound guided biopsy of a highly suspicious mass in the left breast at the 4 o'clock position. A ribbon shaped biopsy marking clip is present in the targeted region of the biopsied left breast mass. IMPRESSION: Appropriate ribbon shaped biopsy marking clip position post ultrasound-guided biopsy of a highly suspicious mass at the 4 o'clock position. Final Assessment: Post Procedure Mammograms for Marker Placement Electronically Signed   By: Everlean Alstrom M.D.   On: 06/15/2015 15:22   US Breast Ltd Uni Left Inc Axilla  06/15/2015  CLINICAL  DATA:  Follow-up for a probably benign left breast mass. EXAM: DIGITAL DIAGNOSTIC BILATERAL MAMMOGRAM WITH 3D TOMOSYNTHESIS AND CAD LEFT BREAST ULTRASOUND COMPARISON:  Previous exam(s). ACR Breast Density Category c: The breast tissue is heterogeneously dense, which may obscure small masses. FINDINGS: No suspicious masses or calcifications are seen in the right breast. There is an irregular mass with spiculated margins within the outer slightly lower left breast measuring approximately 8-9 mm. Mammographic images were processed with CAD. Physical examination of the lower outer left breast reveals a firm fixed nodule at the approximate 330 to 4 o'clock position. Targeted ultrasound of the left breast was performed demonstrating an irregular hypoechoic mass at 4 o'clock 3 cm from the nipple measuring 0.7 x 0.5 x 0.7 cm. This corresponds with mammography findings. A 2 mm adjacent hypoechoic nodule within 5 mm of the dominant mass likely represents a small satellite nodule. No lymphadenopathy seen in the left axilla. IMPRESSION: Highly suspicious 7 mm mass in the left breast at the 4 o'clock position with likely an adjacent 2 mm satellite nodule. RECOMMENDATION: Ultrasound-guided biopsy of the mass in the left breast is recommended. This will subsequently be performed and dictated separately. I have discussed the findings and recommendations with the patient. Results were also provided in writing at the conclusion of the visit. If applicable, a reminder letter will be sent to the patient regarding the next appointment. BI-RADS CATEGORY  5: Highly suggestive of malignancy. Electronically Signed   By: Everlean Alstrom M.D.   On: 06/15/2015 14:25   Mm Diag Breast Tomo Bilateral  06/15/2015  CLINICAL DATA:  Follow-up for a probably benign left breast mass. EXAM: DIGITAL DIAGNOSTIC BILATERAL MAMMOGRAM WITH 3D TOMOSYNTHESIS AND CAD LEFT BREAST ULTRASOUND COMPARISON:  Previous exam(s). ACR Breast Density Category c: The  breast tissue is heterogeneously dense, which may obscure small masses. FINDINGS: No suspicious masses or calcifications are seen in the right breast. There is an irregular mass with spiculated margins within the outer slightly lower left breast measuring approximately 8-9 mm. Mammographic images were processed with CAD. Physical examination of the lower outer left breast reveals a firm fixed nodule at the approximate 330 to 4 o'clock position. Targeted ultrasound of the left breast was performed demonstrating an irregular hypoechoic mass at 4 o'clock 3 cm from the nipple measuring 0.7 x 0.5 x 0.7 cm. This corresponds with mammography findings. A 2 mm adjacent hypoechoic nodule within 5 mm of the dominant mass likely represents a small satellite nodule. No lymphadenopathy  seen in the left axilla. IMPRESSION: Highly suspicious 7 mm mass in the left breast at the 4 o'clock position with likely an adjacent 2 mm satellite nodule. RECOMMENDATION: Ultrasound-guided biopsy of the mass in the left breast is recommended. This will subsequently be performed and dictated separately. I have discussed the findings and recommendations with the patient. Results were also provided in writing at the conclusion of the visit. If applicable, a reminder letter will be sent to the patient regarding the next appointment. BI-RADS CATEGORY  5: Highly suggestive of malignancy. Electronically Signed   By: Everlean Alstrom M.D.   On: 06/15/2015 14:25   [place] Lt Breast Bx W Loc Dev 1st Lesion Img Bx Spec US Guide  06/21/2015  ADDENDUM REPORT: 06/20/2015 13:24 ADDENDUM: Pathology reveals Grade I INVASIVE DUCTAL CARCINOMA WITH ASSOCIATED CALCIFICATIONS, DUCTAL CARCINOMA IN SITU WITH COMEDONECROSIS of the Left breast at the 4 o'clock location. This was found to be concordant by Dr. Everlean Alstrom. Pathology results were discussed with the patient via telephone. She reported tenderness at the biopsy site and is doing well otherwise. Post biopsy  care and instructions were reviewed and her questions were answered. She was encouraged to contact [facility] with any additional questions and or concerns. She was referred to [facility] on June 27, 2015. Pathology results reported by Terie Purser RN on June 20, 2015. Electronically Signed   By: Everlean Alstrom M.D.   On: 06/20/2015 13:24  06/21/2015  CLINICAL DATA:  70 year old female with a highly suspicious mass in the left breast at the 4 o'clock position. EXAM: ULTRASOUND GUIDED LEFT BREAST CORE NEEDLE BIOPSY COMPARISON:  Previous exam(s). FINDINGS: I met with the patient and we discussed the procedure of ultrasound-guided biopsy, including benefits and alternatives. We discussed the high likelihood of a successful procedure. We discussed the risks of the procedure, including infection, bleeding, tissue injury, clip migration, and inadequate sampling. Informed written consent was given. The usual time-out protocol was performed immediately prior to the procedure. Using sterile technique and 2% Lidocaine as local anesthetic, under direct ultrasound visualization, a 12 gauge spring-loaded device was used to perform biopsy of the mass in the left breast at the 4 o'clock position using a lateral to medial approach. At the conclusion of the procedure a ribbon shaped tissue marker clip was deployed into the biopsy cavity. Follow up 2 view mammogram was performed and dictated separately. IMPRESSION: Ultrasound guided biopsy of the highly suspicious mass in the left breast at 4 o'clock. No apparent complications. Electronically Signed: By: Everlean Alstrom M.D. On: 06/15/2015 15:20      IMPRESSION/PLAN: Stage I left breast cancer  She has been discussed at our multidisciplinary tumor board.  The consensus is that she would be a good candidate for breast conservation. I talked to her about the option of  a mastectomy and informed her that her expected overall survival would be equivalent between mastectomy and breast conservation, based upon randomized controlled data. She is enthusiastic about breast conservation.  I have asked dosimetry to pull up her prior RT records, if retrievable, from her right upper lung cancer, for review.  I doubt that this malignancy is related to her prior radiotherapy.  Oncotype testing to follow surgery.  It was a pleasure meeting the patient today. We discussed the risks, benefits, and side effects of radiotherapy. I recommend adjuvant radiotherapy to the left breast to reduce her risk of  locoregional recurrence by 2/3.  We discussed that radiation would take approximately 3-6 weeks to complete and that I would give the patient a few weeks to heal following surgery before starting treatment planning.  If chemotherapy were to be given, this would precede radiotherapy. We spoke about acute effects including skin irritation and fatigue as well as much less common late effects including internal organ injury or irritation. We spoke about the latest technology that is used to minimize the risk of late effects for patients undergoing radiotherapy to the breast or chest wall. No guarantees of treatment were given. The patient is enthusiastic about proceeding with treatment. I look forward to participating in the patient's care.   __________________________________________   [name], MD

## 2015-06-27 NOTE — Therapy (Signed)
[place], [place], [address] Phone: [phone]   Fax:  [phone]  Physical Therapy Evaluation  Patient Details  Name: Jessica Gilbert MRN: [id number] Date of Birth: 1945/09/30 Referring Provider: Dr. [name]  Encounter Date: 06/27/2015      PT End of Session - 06/27/15 1245    Visit Number 1   Number of Visits 1   PT Start Time 7482   PT Stop Time 1121   PT Time Calculation (min) 27 min   Activity Tolerance Patient tolerated treatment well   Behavior During Therapy [facility] for tasks assessed/performed      Past Medical History  Diagnosis Date  . Lung cancer ([place]) 2005    Rt upper lobe  . Acute respiratory failure ([place])   . Celiac disease   . Human metapneumovirus pneumonia March 2015  . Emphysema lung ([place])   . Hypertension   . Stroke ([place])   . Hyperlipidemia   . Carotid stenosis, right   . Breast cancer [facility])     Past Surgical History  Procedure Laterality Date  . Lung removal, partial Right 2005    Upper lobe  . Trigger finger release    . Tubal ligation  1980    There were no vitals filed for this visit.  Visit Diagnosis:  Carcinoma of lower outer quadrant of left breast ([place]) - Plan: PT plan of care cert/re-cert  Abnormal posture - Plan: PT plan of care cert/re-cert      Subjective Assessment - 06/27/15 1238    Subjective Patient was seen today for a baseline assessment of her newly diagnosed left breast cancer.   Patient is accompained by: Family member   Pertinent History Patient was diagnosed on 06/15/15 with left grade 1 invasive ductal carcinoma with DCIS and cals measuring 7 mm.  It is ER/PR positive, HER2 negative with a Ki67 of 11%.  She also has a 2 mm satellite area that has not yet been biopsied.   Patient Stated Goals reduce lymphedema risk and learn post op shoulder ROM HEP   Currently in Pain? No/denies            [facility] PT Assessment - 06/27/15 0001     Assessment   Medical Diagnosis Left breast cancer   Referring Provider Dr. [name]   Onset Date/Surgical Date 06/15/15   Hand Dominance Right   Prior Therapy none   Precautions   Precautions Other (comment)   Precaution Comments active breast cancer; COPD   Restrictions   Weight Bearing Restrictions No   Balance Screen   Has the patient fallen in the past 6 months No   Has the patient had a decrease in activity level because of a fear of falling?  No   Is the patient reluctant to leave their home because of a fear of falling?  No   Home Environment   Living Environment Private residence   Living Arrangements Alone   Available Help at Discharge Family   Prior Function   Level of Independence Independent   Vocation Full time employment   Psychologist, forensic   Leisure She walks 10-15 min/day limited by COPD   Cognition   Overall Cognitive Status Within Functional Limits for tasks assessed   Posture/Postural Control   Posture/Postural Control Postural limitations   Postural Limitations Rounded Shoulders;Forward head   ROM / Strength   AROM / PROM / Strength AROM;Strength   AROM   AROM Assessment Site  Shoulder   Right/Left Shoulder Right;Left   Right Shoulder Extension 53 Degrees   Right Shoulder Flexion 132 Degrees   Right Shoulder ABduction 139 Degrees   Right Shoulder Internal Rotation 75 Degrees   Right Shoulder External Rotation 80 Degrees   Left Shoulder Extension 61 Degrees   Left Shoulder Flexion 138 Degrees   Left Shoulder ABduction 131 Degrees   Left Shoulder Internal Rotation 87 Degrees   Left Shoulder External Rotation 80 Degrees   Strength   Overall Strength Within functional limits for tasks performed           LYMPHEDEMA/ONCOLOGY QUESTIONNAIRE - 06/27/15 1243    Type   Cancer Type Left breast cancer   Lymphedema Assessments   Lymphedema Assessments Upper extremities   Right Upper Extremity Lymphedema   10 cm Proximal to  Olecranon Process 25.4 cm   Olecranon Process 22.5 cm   10 cm Proximal to Ulnar Styloid Process 19.5 cm   Just Proximal to Ulnar Styloid Process 14.6 cm   Across Hand at [employer] 18 cm   At [place] of 2nd Digit 6.3 cm   Left Upper Extremity Lymphedema   10 cm Proximal to Olecranon Process 25.2 cm   Olecranon Process 22.1 cm   10 cm Proximal to Ulnar Styloid Process 19.1 cm   Just Proximal to Ulnar Styloid Process 14.6 cm   Across Hand at [employer] 17.6 cm   At [place] of 2nd Digit 6 cm      Patient was instructed today in a home exercise program today for post op shoulder range of motion. These included active assist shoulder flexion in sitting, scapular retraction, wall walking with shoulder abduction, and hands behind head external rotation.  She was encouraged to do these twice a day, holding 3 seconds and repeating 5 times when permitted by her physician.        PT Education - 06/27/15 1245    Education provided Yes   Education Details Lymphedema risk reduction and post op shoulder ROM HEP   Person(s) Educated Patient   Methods Explanation;Demonstration;Handout   Comprehension Verbalized understanding;Returned demonstration              Breast Clinic Goals - 06/27/15 1254    Patient will be able to verbalize understanding of pertinent lymphedema risk reduction practices relevant to her diagnosis specifically related to skin care.   Time 1   Period Days   Status Achieved   Patient will be able to return demonstrate and/or verbalize understanding of the post-op home exercise program related to regaining shoulder range of motion.   Time 1   Period Days   Status Achieved   Patient will be able to verbalize understanding of the importance of attending the postoperative After Breast Cancer Class for further lymphedema risk reduction education and therapeutic exercise.   Time 1   Period Days   Status Achieved              Plan - 06/27/15 1246    Clinical  Impression Statement Patient was diagnosed on 06/15/15 with left grade 1 invasive ductal carcinoma with DCIS and cals measuring 7 mm.  It is ER/PR positive, HER2 negative with a Ki67 of 11%.  She also has a 2 mm satellite area that has not yet been biopsied.  She is planning to have a left lumpectomy and sentinel node biopsy followed by Oncotype testing, radiation and anti-estrogen therapy.  She may benefit from post op PT to regain  shoulder ROM and strength.  She has has a history of lung cancer in 2005 and COPD which limits her walking and she may benefit from therapy to improve her tolerance of exercise which would ultimately reduce recurrence risk.   Pt will benefit from skilled therapeutic intervention in order to improve on the following deficits Decreased strength;Increased edema;Pain;Decreased knowledge of precautions;Impaired UE functional use;Decreased range of motion   Rehab Potential Excellent   Clinical Impairments Affecting Rehab Potential none   PT Frequency One time visit   PT Treatment/Interventions Therapeutic exercise;Patient/family education   PT Next Visit Plan f/u after surgery   Consulted and Agree with Plan of Care Patient;Family member/caregiver   Family Member Consulted sister-in-law     Patient will follow up at outpatient cancer rehab if needed following surgery.  If the patient requires physical therapy at that time, a specific plan will be dictated and sent to the referring physician for approval. The patient was educated today on appropriate basic range of motion exercises to begin post operatively and the importance of attending the After Breast Cancer class following surgery.  Patient was educated today on lymphedema risk reduction practices as it pertains to recommendations that will benefit the patient immediately following surgery.  She verbalized good understanding.  No additional physical therapy is indicated at this time.       Problem List Patient Active  Problem List   Diagnosis Date Noted  . Primary cancer of right upper lobe of lung ([place]) 06/27/2015  . Breast cancer of lower-outer quadrant of left female breast ([place]) 06/21/2015  . PNA (pneumonia) 02/27/2014  . COPD with emphysema ([place]) 10/10/2013  . HTN (hypertension) 08/28/2013    Annia Friendly, PT 06/27/2015 12:58 PM   [place], [place], [address] Phone: [phone]   Fax:  [phone]  Name: Jessica Gilbert MRN: [id number] Date of Birth: Aug 15, 1945

## 2015-06-27 NOTE — Progress Notes (Signed)
[name]  Telephone:([phone]) [phone] Fax:(336) [phone]     ID: Jessica Gilbert DOB: 1945/08/18  MR#: [id number]  PYP#:[id number]  Patient Care Team: [name], MD as PCP - General (Family Medicine) [name], MD as Consulting Physician (General Surgery) Chauncey Cruel, MD as Consulting Physician (Oncology) [name], MD as Attending Physician (Radiation Oncology) PCP: Jessica Pac, MD GYN: OTHER MD:  CHIEF COMPLAINT:  Estrogen receptor positive breast cancer  CURRENT TREATMENT:  Awaiting definitive surgery   BREAST CANCER HISTORY:  Jessica Gilbert (pronounced "ro-lund") had bilateral screening mammography at the breast Center 05/26/2014 showing a possible mass in the left breast. Left digital mammography with ultrasonography 06/14/2014 found a breast density to be category C. There was no persistent mass or distortion or malignant-type microcalcifications. . There was no palpable mass. Ultrasound showed a well circumscribed benign appearing nodule in the left breast 2:00 position measuring 4 mm. This was felt to be most likely benign but repeat ultrasound  At 6 months was recommended and thiswas performed 12/14/2014.  The mass was unchanged an incidental note was made of a benign appearing 0.5 cm lymph node in the left breast 2:00 position.   On 06/15/2015 Jessica Gilbert had bilateral diagnostic mammography with tomosynthesis and this time an irregular mass with spiculated margins was noted in the outer lower left breast measuring approximately 9 mm. Physical examination confirmed a firm fixed nodule at the 3:30 o'clock position an ultrasound of this area confirmed an irregular hypoechoic mass measuring 0.7 cm. There was a 0.2 cm adjacent satellite nodule. There was no left axillary lymphadenopathy.   biopsy of this mass was obtained 06/15/2015, and showed ([id number]) an invasive ductal carcinoma, grade 1, estrogen receptor 100% positive, progesterone receptor 40%  positive, both with strong staining intensity, with an MIB-1 of 11%, and no HER-2 amplification, the signals ratio being 1.39 and the number per cell 2.15.   Jessica Gilbert also has a history of prior tobacco abuse, status post right upper lobectomy for lung cancer, with significant emphysema.   The patient's subsequent history is as detailed below  INTERVAL HISTORY:  Jessica Gilbert was evaluated in the multidisciplinary breast cancer clinic 06/27/2015 accompanied by her sister-in-law .Her case was also presented in the multidisciplinary breast cancer conference that same morning. At that time a preliminary plan was proposed:  Breast conserving surgery with sentinel lymph node sampling, with Oncotype, followed by radiation and then antiestrogen  REVIEW OF SYSTEMS: There were no specific symptoms leading to the original mammogram, which was routinely scheduled. The patient denies unusual headaches, visual changes, nausea, vomiting, stiff neck, dizziness, or gait imbalance. There has been no cough, phlegm production, or pleurisy, but the patient does have a history of COPD and has been admitted with COPD exacerbations twice, most recently March 2015.  Denies chest pain or pressure, and no change in bowel or bladder habits. The patient denies fever, rash, bleeding, unexplained fatigue or unexplained weight loss.  She had a remote transient ischemic attack affecting her left lower leg, with transient foot drop. That has completely resolved. A detailed review of systems was otherwise entirely negative.  PAST MEDICAL HISTORY: Past Medical History  Diagnosis Date  . Lung cancer ([place]) 2005    Rt upper lobe  . Acute respiratory failure ([place])   . Celiac disease   . Human metapneumovirus pneumonia March 2015  . Emphysema lung ([place])   . Hypertension   . Stroke ([place])   . Hyperlipidemia   . Carotid stenosis, right   .  Breast cancer ([place])     PAST SURGICAL HISTORY: Past Surgical History  Procedure Laterality Date  .  Lung removal, partial Right 2005    Upper lobe  . Trigger finger release    . Tubal ligation  1980    FAMILY HISTORY Family History  Problem Relation Age of Onset  . Heart disease Father   . High blood pressure Father   . Congestive Heart Failure Father   . Leukemia Mother   . Colon polyps Mother   . High blood pressure Brother   . Lung cancer Brother   . Heart disease Brother   . Diabetes Brother   . Colon polyps Sister   . Breast cancer Sister   . High blood pressure Sister   . Emphysema Sister   . Pancreatic cancer Paternal Uncle    the patient's father died at the age of 69 with congestive 40 failure. Patient's mother died with acute leukemia at the age of 8. The patient had one brother, 2 sisters. One sister had breast cancer at age 51. One brother, smoker, was diagnosed with lung cancer at the age of 56. There is a paternal uncle who may have had pancreatic cancer. There is no history of ovarian cancer in the family.  GYNECOLOGIC HISTORY:  No LMP recorded. Patient is postmenopausal.  menarche age 81, first live birth age 37. The patient is GX P2. She stopped having periods in 1998, took hormone replacement approximately 6 months. 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 At home she lives by herself, with 3 cats. Her son Jessica Gilbert  Lives in [place] and works as a Engineer, structural. The patient's other son died 42 years ago. Jessica Gilbert has 2 grandchildren. She is a Tourist information centre manager.    ADVANCED DIRECTIVES:  In place. Her son Jessica Gilbert is her healthcare power of attorney. He can be reached at [place]: Social History  Substance Use Topics  . Smoking status: Former Smoker -- 1.00 packs/day for 35 years    Types: Cigarettes    Quit date: 06/17/2003  . Smokeless tobacco: Never Used  . Alcohol Use: Yes     Colonoscopy:  PAP:  Bone  density:  Lipid panel:  Allergies  Allergen Reactions  . Aspirin Other (See Comments)    "burning stomach"  . Codeine Nausea Only    Current Outpatient Prescriptions  Medication Sig Dispense Refill  . albuterol (PROVENTIL HFA;VENTOLIN HFA) 108 (90 BASE) MCG/ACT inhaler Inhale 2 puffs into the lungs every 6 (six) hours as needed for wheezing or shortness of breath. 1 Inhaler 3  . albuterol (PROVENTIL) (2.5 MG/3ML) 0.083% nebulizer solution Take 3 mLs (2.5 mg total) by nebulization every 2 (two) hours as needed for wheezing or shortness of breath. 75 mL 12  . ibuprofen (ADVIL,MOTRIN) 200 MG tablet Take 200 mg by mouth every 6 (six) hours as needed.    Marland Kitchen ipratropium (ATROVENT) 0.02 % nebulizer solution Take 2.5 mLs (0.5 mg total) by nebulization every 6 (six) hours as needed for wheezing or shortness of breath. 75 mL 12  . mometasone-formoterol (DULERA) 200-5 MCG/ACT AERO Inhale 2 puffs into the lungs 2 (two) times daily. 1 Inhaler 5  . Multiple Vitamin (MULTIVITAMIN WITH MINERALS) TABS tablet Take 1 tablet by mouth daily.    Marland Kitchen telmisartan (MICARDIS) 40 MG tablet Take 40 mg by mouth daily.    Marland Kitchen  loratadine-pseudoephedrine (CLARITIN-D 12-HOUR) 5-120 MG per tablet Take 1 tablet by mouth as needed.      No current facility-administered medications for this visit.    OBJECTIVE:  Middle-aged white woman in no acute distress Filed Vitals:   06/27/15 0845  BP: 139/67  Pulse: 89  Temp: 98 F (36.7 C)  Resp: 17     Body mass index is 22.24 kg/(m^2).    ECOG FS:1 - Symptomatic but completely ambulatory  Ocular: Sclerae unicteric, pupils equal, round and reactive to light Ear-nose-throat: Oropharynx clear and moist Lymphatic: No cervical or supraclavicular adenopathy Lungs no rales or rhonchi, good excursion bilaterally Heart regular rate and rhythm, no murmur appreciated Abd soft, nontender, positive bowel sounds MSK no focal spinal tenderness, no joint edema Neuro: non-focal,  well-oriented, appropriate affect Breasts:  The right breast is unremarkable. In the left breast lower outer quadrant there is an easily palpable movable mass measuring approximately 1 cm. There is no associated erythema. There is no nipple or skin change of concern. The left axilla is benign.   LAB RESULTS:  CMP     Component Value Date/Time   NA 139 06/27/2015 0831   NA 141 08/30/2013 0345   K 4.6 06/27/2015 0831   K 4.1 08/30/2013 0345   CL 101 08/30/2013 0345   CO2 26 06/27/2015 0831   CO2 28 08/30/2013 0345   GLUCOSE 83 06/27/2015 0831   GLUCOSE 101* 08/30/2013 0345   BUN 15.2 06/27/2015 0831   BUN 12 08/30/2013 0345   CREATININE 0.8 06/27/2015 0831   CREATININE 0.60 09/04/2013 0347   CALCIUM 9.7 06/27/2015 0831   CALCIUM 8.5 08/30/2013 0345   PROT 7.3 06/27/2015 0831   PROT 7.3 08/28/2013 0937   ALBUMIN 3.7 06/27/2015 0831   ALBUMIN 3.6 08/28/2013 0937   AST 23 06/27/2015 0831   AST 41* 08/28/2013 0937   ALT 19 06/27/2015 0831   ALT 28 08/28/2013 0937   ALKPHOS 81 06/27/2015 0831   ALKPHOS 102 08/28/2013 0937   BILITOT 0.33 06/27/2015 0831   BILITOT 0.4 08/28/2013 0937   GFRNONAA >90 09/04/2013 0347   GFRAA >90 09/04/2013 0347    INo results found for: SPEP, UPEP  Lab Results  Component Value Date   WBC 9.8 06/27/2015   NEUTROABS 6.6* 06/27/2015   HGB 13.9 06/27/2015   HCT 43.1 06/27/2015   MCV 87.7 06/27/2015   PLT 365 06/27/2015      Chemistry      Component Value Date/Time   NA 139 06/27/2015 0831   NA 141 08/30/2013 0345   K 4.6 06/27/2015 0831   K 4.1 08/30/2013 0345   CL 101 08/30/2013 0345   CO2 26 06/27/2015 0831   CO2 28 08/30/2013 0345   BUN 15.2 06/27/2015 0831   BUN 12 08/30/2013 0345   CREATININE 0.8 06/27/2015 0831   CREATININE 0.60 09/04/2013 0347      Component Value Date/Time   CALCIUM 9.7 06/27/2015 0831   CALCIUM 8.5 08/30/2013 0345   ALKPHOS 81 06/27/2015 0831   ALKPHOS 102 08/28/2013 0937   AST 23 06/27/2015 0831    AST 41* 08/28/2013 0937   ALT 19 06/27/2015 0831   ALT 28 08/28/2013 0937   BILITOT 0.33 06/27/2015 0831   BILITOT 0.4 08/28/2013 0937       No results found for: LABCA2  No components found for: [id number]  No results for input(s): INR in the last 168 hours.  Urinalysis No results found for: COLORURINE, APPEARANCEUR, [place],  PHURINE, GLUCOSEU, HGBUR, BILIRUBINUR, KETONESUR, PROTEINUR, UROBILINOGEN, NITRITE, LEUKOCYTESUR  STUDIES: Mm Digital Diagnostic Unilat L  06/15/2015  CLINICAL DATA:  Post ultrasound-guided biopsy of a highly suspicious mass in the left breast at the 4 o'clock position. EXAM: DIAGNOSTIC LEFT MAMMOGRAM POST ULTRASOUND BIOPSY COMPARISON:  Previous exam(s). FINDINGS: Mammographic images were obtained following ultrasound guided biopsy of a highly suspicious mass in the left breast at the 4 o'clock position. A ribbon shaped biopsy marking clip is present in the targeted region of the biopsied left breast mass. IMPRESSION: Appropriate ribbon shaped biopsy marking clip position post ultrasound-guided biopsy of a highly suspicious mass at the 4 o'clock position. Final Assessment: Post Procedure Mammograms for Marker Placement Electronically Signed   By: Everlean Alstrom M.D.   On: 06/15/2015 15:22   US Breast Ltd Uni Left Inc Axilla  06/15/2015  CLINICAL DATA:  Follow-up for a probably benign left breast mass. EXAM: DIGITAL DIAGNOSTIC BILATERAL MAMMOGRAM WITH 3D TOMOSYNTHESIS AND CAD LEFT BREAST ULTRASOUND COMPARISON:  Previous exam(s). ACR Breast Density Category c: The breast tissue is heterogeneously dense, which may obscure small masses. FINDINGS: No suspicious masses or calcifications are seen in the right breast. There is an irregular mass with spiculated margins within the outer slightly lower left breast measuring approximately 8-9 mm. Mammographic images were processed with CAD. Physical examination of the lower outer left breast reveals a firm fixed nodule at the  approximate 330 to 4 o'clock position. Targeted ultrasound of the left breast was performed demonstrating an irregular hypoechoic mass at 4 o'clock 3 cm from the nipple measuring 0.7 x 0.5 x 0.7 cm. This corresponds with mammography findings. A 2 mm adjacent hypoechoic nodule within 5 mm of the dominant mass likely represents a small satellite nodule. No lymphadenopathy seen in the left axilla. IMPRESSION: Highly suspicious 7 mm mass in the left breast at the 4 o'clock position with likely an adjacent 2 mm satellite nodule. RECOMMENDATION: Ultrasound-guided biopsy of the mass in the left breast is recommended. This will subsequently be performed and dictated separately. I have discussed the findings and recommendations with the patient. Results were also provided in writing at the conclusion of the visit. If applicable, a reminder letter will be sent to the patient regarding the next appointment. BI-RADS CATEGORY  5: Highly suggestive of malignancy. Electronically Signed   By: Everlean Alstrom M.D.   On: 06/15/2015 14:25   Mm Diag Breast Tomo Bilateral  06/15/2015  CLINICAL DATA:  Follow-up for a probably benign left breast mass. EXAM: DIGITAL DIAGNOSTIC BILATERAL MAMMOGRAM WITH 3D TOMOSYNTHESIS AND CAD LEFT BREAST ULTRASOUND COMPARISON:  Previous exam(s). ACR Breast Density Category c: The breast tissue is heterogeneously dense, which may obscure small masses. FINDINGS: No suspicious masses or calcifications are seen in the right breast. There is an irregular mass with spiculated margins within the outer slightly lower left breast measuring approximately 8-9 mm. Mammographic images were processed with CAD. Physical examination of the lower outer left breast reveals a firm fixed nodule at the approximate 330 to 4 o'clock position. Targeted ultrasound of the left breast was performed demonstrating an irregular hypoechoic mass at 4 o'clock 3 cm from the nipple measuring 0.7 x 0.5 x 0.7 cm. This corresponds with  mammography findings. A 2 mm adjacent hypoechoic nodule within 5 mm of the dominant mass likely represents a small satellite nodule. No lymphadenopathy seen in the left axilla. IMPRESSION: Highly suspicious 7 mm mass in the left breast at the 4 o'clock position with likely an  adjacent 2 mm satellite nodule. RECOMMENDATION: Ultrasound-guided biopsy of the mass in the left breast is recommended. This will subsequently be performed and dictated separately. I have discussed the findings and recommendations with the patient. Results were also provided in writing at the conclusion of the visit. If applicable, a reminder letter will be sent to the patient regarding the next appointment. BI-RADS CATEGORY  5: Highly suggestive of malignancy. Electronically Signed   By: Everlean Alstrom M.D.   On: 06/15/2015 14:25   [place] Lt Breast Bx W Loc Dev 1st Lesion Img Bx Spec US Guide  06/21/2015  ADDENDUM REPORT: 06/20/2015 13:24 ADDENDUM: Pathology reveals Grade I INVASIVE DUCTAL CARCINOMA WITH ASSOCIATED CALCIFICATIONS, DUCTAL CARCINOMA IN SITU WITH COMEDONECROSIS of the Left breast at the 4 o'clock location. This was found to be concordant by Dr. Everlean Alstrom. Pathology results were discussed with the patient via telephone. She reported tenderness at the biopsy site and is doing well otherwise. Post biopsy care and instructions were reviewed and her questions were answered. She was encouraged to contact [facility] with any additional questions and or concerns. She was referred to [facility] on June 27, 2015. Pathology results reported by Terie Purser RN on June 20, 2015. Electronically Signed   By: Everlean Alstrom M.D.   On: 06/20/2015 13:24  06/21/2015  CLINICAL DATA:  70 year old female with a highly suspicious mass in the left breast at the 4 o'clock position. EXAM: ULTRASOUND GUIDED LEFT BREAST CORE NEEDLE BIOPSY  COMPARISON:  Previous exam(s). FINDINGS: I met with the patient and we discussed the procedure of ultrasound-guided biopsy, including benefits and alternatives. We discussed the high likelihood of a successful procedure. We discussed the risks of the procedure, including infection, bleeding, tissue injury, clip migration, and inadequate sampling. Informed written consent was given. The usual time-out protocol was performed immediately prior to the procedure. Using sterile technique and 2% Lidocaine as local anesthetic, under direct ultrasound visualization, a 12 gauge spring-loaded device was used to perform biopsy of the mass in the left breast at the 4 o'clock position using a lateral to medial approach. At the conclusion of the procedure a ribbon shaped tissue marker clip was deployed into the biopsy cavity. Follow up 2 view mammogram was performed and dictated separately. IMPRESSION: Ultrasound guided biopsy of the highly suspicious mass in the left breast at 4 o'clock. No apparent complications. Electronically Signed: By: Everlean Alstrom M.D. On: 06/15/2015 15:20    ASSESSMENT: 70 y.o.  [place] woman status post left breast biopsy 06/15/2015 for a clinical mT1c N0, stage IA  Invasive ductal carcinoma, grade 1, estrogen and progesterone receptor positive, HER-2 not amplified, with an MIB-1 of 11%   (1) breast conserving surgery with sentinel lymph node sampling planned  (2) Oncotype DX will be requested from the definitive  Surgical specimen  (3)  Adjuvant radiation to follow    (4)  Antiestrogen therapy to follow local treatment.  PLAN: We spent the better part of today's hour-long appointment discussing the biology of breast cancer in general, and the specifics of the patient's tumor in particular.  Zanaya understands first of all that this cancer is not related to her prior lung cancer. We discussed the fact that cancer is really 120 different diseases and that it is important to keep them  separate. Otherwise it gets very confusing in terms of planning.   We emphasized the fact that she has  a small , slow-growing, not aggressive looking invasive ductal carcinoma. She understands "invasive" only means of the cancer cells cut out of the duct where they started, not that it is invading "all over her body".   We then discussed the difference between local and systemic therapy for breast cancer. She is planning to undergo breast conserving surgery with sentinel lymph node sampling followed by radiation, which is the standard of care.   As far as systemic therapy is concerned, she clearly will benefit from anti-estrogens, and she clearly will not benefit from anti-HER-2 therapy.  The question of chemotherapy is more complex. Chemotherapy works most efficiently in rapidly growing aggressive cancers and that is not what we are dealing with here. Accordingly I would anticipate that any benefit she might receive from adjuvant chemotherapy would be marginal.  To quantitate that better we are going to send an Oncotype from her final surgical specimen. My anticipation is that this will fall in the low or possibly intermediate risk category. In that case she would not proceed to chemotherapy. She understands if we are surprised and the tumor does fall in the high-risk group then she would need chemotherapy which in that case would consist of cyclophosphamide and docetaxel 4   Jessica Gilbert has a good understanding of the overall plan. She agrees with it. She knows the goal of treatment in her case is cure. She also knows her chance of cure is good. She will call with any problems that may develop before her next visit here.  Chauncey Cruel, MD   06/27/2015 11:13 AM Medical Oncology and Hematology [facility] [address] [place], [place] [address] Tel. [phone]    Fax. [phone]

## 2015-06-27 NOTE — Progress Notes (Signed)
Clinical Social Work [place] Psychosocial Distress Screening [place]  Patient completed distress screening protocol and scored a 8 on the Psychosocial Distress Thermometer which indicates moderate distress. Clinical Social Worker met with patient and patients family in [facility] to assess for distress and other psychosocial needs. Patient stated she was feeling overwhelmed but felt "better" after meeting with the treatment team and getting more information on her treatment plan. CSW and patient discussed common feeling and emotions when being diagnosed with cancer, and the importance of support during treatment. CSW informed patient of the support team and support services at [facility]. CSW provided contact information and encouraged patient to call with any questions or concerns.   ONCBCN DISTRESS SCREENING 06/27/2015  Screening Type Initial Screening  Distress experienced in past week (1-10) 8  Practical problem type Work/school  Emotional problem type Nervousness/Anxiety  Information Concerns Type Lack of info about treatment  Physician notified of physical symptoms Yes  Referral to clinical psychology No  Referral to clinical social work Yes  Referral to dietition No  Referral to financial advocate No  Referral to support programs No  Referral to palliative care No   Johnnye Lana, MSW, LCSW, OSW-C Clinical Social Worker [place] [phone]

## 2015-07-01 ENCOUNTER — Telehealth: Payer: Self-pay | Admitting: Oncology

## 2015-07-01 NOTE — Telephone Encounter (Signed)
Left a message with 2/17 appointment

## 2015-07-02 ENCOUNTER — Telehealth: Payer: Self-pay | Admitting: *Deleted

## 2015-07-02 NOTE — Telephone Encounter (Signed)
Left vm for pt to return call regarding [place] from 06/27/15. Contact information provided.

## 2015-07-04 ENCOUNTER — Other Ambulatory Visit: Payer: Self-pay | Admitting: Surgery

## 2015-07-04 DIAGNOSIS — C50912 Malignant neoplasm of unspecified site of left female breast: Secondary | ICD-10-CM

## 2015-07-11 ENCOUNTER — Encounter (HOSPITAL_BASED_OUTPATIENT_CLINIC_OR_DEPARTMENT_OTHER): Payer: Self-pay | Admitting: *Deleted

## 2015-07-11 NOTE — Progress Notes (Signed)
Pt's PMH, meds, & labs reviewed with Dr. Al Corpus. [place] for breast surgery 07/17/15.

## 2015-07-12 ENCOUNTER — Ambulatory Visit
Admission: RE | Admit: 2015-07-12 | Discharge: 2015-07-12 | Disposition: A | Discharge status: Home / Self Care | Payer: 59 | Source: Ambulatory Visit | Attending: Surgery | Admitting: Surgery

## 2015-07-12 ENCOUNTER — Encounter (HOSPITAL_BASED_OUTPATIENT_CLINIC_OR_DEPARTMENT_OTHER)
Admission: RE | Admit: 2015-07-12 | Discharge: 2015-07-12 | Disposition: A | Discharge status: Home / Self Care | Payer: 59 | Source: Ambulatory Visit | Attending: Surgery | Admitting: Surgery

## 2015-07-12 DIAGNOSIS — Z01810 Encounter for preprocedural cardiovascular examination: Secondary | ICD-10-CM | POA: Diagnosis present

## 2015-07-12 DIAGNOSIS — I10 Essential (primary) hypertension: Secondary | ICD-10-CM | POA: Diagnosis not present

## 2015-07-12 DIAGNOSIS — C50912 Malignant neoplasm of unspecified site of left female breast: Secondary | ICD-10-CM

## 2015-07-16 ENCOUNTER — Other Ambulatory Visit: Payer: Self-pay | Admitting: Oncology

## 2015-07-16 NOTE — H&P (Signed)
Jessica Gilbert. [facility]  Location: [facility] Patient #: [id number] DOB: 30-Dec-1945 Undefined / Language: Cleophus Molt / Race: White Female   History of Present Illness   The patient is a 70 year old female who presents with breast cancer.   Her PCP is Dr. Lawernce Pitts  She is at the [facility] [place] Drs. Magrinat and Isidore Moos  She comes with her sister in law - Darlin Coco   The patient had a mass in her left breast that was being followed about every 6 months. So her recent mammograms were for a six-month follow-up of this mass.  she underwent a mammogram on 06/15/2015 that showed a 0.7 cm mass in the 4:00 position of the left breast. She underwent a biopsy of the left breast on 06/15/2015 [phone]) which showed an IDC, ER - 100%, PR - 40%, and Ki67 -11%, Her2Neu - neg.  I discussed the options for breast cancer treatment with the patient. She is in the [facility] [place], which includes medical oncology and radiation oncology. I discussed the surgical options of lumpectomy vs. mastectomy. If mastectomy, there is the possibility of reconstruction. I discussed the options of lymph node biopsy. The treatment plan depends on the pathologic staging of the tumor and the patient's personal wishes. The risks of surgery include, but are not limited to, bleeding, infection, the need for further surgery, and nerve injury. The patient has been given literature on the treatment of breast cancer.  Plan: 1) left breast lumpectomy (seed loc) and left axillary sentinel lymph node biopsy, 2) oncotype  Past Medical History: 1. Right lung cancer - resected and treated with chemo and rad tx - Burney, Truddie Coco, and Kinard She is disease free 2. Has COPD Has been seen by pulmonary medicine - but was not impressed with them. 3. HTN 4. Last colonoscopy about 7 years ago 5. Celiac disease - saw Dr. Amedeo Plenty.  Social History Divorced. Lives by self. Works as  Optometrist for [employer]. Has one son in [place] She comes with her sister in law - Darlin Coco   Medication History Tawni Pummel, RN; 06/27/2015 8:27 AM) Medications Reconciled  Physical Exam  General: Older thin WN WF alert and generally healthy appearing. HEENT: Normal. Pupils equal.  Neck: Supple. No mass. No thyroid mass. Lymph Nodes: No supraclavicular or cervical or axillary nodes.  Lungs: Clear to auscultation and symmetric breath sounds. Heart: RRR. No murmur or rub.  Breasts: right - no mass  Left - bruise at 4 o'clock with an approx 1.5 cm mass. This is probably a hematoma.  Abdomen: Soft. No mass. No tenderness. No hernia. Normal bowel sounds.  Extremities: Good strength and ROM in upper and lower extremities.  Neurologic: Grossly intact to motor and sensory function. Psychiatric: Has normal mood and affect. Behavior is normal.  Assessment & Plan  1.  BREAST CANCER, STAGE 1, LEFT (C50.912)  Story: Biopsy of the left breast on 06/15/2015 ([id number]) which showed an IDC,, ER - 100%, PR - 40%, and Ki67 -11%, Her2Neu - neg.   Oncology - Magrinat/Squire   1) left breast lumpectomy (seed loc) and left axillary sentinel lymph node biopsy,  2) oncotype  2.  CELIAC DISEASE (K90.0)  Impression: Was seen by Dr. Amedeo Plenty  3.  Right lung cancer - resected and treated with chemo and rad tx - Burney, Truddie Coco, and Kinard  She is disease free 4. Has COPD  Has been seen by pulmonary medicine - but was not impressed with  them. 5. HTN  [name], MD, [facility] Pager: [phone] Office phone:  [phone]

## 2015-07-16 NOTE — Anesthesia Preprocedure Evaluation (Addendum)
Anesthesia Evaluation  Patient identified by MRN, date of birth, ID band Patient awake    Reviewed: Allergy & Precautions, NPO status , Patient's Chart, lab work & pertinent test results  Airway Mallampati: II  TM Distance: >3 FB Neck ROM: Full    Dental  (+) Dental Advisory Given   Pulmonary pneumonia, COPD, former smoker,    breath sounds clear to auscultation       Cardiovascular hypertension, Pt. on medications + Peripheral Vascular Disease   Rhythm:Regular Rate:Normal     Neuro/Psych CVA negative psych ROS   GI/Hepatic Neg liver ROS, hiatal hernia,   Endo/Other  negative endocrine ROS  Renal/GU negative Renal ROS  negative genitourinary   Musculoskeletal negative musculoskeletal ROS (+)   Abdominal   Peds negative pediatric ROS (+)  Hematology negative hematology ROS (+)   Anesthesia Other Findings   Reproductive/Obstetrics negative OB ROS                            Lab Results  Component Value Date   WBC 9.8 06/27/2015   HGB 13.9 06/27/2015   HCT 43.1 06/27/2015   MCV 87.7 06/27/2015   PLT 365 06/27/2015   Lab Results  Component Value Date   CREATININE 0.8 06/27/2015   BUN 15.2 06/27/2015   NA 139 06/27/2015   K 4.6 06/27/2015   CL 101 08/30/2013   CO2 26 06/27/2015     Anesthesia Physical Anesthesia Plan  ASA: III  Anesthesia Plan: General and Regional   Post-op Pain Management: GA combined w/ Regional for post-op pain   Induction: Intravenous  Airway Management Planned: LMA  Additional Equipment:   Intra-op Plan:   Post-operative Plan: Extubation in OR  Informed Consent: I have reviewed the patients History and Physical, chart, labs and discussed the procedure including the risks, benefits and alternatives for the proposed anesthesia with the patient or authorized representative who has indicated his/her understanding and acceptance.   Dental advisory  given  Plan Discussed with: CRNA  Anesthesia Plan Comments:         Anesthesia Quick Evaluation

## 2015-07-17 ENCOUNTER — Ambulatory Visit (HOSPITAL_BASED_OUTPATIENT_CLINIC_OR_DEPARTMENT_OTHER): Payer: 59 | Admitting: Anesthesiology

## 2015-07-17 ENCOUNTER — Ambulatory Visit
Admission: RE | Admit: 2015-07-17 | Discharge: 2015-07-17 | Disposition: A | Discharge status: Home / Self Care | Payer: 59 | Source: Ambulatory Visit | Attending: Surgery | Admitting: Surgery

## 2015-07-17 ENCOUNTER — Encounter (HOSPITAL_BASED_OUTPATIENT_CLINIC_OR_DEPARTMENT_OTHER)
Admission: RE | Disposition: A | Discharge status: Home / Self Care | Payer: Self-pay | Source: Ambulatory Visit | Attending: Surgery

## 2015-07-17 ENCOUNTER — Ambulatory Visit (HOSPITAL_BASED_OUTPATIENT_CLINIC_OR_DEPARTMENT_OTHER)
Admission: RE | Admit: 2015-07-17 | Discharge: 2015-07-17 | Disposition: A | Discharge status: Home / Self Care | Payer: 59 | Source: Ambulatory Visit | Attending: Surgery | Admitting: Surgery

## 2015-07-17 ENCOUNTER — Encounter (HOSPITAL_BASED_OUTPATIENT_CLINIC_OR_DEPARTMENT_OTHER): Payer: Self-pay | Admitting: *Deleted

## 2015-07-17 ENCOUNTER — Encounter (HOSPITAL_COMMUNITY)
Admission: RE | Admit: 2015-07-17 | Discharge: 2015-07-17 | Disposition: A | Discharge status: Home / Self Care | Payer: 59 | Source: Ambulatory Visit | Attending: Surgery | Admitting: Surgery

## 2015-07-17 DIAGNOSIS — Z171 Estrogen receptor negative status [ER-]: Secondary | ICD-10-CM | POA: Diagnosis not present

## 2015-07-17 DIAGNOSIS — K900 Celiac disease: Secondary | ICD-10-CM | POA: Insufficient documentation

## 2015-07-17 DIAGNOSIS — Z85118 Personal history of other malignant neoplasm of bronchus and lung: Secondary | ICD-10-CM | POA: Diagnosis not present

## 2015-07-17 DIAGNOSIS — I10 Essential (primary) hypertension: Secondary | ICD-10-CM | POA: Diagnosis not present

## 2015-07-17 DIAGNOSIS — C50912 Malignant neoplasm of unspecified site of left female breast: Secondary | ICD-10-CM

## 2015-07-17 DIAGNOSIS — C50512 Malignant neoplasm of lower-outer quadrant of left female breast: Secondary | ICD-10-CM | POA: Diagnosis not present

## 2015-07-17 DIAGNOSIS — J449 Chronic obstructive pulmonary disease, unspecified: Secondary | ICD-10-CM | POA: Diagnosis not present

## 2015-07-17 HISTORY — DX: Personal history of other diseases of the digestive system: Z87.19

## 2015-07-17 HISTORY — PX: BREAST LUMPECTOMY WITH RADIOACTIVE SEED AND SENTINEL LYMPH NODE BIOPSY: SHX6550

## 2015-07-17 SURGERY — BREAST LUMPECTOMY WITH RADIOACTIVE SEED AND SENTINEL LYMPH NODE BIOPSY
Anesthesia: Regional | Site: Breast | Laterality: Left

## 2015-07-17 MED ORDER — TRAMADOL HCL 50 MG PO TABS: 50.0000 mg | ORAL_TABLET | Freq: Four times a day (QID) | ORAL | Status: DC | PRN

## 2015-07-17 MED ORDER — LACTATED RINGERS IV SOLN
INTRAVENOUS | Status: DC
  Administered 2015-07-17 (×3): via INTRAVENOUS

## 2015-07-17 MED ORDER — FENTANYL CITRATE (PF) 100 MCG/2ML IJ SOLN
50.0000 ug | INTRAMUSCULAR | Status: DC | PRN
  Administered 2015-07-17: 50 ug via INTRAVENOUS

## 2015-07-17 MED ORDER — LACTATED RINGERS IV SOLN: INTRAVENOUS | Status: DC

## 2015-07-17 MED ORDER — PROPOFOL 10 MG/ML IV BOLUS
INTRAVENOUS | Status: AC
  Filled 2015-07-17: qty 40

## 2015-07-17 MED ORDER — SCOPOLAMINE 1 MG/3DAYS TD PT72: 1.0000 | MEDICATED_PATCH | Freq: Once | TRANSDERMAL | Status: DC | PRN

## 2015-07-17 MED ORDER — FENTANYL CITRATE (PF) 100 MCG/2ML IJ SOLN
INTRAMUSCULAR | Status: DC | PRN
  Administered 2015-07-17: 100 ug via INTRAVENOUS
  Administered 2015-07-17: 50 ug via INTRAVENOUS

## 2015-07-17 MED ORDER — MIDAZOLAM HCL 5 MG/5ML IJ SOLN
INTRAMUSCULAR | Status: DC | PRN
  Administered 2015-07-17: 2 mg via INTRAVENOUS

## 2015-07-17 MED ORDER — PHENYLEPHRINE HCL 10 MG/ML IJ SOLN
INTRAMUSCULAR | Status: AC
  Filled 2015-07-17: qty 1

## 2015-07-17 MED ORDER — TECHNETIUM TC 99M SULFUR COLLOID FILTERED
1.0000 | Freq: Once | INTRAVENOUS | Status: AC | PRN
  Administered 2015-07-17: 1 via INTRADERMAL

## 2015-07-17 MED ORDER — BUPIVACAINE-EPINEPHRINE (PF) 0.5% -1:200000 IJ SOLN
INTRAMUSCULAR | Status: DC | PRN
  Administered 2015-07-17: 20 mL

## 2015-07-17 MED ORDER — KETOROLAC TROMETHAMINE 30 MG/ML IJ SOLN: INTRAMUSCULAR | Status: DC | PRN

## 2015-07-17 MED ORDER — KETOROLAC TROMETHAMINE 30 MG/ML IJ SOLN
INTRAMUSCULAR | Status: AC
  Filled 2015-07-17: qty 1

## 2015-07-17 MED ORDER — MIDAZOLAM HCL 2 MG/2ML IJ SOLN
INTRAMUSCULAR | Status: AC
  Filled 2015-07-17: qty 2

## 2015-07-17 MED ORDER — HYDROMORPHONE HCL 1 MG/ML IJ SOLN
INTRAMUSCULAR | Status: AC
  Filled 2015-07-17: qty 1

## 2015-07-17 MED ORDER — CEFAZOLIN SODIUM-DEXTROSE 2-3 GM-% IV SOLR
2.0000 g | INTRAVENOUS | Status: AC
  Administered 2015-07-17: 2 g via INTRAVENOUS

## 2015-07-17 MED ORDER — PHENYLEPHRINE HCL 10 MG/ML IJ SOLN
INTRAMUSCULAR | Status: DC | PRN
  Administered 2015-07-17 (×2): 80 ug via INTRAVENOUS

## 2015-07-17 MED ORDER — BUPIVACAINE-EPINEPHRINE (PF) 0.5% -1:200000 IJ SOLN
INTRAMUSCULAR | Status: AC
  Filled 2015-07-17: qty 30

## 2015-07-17 MED ORDER — CHLORHEXIDINE GLUCONATE 4 % EX LIQD: 1.0000 "application " | Freq: Once | CUTANEOUS | Status: DC

## 2015-07-17 MED ORDER — ONDANSETRON HCL 4 MG/2ML IJ SOLN
INTRAMUSCULAR | Status: DC | PRN
  Administered 2015-07-17: 4 mg via INTRAVENOUS

## 2015-07-17 MED ORDER — DEXAMETHASONE SODIUM PHOSPHATE 10 MG/ML IJ SOLN
INTRAMUSCULAR | Status: AC
  Filled 2015-07-17: qty 1

## 2015-07-17 MED ORDER — DEXAMETHASONE SODIUM PHOSPHATE 4 MG/ML IJ SOLN
INTRAMUSCULAR | Status: DC | PRN
  Administered 2015-07-17: 10 mg via INTRAVENOUS

## 2015-07-17 MED ORDER — BUPIVACAINE-EPINEPHRINE 0.25% -1:200000 IJ SOLN
INTRAMUSCULAR | Status: DC | PRN
  Administered 2015-07-17: 20 mL

## 2015-07-17 MED ORDER — GLYCOPYRROLATE 0.2 MG/ML IJ SOLN: 0.2000 mg | Freq: Once | INTRAMUSCULAR | Status: DC | PRN

## 2015-07-17 MED ORDER — FENTANYL CITRATE (PF) 100 MCG/2ML IJ SOLN
INTRAMUSCULAR | Status: AC
  Filled 2015-07-17: qty 2

## 2015-07-17 MED ORDER — BUPIVACAINE-EPINEPHRINE (PF) 0.25% -1:200000 IJ SOLN
INTRAMUSCULAR | Status: AC
  Filled 2015-07-17: qty 30

## 2015-07-17 MED ORDER — HYDROMORPHONE HCL 1 MG/ML IJ SOLN
0.2500 mg | INTRAMUSCULAR | Status: DC | PRN
  Administered 2015-07-17: 0.5 mg via INTRAVENOUS

## 2015-07-17 MED ORDER — SODIUM CHLORIDE 0.9 % IJ SOLN
INTRAMUSCULAR | Status: AC
  Filled 2015-07-17: qty 10

## 2015-07-17 MED ORDER — PROPOFOL 10 MG/ML IV BOLUS
INTRAVENOUS | Status: DC | PRN
  Administered 2015-07-17: 150 mg via INTRAVENOUS

## 2015-07-17 MED ORDER — CEFAZOLIN SODIUM-DEXTROSE 2-3 GM-% IV SOLR
INTRAVENOUS | Status: AC
  Filled 2015-07-17: qty 50

## 2015-07-17 MED ORDER — EPHEDRINE SULFATE 50 MG/ML IJ SOLN
INTRAMUSCULAR | Status: AC
  Filled 2015-07-17: qty 1

## 2015-07-17 MED ORDER — PROMETHAZINE HCL 25 MG/ML IJ SOLN: 6.2500 mg | INTRAMUSCULAR | Status: DC | PRN

## 2015-07-17 MED ORDER — ONDANSETRON HCL 4 MG/2ML IJ SOLN
INTRAMUSCULAR | Status: AC
  Filled 2015-07-17: qty 2

## 2015-07-17 MED ORDER — MIDAZOLAM HCL 2 MG/2ML IJ SOLN
1.0000 mg | INTRAMUSCULAR | Status: DC | PRN
  Administered 2015-07-17: 1 mg via INTRAVENOUS

## 2015-07-17 MED ORDER — KETOROLAC TROMETHAMINE 30 MG/ML IJ SOLN
INTRAMUSCULAR | Status: DC | PRN
  Administered 2015-07-17: 30 mg via INTRAVENOUS

## 2015-07-17 MED ORDER — EPHEDRINE SULFATE 50 MG/ML IJ SOLN
INTRAMUSCULAR | Status: DC | PRN
  Administered 2015-07-17: 10 mg via INTRAVENOUS

## 2015-07-17 MED ORDER — LIDOCAINE HCL (CARDIAC) 20 MG/ML IV SOLN
INTRAVENOUS | Status: AC
  Filled 2015-07-17: qty 5

## 2015-07-17 MED ORDER — PROPOFOL 500 MG/50ML IV EMUL
INTRAVENOUS | Status: AC
  Filled 2015-07-17: qty 50

## 2015-07-17 MED ORDER — LIDOCAINE HCL (CARDIAC) 20 MG/ML IV SOLN
INTRAVENOUS | Status: DC | PRN
  Administered 2015-07-17: 50 mg via INTRAVENOUS

## 2015-07-17 SURGICAL SUPPLY — 53 items
BENZOIN TINCTURE PRP APPL 2/3 (GAUZE/BANDAGES/DRESSINGS) IMPLANT
BINDER BREAST LRG (GAUZE/BANDAGES/DRESSINGS) IMPLANT
BINDER BREAST MEDIUM (GAUZE/BANDAGES/DRESSINGS) ×2 IMPLANT
BINDER BREAST XLRG (GAUZE/BANDAGES/DRESSINGS) IMPLANT
BINDER BREAST XXLRG (GAUZE/BANDAGES/DRESSINGS) IMPLANT
BLADE SURG 15 STRL LF DISP TIS (BLADE) ×1 IMPLANT
BLADE SURG 15 STRL SS (BLADE) ×1
CANISTER SUC SOCK COL 7IN (MISCELLANEOUS) IMPLANT
CANISTER SUCT 1200ML W/VALVE (MISCELLANEOUS) ×2 IMPLANT
CHLORAPREP W/TINT 26ML (MISCELLANEOUS) ×2 IMPLANT
CLIP TI WIDE RED SMALL 6 (CLIP) ×2 IMPLANT
COVER BACK TABLE 60X90IN (DRAPES) ×2 IMPLANT
COVER MAYO STAND STRL (DRAPES) ×2 IMPLANT
COVER PROBE W GEL 5X96 (DRAPES) ×2 IMPLANT
DECANTER SPIKE VIAL GLASS SM (MISCELLANEOUS) IMPLANT
DEVICE DUBIN W/COMP PLATE 8390 (MISCELLANEOUS) ×2 IMPLANT
DRAPE LAPAROSCOPIC ABDOMINAL (DRAPES) ×2 IMPLANT
DRAPE UTILITY XL STRL (DRAPES) ×2 IMPLANT
DRSG PAD ABDOMINAL 8X10 ST (GAUZE/BANDAGES/DRESSINGS) IMPLANT
ELECT COATED BLADE 2.86 ST (ELECTRODE) ×2 IMPLANT
ELECT REM PT RETURN 9FT ADLT (ELECTROSURGICAL) ×2
ELECTRODE REM PT RTRN 9FT ADLT (ELECTROSURGICAL) ×1 IMPLANT
GAUZE SPONGE 4X4 12PLY STRL (GAUZE/BANDAGES/DRESSINGS) ×2 IMPLANT
GLOVE BIO SURGEON STRL SZ 6.5 (GLOVE) ×2 IMPLANT
GLOVE BIOGEL PI IND STRL 6.5 (GLOVE) ×1 IMPLANT
GLOVE BIOGEL PI IND STRL 7.0 (GLOVE) ×2 IMPLANT
GLOVE BIOGEL PI INDICATOR 6.5 (GLOVE) ×1
GLOVE BIOGEL PI INDICATOR 7.0 (GLOVE) ×2
GLOVE ECLIPSE 6.5 STRL STRAW (GLOVE) ×2 IMPLANT
GLOVE SURG SIGNA 7.5 PF LTX (GLOVE) ×4 IMPLANT
GOWN STRL REUS W/ TWL LRG LVL3 (GOWN DISPOSABLE) ×2 IMPLANT
GOWN STRL REUS W/ TWL XL LVL3 (GOWN DISPOSABLE) ×1 IMPLANT
GOWN STRL REUS W/TWL LRG LVL3 (GOWN DISPOSABLE) ×2
GOWN STRL REUS W/TWL XL LVL3 (GOWN DISPOSABLE) ×1
KIT MARKER MARGIN INK (KITS) ×2 IMPLANT
LIQUID BAND (GAUZE/BANDAGES/DRESSINGS) ×2 IMPLANT
NDL SAFETY ECLIPSE 18X1.5 (NEEDLE) IMPLANT
NEEDLE HYPO 18GX1.5 SHARP (NEEDLE)
NEEDLE HYPO 25X1 1.5 SAFETY (NEEDLE) ×2 IMPLANT
NS IRRIG 1000ML POUR BTL (IV SOLUTION) ×2 IMPLANT
PACK BASIN DAY SURGERY FS (CUSTOM PROCEDURE TRAY) ×2 IMPLANT
PENCIL BUTTON HOLSTER BLD 10FT (ELECTRODE) ×2 IMPLANT
SHEET MEDIUM DRAPE 40X70 STRL (DRAPES) ×2 IMPLANT
SLEEVE SCD COMPRESS KNEE MED (MISCELLANEOUS) ×2 IMPLANT
SPONGE LAP 18X18 X RAY DECT (DISPOSABLE) ×2 IMPLANT
STRIP CLOSURE SKIN 1/2X4 (GAUZE/BANDAGES/DRESSINGS) IMPLANT
SUT MON AB 5-0 PS2 18 (SUTURE) ×2 IMPLANT
SUT VICRYL 3-0 CR8 SH (SUTURE) ×4 IMPLANT
SYR CONTROL 10ML LL (SYRINGE) ×2 IMPLANT
TOWEL OR 17X24 6PK STRL BLUE (TOWEL DISPOSABLE) ×2 IMPLANT
TOWEL OR NON WOVEN STRL DISP B (DISPOSABLE) IMPLANT
TUBE CONNECTING 20X1/4 (TUBING) ×2 IMPLANT
YANKAUER SUCT BULB TIP NO VENT (SUCTIONS) ×2 IMPLANT

## 2015-07-17 NOTE — Op Note (Signed)
07/17/2015  9:11 AM  PATIENT:  Jessica Gilbert DOB: 02/28/1946 MRN: [id number]  PREOP DIAGNOSIS:  left breast cancer  POSTOP DIAGNOSIS:   Left breast cancer, 4 o'clock position (T1, N0)  PROCEDURE:   Procedure(s):  LEFT BREAST LUMPECTOMY WITH RADIOACTIVE SEED WITH LEFT AXILLARY SENTINEL LYMPH NODE BIOPSY  SURGEON:   [name], M.D.  ANESTHESIA:   general  CRNA: Marrianne Mood, CRNA  General  EBL:  minimal  ml  DRAINS: none   LOCAL MEDICATIONS USED:   15 cc of 1/4% marcaine, pectoralis block  SPECIMEN:   Left breast lumpectomy (suture medial), lateral margin (suture anterior), left axillary sentinel lymph node (counts 700, background 10)  COUNTS CORRECT:  YES  INDICATIONS FOR PROCEDURE:  Jessica Gilbert is a 70 y.o. (DOB: May 19, 1946) white  female whose primary care physician is [name], MD and comes for left breast lumpectomy and left axillary sentinel lymph node biopsy.   She was seen at the breast multidisciplinary clinic with Drs. Magrinat and Isidore Moos.   The options for breast cancer treatment have been discussed with the patient. She elected to proceed with lumpectomy and axillary sentinel lymph node.     The indications and potential complications of surgery were explained to the patient. Potential complications include, but are not limited to, bleeding, infection, the need for further surgery, and nerve injury.     She had a I131 seed placed on 07/12/2015 in her left breast at [facility].  I confirmed the presence of the I131 seed in the pre op area using the Neoprobe.  The seed is in the 4 o'clock position of the left breast.   In the holding area, her left areola was injected with 1 millicurie of Technitium Sulfur Colloid.  OPERATIVE NOTE:   The patient was taken to room # 8 at [facility] where she underwent a general anesthesia  supervised by CRNA: Marrianne Mood, CRNA. Her left breast and axilla were prepped with  ChloraPrep and sterilely  draped.    A time-out and the surgical check list was reviewed.    I turned attention to the cancer which was about at the 4 o'clock position of the left breast.   I used the Neoprobe to identify the I131 seed.  I tried to excise an area around the tumor of at least 1 cm.    I excised this block of breast tissue approximately 3 cm by 4 cm  in diameter.  I went down to the chest wall with the excision.   I painted the lumpectomy specimen with the 6 color paint kit and did a specimen mammogram which confirmed the mass, clip, and the seed were all in the right position in the specimen.  The specimen was sent to pathology who called back to confirm that they have the seed and the specimen.   I then started the left axillary sentinel lymph node biopsy. I made an incision in the left axilla.  I found a hot area at the junction of the breast and the pectoralis major muscle. I cut down and  identified a hot node that had counts of 700 and the background has 10 counts.  I checked her internal mammary nodes and supraclavicular nodes with the neoprobe and found no other hot area. The axillary node was then sent to pathology.    I then irrigated the wound with saline. I infiltrated approximately 15 mL of 1/4% marcaine between the incisions.  I placed  6 clips to mark biopsy cavity, at 12, 3, 6, and 9 o'clock. Two clips were placed on the pectoralis major.   I then closed all the wounds in layers using 3-0 Vicryl sutures for the deep layer. At the skin, I closed the incisions with a 4-0 Monocryl suture. The incisions were then painted with LiquiBand.  She had gauze place over the wounds and placed in a breast binder.   The patient tolerated the procedure well, was transported to the recovery room in good condition. Sponge and needle count were correct at the end of the case.   Final pathology is pending.   [name], MD, [facility] Pager: [phone] Office phone:  [phone]

## 2015-07-17 NOTE — Progress Notes (Signed)
Assisted Dr. Smith Robert with left, ultrasound guided, pectoralis block. Side rails up, monitors on throughout procedure. See vital signs in flow sheet. Tolerated Procedure well.

## 2015-07-17 NOTE — Transfer of Care (Signed)
Immediate Anesthesia Transfer of Care Note  Patient: Jessica Gilbert  Procedure(s) Performed: Procedure(s): LEFT BREAST LUMPECTOMY WITH RADIOACTIVE SEED WITH LEFT AXILLARY SENTINEL LYMPH NODE BIOPSY (Left)  Patient Location: PACU  Anesthesia Type:GA combined with regional for post-op pain  Level of Consciousness: awake and patient cooperative  Airway & Oxygen Therapy: Patient Spontanous Breathing and Patient connected to face mask oxygen  Post-op Assessment: Report given to RN and Post -op Vital signs reviewed and stable  Post vital signs: Reviewed and stable  Last Vitals:  Filed Vitals:   07/17/15 0725 07/17/15 0911  BP:  141/73  Pulse: 79 59  Temp:    Resp: 16 21    Complications: No apparent anesthesia complications

## 2015-07-17 NOTE — Interval H&P Note (Signed)
History and Physical Interval Note:  07/17/2015 7:17 AM  Jessica Gilbert  has presented today for surgery, with the diagnosis of left breast cancer  The various methods of treatment have been discussed with the patient and family.  Her son and a friend are here with her.  The seed appears in the right location.  After consideration of risks, benefits and other options for treatment, the patient has consented to  Procedure(s): LEFT BREAST LUMPECTOMY WITH RADIOACTIVE SEED WITH LEFT AXILLARY SENTINEL LYMPH NODE BIOPSY (Left) as a surgical intervention .  The patient's history has been reviewed, patient examined, no change in status, stable for surgery.  I have reviewed the patient's chart and labs.  Questions were answered to the patient's satisfaction.     Lucillia Corson H

## 2015-07-17 NOTE — Anesthesia Procedure Notes (Addendum)
Anesthesia Regional Block:  Pectoralis block  Pre-Anesthetic Checklist: ,, timeout performed, Correct Patient, Correct Site, Correct Laterality, Correct Procedure, Correct Position, site marked, Risks and benefits discussed,  Surgical consent,  Pre-op evaluation,  At surgeon's request and post-op pain management  Laterality: Left  Prep: chloraprep       Needles:  Injection technique: Single-shot  Needle Type: Echogenic Needle     Needle Length: 9cm 9 cm Needle Gauge: 21 and 21 G    Additional Needles:  Procedures: ultrasound guided (picture in chart) Pectoralis block Narrative:  Start time: 07/17/2015 7:15 AM End time: 07/17/2015 7:18 AM Injection made incrementally with aspirations every 5 mL.  Performed by: Personally  Anesthesiologist: Suella Broad D  Additional Notes: No immediate complications noted.    Procedure Name: LMA Insertion Date/Time: 07/17/2015 7:45 AM Performed by: Toula Moos L Pre-anesthesia Checklist: Patient identified, Emergency Drugs available, Suction available, Patient being monitored and Timeout performed Patient Re-evaluated:Patient Re-evaluated prior to inductionOxygen Delivery Method: Circle System Utilized Preoxygenation: Pre-oxygenation with 100% oxygen Intubation Type: IV induction Ventilation: Mask ventilation without difficulty LMA: LMA inserted LMA Size: 4.0 Number of attempts: 1 Airway Equipment and Method: Bite block Placement Confirmation: positive ETCO2 Tube secured with: Tape Dental Injury: Teeth and Oropharynx as per pre-operative assessment

## 2015-07-17 NOTE — Anesthesia Postprocedure Evaluation (Signed)
Anesthesia Post Note  Patient: Jessica Gilbert  Procedure(s) Performed: Procedure(s) (LRB): LEFT BREAST LUMPECTOMY WITH RADIOACTIVE SEED WITH LEFT AXILLARY SENTINEL LYMPH NODE BIOPSY (Left)  Patient location during evaluation: PACU Anesthesia Type: General and Regional Level of consciousness: awake and alert Pain management: pain level controlled Vital Signs Assessment: post-procedure vital signs reviewed and stable Respiratory status: spontaneous breathing, nonlabored ventilation, respiratory function stable and patient connected to nasal cannula oxygen Cardiovascular status: blood pressure returned to baseline and stable Postop Assessment: no signs of nausea or vomiting Anesthetic complications: no    Last Vitals:  Filed Vitals:   07/17/15 0945 07/17/15 1015  BP: 136/77 131/76  Pulse: 85 84  Temp:  36.8 C  Resp: 14 16    Last Pain:  Filed Vitals:   07/17/15 1125  PainSc: Clayton Roslyn Else

## 2015-07-17 NOTE — Discharge Instructions (Signed)
[facility] [place] [facility] - DISCHARGE INSTRUCTIONS TO PATIENT  Activity:  Driving - may drive in 2 or 3 days, if doing well   Lifting - take it easy for one week, then no limit  Wound Care:   Leave bandage for 2 days, then may remove and shower.  Diet:  As tolerated  Follow up appointment:  Call Dr. Pollie Friar office [facility]) at [phone] for an appointment in 2 weeks  Medications and dosages:  Resume your home medications.  You have a prescription for:  Ultram  Call Dr. Lucia Gaskins or his office  [phone]) if you have:  Temperature greater than 100.4,  Persistent nausea and vomiting,  Severe uncontrolled pain,  Redness, tenderness, or signs of infection (pain, swelling, redness, odor or green/yellow discharge around the site),  Difficulty breathing, headache or visual disturbances,  Any other questions or concerns you may have after discharge.  In an emergency, call 911 or go to an Emergency Department at a nearby hospital.    Post Anesthesia Home Care Instructions  Activity: Get plenty of rest for the remainder of the day. A responsible adult should stay with you for 24 hours following the procedure.  For the next 24 hours, DO NOT: -Drive a car -Paediatric nurse -Drink alcoholic beverages -Take any medication unless instructed by your physician -Make any legal decisions or sign important papers.  Meals: Start with liquid foods such as gelatin or soup. Progress to regular foods as tolerated. Avoid greasy, spicy, heavy foods. If nausea and/or vomiting occur, drink only clear liquids until the nausea and/or vomiting subsides. Call your physician if vomiting continues.  Special Instructions/Symptoms: Your throat may feel dry or sore from the anesthesia or the breathing tube placed in your throat during surgery. If this causes discomfort, gargle with warm salt water. The discomfort should disappear within 24 hours.  If you had a scopolamine patch placed  behind your ear for the management of post- operative nausea and/or vomiting:  1. The medication in the patch is effective for 72 hours, after which it should be removed.  Wrap patch in a tissue and discard in the trash. Wash hands thoroughly with soap and water. 2. You may remove the patch earlier than 72 hours if you experience unpleasant side effects which may include dry mouth, dizziness or visual disturbances. 3. Avoid touching the patch. Wash your hands with soap and water after contact with the patch.    Regional Anesthesia Blocks  1. Numbness or the inability to move the "blocked" extremity may last from 3-48 hours after placement. The length of time depends on the medication injected and your individual response to the medication. If the numbness is not going away after 48 hours, call your surgeon.  2. The extremity that is blocked will need to be protected until the numbness is gone and the  Strength has returned. Because you cannot feel it, you will need to take extra care to avoid injury. Because it may be weak, you may have difficulty moving it or using it. You may not know what position it is in without looking at it while the block is in effect.  3. For blocks in the legs and feet, returning to weight bearing and walking needs to be done carefully. You will need to wait until the numbness is entirely gone and the strength has returned. You should be able to move your leg and foot normally before you try and bear weight or walk. You will need someone to  be with you when you first try to ensure you do not fall and possibly risk injury.  4. Bruising and tenderness at the needle site are common side effects and will resolve in a few days.  5. Persistent numbness or new problems with movement should be communicated to the surgeon or the [facility] [phone] [place] [phone]).

## 2015-07-18 ENCOUNTER — Encounter (HOSPITAL_BASED_OUTPATIENT_CLINIC_OR_DEPARTMENT_OTHER): Payer: Self-pay | Admitting: Surgery

## 2015-07-18 DIAGNOSIS — C50912 Malignant neoplasm of unspecified site of left female breast: Secondary | ICD-10-CM | POA: Diagnosis not present

## 2015-07-19 ENCOUNTER — Telehealth: Payer: Self-pay | Admitting: *Deleted

## 2015-07-19 ENCOUNTER — Other Ambulatory Visit: Payer: Self-pay | Admitting: Oncology

## 2015-07-19 NOTE — Telephone Encounter (Signed)
Received order for oncotype testing per Dr. Jana Hakim. Requisition sent to pathology. Received by Alyse Low.

## 2015-07-20 ENCOUNTER — Other Ambulatory Visit: Payer: Self-pay | Admitting: Oncology

## 2015-08-02 ENCOUNTER — Other Ambulatory Visit: Payer: Self-pay | Admitting: Oncology

## 2015-08-02 ENCOUNTER — Encounter (HOSPITAL_COMMUNITY): Payer: Self-pay

## 2015-08-02 ENCOUNTER — Telehealth: Payer: Self-pay | Admitting: *Deleted

## 2015-08-02 NOTE — Telephone Encounter (Signed)
Received Oncotype score of 16.  Left vm for pt to return call to discuss oncotype results. Referral placed for pt to see Dr. Isidore Moos.

## 2015-08-03 ENCOUNTER — Ambulatory Visit: Payer: 59 | Admitting: Oncology

## 2015-08-07 NOTE — Progress Notes (Signed)
Location of Breast Cancer: Left Breast  Histology per Pathology Report:  07/17/15 Diagnosis 1. Breast, lumpectomy, Left INVASIVE DUCTAL CARCINOMA, GRADE 2 (0.9 CM) CARCINOMA IN SITU WITH COMEDONECROSIS IS PRESENT MARGINS OF RESECTION ARE NEGATIVE FOR TUMOR 2. Breast, excision, Left new lateral margin BENIGN BREAST TISSUE NEGATIVE FOR ATYPIA OR MALIGNANCY 3. Lymph node, sentinel, biopsy, Left axillary #1 ONE BENIGN LYMPH NODE (0/1) 4. Lymph node, sentinel, biopsy, Left axillary #2 ONE BEING LYMPH NODE (0/1) 5. Lymph node, sentinel, biopsy, Left axillary #3 ONE BENIGN LYMPH NODE (0/1) 6. Lymph node, sentinel, biopsy, Left axillary #4 ONE BENIGN LYMPH NODE (0/1)  Receptor Status: ER(90%), PR (60%), Her2-neu (NEG)  Did patient present with symptoms, or was this found on screening mammography?: It was found on a screening mammogram.  Past/Anticipated interventions by surgeon, if any: Dr. [name] performed: LEFT BREAST LUMPECTOMY WITH RADIOACTIVE SEED WITH LEFT AXILLARY [place] on 07/17/15 She has had a follow up with Dr. Lucia Gaskins and he is pleased with her healing.   Past/Anticipated interventions by medical oncology, if any: Oncotype testing revealed a score of 16. She will not receive chemotherapy  Lymphedema issues, if any:  No  Pain issues, if any:  No pain, she reports her surgery site is sore.   SAFETY ISSUES:  Prior radiation? Yes, Right Lung 2005  Pacemaker/ICD? No  Possible current pregnancy? No  Is the patient on methotrexate? No  Current Complaints / other details:   She reports she has healed well from her surgery.     Heliodoro Domagalski, Stephani Police, RN 08/07/2015,10:56 AM

## 2015-08-09 ENCOUNTER — Ambulatory Visit: Payer: 59 | Admitting: Oncology

## 2015-08-14 NOTE — Progress Notes (Signed)
Radiation Oncology         [phone] ________________________________  Name: Jessica Gilbert MRN: [id number]  Date: 08/15/2015  DOB: [birth year]  Follow-Up Visit Note  Outpatient  CC: [name], MD  [name], MD  Diagnosis:      ICD-9-CM ICD-10-CM   1. Breast cancer of lower-outer quadrant of left female breast (HCC) 174.5 C50.512     Stage I [id number] Left Breast LOQ Grade 2 Invasive Ductal Carcinoma with DCIS, ER+ / PR+ / Her2neg  Narrative:  The patient returns today for follow-up.     Since consultation, she underwent lumpectomy and sentinel lymph node biopsy on 07/17/15.  This revealed a 0.9cm tumor with characteristics as above in the diagnosis.   Margins are close but negative.  All 4 SLN were negative.          Oncotype score was 16. She was discussed at tumor board today. Dr Jana Hakim does not recommend chemotherapy.  Although margins are close, the consensus is to proceed with RT and not reexcise tissue (risks of further surgery outweigh benefits).  I have reviewed her prior plans from her lung cancer radiotherapy delivered several years ago.  ALLERGIES:  is allergic to aspirin and codeine.  Meds: Current Outpatient Prescriptions  Medication Sig Dispense Refill  . albuterol (PROVENTIL HFA;VENTOLIN HFA) 108 (90 BASE) MCG/ACT inhaler Inhale 2 puffs into the lungs every 6 (six) hours as needed for wheezing or shortness of breath. 1 Inhaler 3  . diphenhydrAMINE (BENADRYL) 25 MG tablet Take 25 mg by mouth every 8 (eight) hours as needed.    Marland Kitchen ibuprofen (ADVIL,MOTRIN) 200 MG tablet Take 200 mg by mouth every 6 (six) hours as needed.    . mometasone-formoterol (DULERA) 200-5 MCG/ACT AERO Inhale 2 puffs into the lungs 2 (two) times daily. 1 Inhaler 5  . Multiple Vitamin (MULTIVITAMIN WITH MINERALS) TABS tablet Take 1 tablet by mouth daily.    Marland Kitchen telmisartan (MICARDIS) 40 MG tablet Take 40 mg by mouth daily.    . traMADol (ULTRAM) 50 MG tablet Take 1-2 tablets (50-100  mg total) by mouth every 6 (six) hours as needed. (Patient not taking: Reported on 08/15/2015) 30 tablet 1   No current facility-administered medications for this encounter.    Physical Findings:  height is 5' 4"  (1.626 m) and weight is 127 lb 1.6 oz (57.652 kg). Her temperature is 97.9 F (36.6 C). Her blood pressure is 132/67 and her pulse is 86. .     General: Alert and oriented, in no acute distress HEENT: Head is normocephalic.   Neck: Neck is supple, no palpable cervical or supraclavicular lymphadenopathy. Heart: Regular in rate and rhythm with no murmurs, rubs, or gallops. Chest: Clear to auscultation bilaterally, with no rhonchi, wheezes, or rales.  Extremities: No cyanosis or edema in arms. Lymphatics: see Neck Exam  Psychiatric: Judgment and insight are intact. Affect is appropriate. Breast exam reveals well healed lumpectomy and axillary scars on left  Lab Findings: Lab Results  Component Value Date   WBC 9.8 06/27/2015   HGB 13.9 06/27/2015   HCT 43.1 06/27/2015   MCV 87.7 06/27/2015   PLT 365 06/27/2015       Radiographic Findings: Nm Sentinel Node Inj-no Rpt (breast)  07/17/2015  CLINICAL DATA: left breast cancer Sulfur colloid was injected intradermally by the nuclear medicine technologist for breast cancer sentinel node localization.   Mm Breast Surgical Specimen  07/17/2015  CLINICAL DATA:  Specimen radiograph status post left breast lumpectomy. EXAM:  SPECIMEN RADIOGRAPH OF THE LEFT BREAST COMPARISON:  Previous exam(s). FINDINGS: Status post excision of the left breast. The radioactive seed and biopsy marker clip are present, completely intact, and were marked for pathology. These findings were communicated with the OR at 8:20 a.m. IMPRESSION: Specimen radiograph of the left breast. Electronically Signed   By: Ammie Ferrier M.D.   On: 07/17/2015 08:56    Impression/Plan: left breast cancer  We discussed adjuvant radiotherapy today.  I recommend radiotherapy  to the left breast in order to reduce risk of locoregional recurrence by 2/3.  It may also improve her life expectancy.The risks, benefits and side effects of this treatment were discussed in detail.  She understands that radiotherapy is associated with skin irritation and fatigue in the acute setting. Late effects can include cosmetic changes and rare injury to internal organs.   She is enthusiastic about proceeding with treatment. A consent form has been  signed and placed in her chart.   HOWEVER, she is reluctant at this time to proceed with RT due to side effects she recalls from her lung cancer treatments as well as skin irritation experienced by her sister and a friend during breast RT.  We had a lengthy discussion about what is typical for her type of breast cancer radiotherapy, to frame her expectations appropriately.  She understands the risks and benefits of treatment, and that the risks of recurrence would be heightened if she declines RT.  I gave her my contact information to call if she decides to proceed with treatment planning. I will not schedule anything at this time given her reluctance to proceed. She is agreeable with this plan.  Of note, if we proceed with RT, we will take her prior treatment fields for her lung cancer into consideration. I do not think her prior RT is prohibitive.  I spent 30 minutes minutes face to face with the patient and more than 50% of that time was spent in counseling and/or coordination of care. _____________________________________   [name], MD

## 2015-08-15 ENCOUNTER — Ambulatory Visit
Admission: RE | Admit: 2015-08-15 | Discharge: 2015-08-15 | Disposition: A | Discharge status: Home / Self Care | Payer: 59 | Source: Ambulatory Visit | Attending: Radiation Oncology | Admitting: Radiation Oncology

## 2015-08-15 ENCOUNTER — Encounter: Payer: Self-pay | Admitting: Radiation Oncology

## 2015-08-15 VITALS — BP 132/67 | HR 86 | Temp 97.9°F | Ht 64.0 in | Wt 127.1 lb

## 2015-08-15 DIAGNOSIS — C50512 Malignant neoplasm of lower-outer quadrant of left female breast: Secondary | ICD-10-CM | POA: Diagnosis present

## 2015-08-15 DIAGNOSIS — Z170 Estrogen receptor positive status [ER+]: Secondary | ICD-10-CM | POA: Insufficient documentation

## 2015-08-15 NOTE — Addendum Note (Signed)
Encounter addended by: Ernst Spell, RN on: 08/15/2015  3:14 PM<BR>     Documentation filed: Charges VN

## 2015-08-16 ENCOUNTER — Other Ambulatory Visit: Payer: Self-pay | Admitting: Oncology

## 2015-08-16 ENCOUNTER — Telehealth: Payer: Self-pay | Admitting: Oncology

## 2015-08-16 NOTE — Telephone Encounter (Signed)
cld pt and left message of time & date of appt on 3/24 @1 

## 2015-09-07 ENCOUNTER — Telehealth: Payer: Self-pay | Admitting: Oncology

## 2015-09-07 ENCOUNTER — Ambulatory Visit (HOSPITAL_BASED_OUTPATIENT_CLINIC_OR_DEPARTMENT_OTHER): Payer: 59 | Admitting: Oncology

## 2015-09-07 VITALS — BP 156/67 | HR 86 | Temp 97.9°F | Resp 18 | Ht 64.0 in | Wt 127.5 lb

## 2015-09-07 DIAGNOSIS — Z85118 Personal history of other malignant neoplasm of bronchus and lung: Secondary | ICD-10-CM | POA: Diagnosis not present

## 2015-09-07 DIAGNOSIS — J439 Emphysema, unspecified: Secondary | ICD-10-CM

## 2015-09-07 DIAGNOSIS — C50512 Malignant neoplasm of lower-outer quadrant of left female breast: Secondary | ICD-10-CM

## 2015-09-07 MED ORDER — ANASTROZOLE 1 MG PO TABS: 1.0000 mg | ORAL_TABLET | Freq: Every day | ORAL | Status: DC

## 2015-09-07 NOTE — Telephone Encounter (Signed)
appt made and avs printed °

## 2015-09-07 NOTE — Progress Notes (Signed)
[facility]  [phone] Fax:([phone]) [phone]     ID: [name] DOB: [birth year]  MR#: [id number]  MGQ#:[id number]  Patient Care Team: Jessica Fess, MD as PCP - General (Family Medicine) Jessica Overall, MD as Consulting Physician (General Surgery) Jessica Cruel, MD as Consulting Physician (Oncology) Jessica Gibson, MD as Attending Physician (Radiation Oncology) Jessica Cheese, NP as Nurse Practitioner (Hematology and Oncology) PCP: Jessica Pac, MD GYN: OTHER MD:  CHIEF COMPLAINT:  Estrogen receptor positive breast cancer  CURRENT TREATMENT:  anastrozole   BREAST CANCER HISTORY: From the original intake note:   Jessica Gilbert (pronounced "ro-lund") had bilateral screening mammography at the [facility] Center 05/26/2014 showing a possible mass in the left breast. Left digital mammography with ultrasonography 06/14/2014 found a breast density to be category C. There was no persistent mass or distortion or malignant-type microcalcifications. . There was no palpable mass. Ultrasound showed a well circumscribed benign appearing nodule in the left breast 2:00 position measuring 4 mm. This was felt to be most likely benign but repeat ultrasound  At 6 months was recommended and thiswas performed 12/14/2014.  The mass was unchanged an incidental note was made of a benign appearing 0.5 cm lymph node in the left breast 2:00 position.   On 06/15/2015 Jessica Gilbert had bilateral diagnostic mammography with tomosynthesis and this time an irregular mass with spiculated margins was noted in the outer lower left breast measuring approximately 9 mm. Physical examination confirmed a firm fixed nodule at the 3:30 o'clock position an ultrasound of this area confirmed an irregular hypoechoic mass measuring 0.7 cm. There was a 0.2 cm adjacent satellite nodule. There was no left axillary lymphadenopathy.   biopsy of this mass was obtained 06/15/2015, and showed ([id number]) an  invasive ductal carcinoma, grade 1, estrogen receptor 100% positive, progesterone receptor 40% positive, both with strong staining intensity, with an MIB-1 of 11%, and no HER-2 amplification, the signals ratio being 1.39 and the number per cell 2.15.   Astra also has a history of prior tobacco abuse, status post right upper lobectomy for lung cancer, with significant emphysema.   The patient's subsequent history is as detailed below  INTERVAL HISTORY:  Jessica Gilbert returns today for follow-up of her left-sided breast cancer. Since her last visit here she underwent left lumpectomy and sentinel lymph node sampling, on 07/17/2015. The final pathology ([id number]) showed an invasive ductal carcinoma, grade 2, measuring 0.9 cm. All 4 sentinel lymph nodes were clear. Margins were negative, although the closest margin 2 in situ disease was 1 mm. Repeat HER-2 was again negative, with a signals ratio of 1.41, the number per cell being 2.05. Estrogen receptor was 90% positive, progesterone receptor 2% positive, both with moderate staining intensity.  An Oncotype DX obtained from this procedure showed a recurrence score of 16 predicting a risk of this cancer coming back outside the breast within the next 10 years of 10% if the patient's only systemic therapy is tamoxifen for 5 years. It also predicted no benefit from chemotherapy.  Her case was also presented at the multidisciplinary breast cancer conference 08/15/2015. Margins were discussed and no further surgery was recommended. At that time it was felt the patient should proceed directly to radiation, to be followed by anti-estrogen therapy.  REVIEW OF SYSTEMS: Jessica Gilbert did fine with her surgery, with some soreness and "stinging pain" at times, but no fever or bleeding or other complications. She has some cataracts and some sinus problems. She has her chronic emphysema  issues. These are not any worse than baseline. Gilbert he detailed review of systems today was  noncontributory.  PAST MEDICAL HISTORY: Past Medical History  Diagnosis Date  . Lung cancer ([place]) 2005    Rt upper lobe  . Acute respiratory failure ([place])   . Celiac disease   . Human metapneumovirus pneumonia March 2015  . Emphysema lung ([place])   . Hypertension   . Hyperlipidemia   . Carotid stenosis, right   . Breast cancer ([place])   . Stroke [facility])     TIA w/o defecits  . History of hiatal hernia     PAST SURGICAL HISTORY: Past Surgical History  Procedure Laterality Date  . Lung removal, partial Right 2005    Upper lobe  . Trigger finger release    . Tubal ligation  1980  . Breast lumpectomy with radioactive seed and sentinel lymph node biopsy Left 07/17/2015    Procedure: LEFT BREAST LUMPECTOMY WITH RADIOACTIVE SEED WITH LEFT AXILLARY SENTINEL LYMPH NODE BIOPSY;  Surgeon: Jessica Overall, MD;  Location: [facility];  Service: General;  Laterality: Left;    FAMILY HISTORY Family History  Problem Relation Age of Onset  . Heart disease Father   . High blood pressure Father   . Congestive Heart Failure Father   . Leukemia Mother   . Colon polyps Mother   . High blood pressure Brother   . Lung cancer Brother   . Heart disease Brother   . Diabetes Brother   . Colon polyps Sister   . Breast cancer Sister   . High blood pressure Sister   . Emphysema Sister   . Pancreatic cancer Paternal Uncle    the patient's father died at the age of 47 with congestive 40 failure. Patient's mother died with acute leukemia at the age of 33. The patient had one brother, 2 sisters. One sister had breast cancer at age 84. One brother, smoker, was diagnosed with lung cancer at the age of 78. There is a paternal uncle who may have had pancreatic cancer. There is no history of ovarian cancer in the family.  GYNECOLOGIC HISTORY:  No LMP recorded. Patient is postmenopausal.  menarche age 16, first live birth age 33. The patient is GX P2. She stopped having periods in 1998, took hormone  replacement approximately 6 months. 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 At home she lives by herself, with 3 cats. Her son Jessica Gilbert  Lives in [place] and works as a Engineer, structural. The patient's other son died 37 years ago. Jessica Gilbert has 2 grandchildren. She is a Tourist information centre manager.    ADVANCED DIRECTIVES:  In place. Her son Jessica Gilbert is her healthcare power of attorney. He can be reached at [place]: Social History  Substance Use Topics  . Smoking status: Former Smoker -- 1.00 packs/day for 35 years    Types: Cigarettes    Quit date: 01/29/2004  . Smokeless tobacco: Never Used  . Alcohol Use: 0.0 oz/week    0 Standard drinks or equivalent per week     Colonoscopy:  PAP:  Bone density: remote.   Lipid panel:  Allergies  Allergen Reactions  . Aspirin Other (See Comments)    "burning stomach"  . Codeine Nausea Only    Current Outpatient Prescriptions  Medication Sig Dispense Refill  . albuterol (PROVENTIL HFA;VENTOLIN HFA) 108 (90 BASE)  MCG/ACT inhaler Inhale 2 puffs into the lungs every 6 (six) hours as needed for wheezing or shortness of breath. 1 Inhaler 3  . diphenhydrAMINE (BENADRYL) 25 MG tablet Take 25 mg by mouth every 8 (eight) hours as needed.    Marland Kitchen ibuprofen (ADVIL,MOTRIN) 200 MG tablet Take 200 mg by mouth every 6 (six) hours as needed.    . mometasone-formoterol (DULERA) 200-5 MCG/ACT AERO Inhale 2 puffs into the lungs 2 (two) times daily. 1 Inhaler 5  . Multiple Vitamin (MULTIVITAMIN WITH MINERALS) TABS tablet Take 1 tablet by mouth daily.    Marland Kitchen telmisartan (MICARDIS) 40 MG tablet Take 40 mg by mouth daily.    . traMADol (ULTRAM) 50 MG tablet Take 1-2 tablets (50-100 mg total) by mouth every 6 (six) hours as needed. (Patient not taking: Reported on 08/15/2015) 30 tablet 1   No current facility-administered medications for this visit.     OBJECTIVE:  Middle-aged white woman who appears stated age 44 Vitals:   09/07/15 1255  BP: 156/67  Pulse: 86  Temp: 97.9 F (36.6 C)  Resp: 18     Body mass index is 21.87 kg/(m^2).    ECOG FS:1 - Symptomatic but completely ambulatory  Sclerae unicteric, pupils round and equal Oropharynx clear and moist-- no thrush or other lesions No cervical or supraclavicular adenopathy Lungs no rales or rhonchi Heart regular rate and rhythm Abd soft, nontender, positive bowel sounds MSK no focal spinal tenderness, no upper extremity lymphedema Neuro: nonfocal, well oriented, appropriate affect Breasts: the right breast is unremarkable. The left breast status post lumpectomy. There is no  He has since, swelling, or erythema. The cosmetic result is good. The left axilla is benign.  LAB RESULTS:  CMP     Component Value Date/Time   NA 139 06/27/2015 0831   NA 141 08/30/2013 0345   K 4.6 06/27/2015 0831   K 4.1 08/30/2013 0345   CL 101 08/30/2013 0345   CO2 26 06/27/2015 0831   CO2 28 08/30/2013 0345   GLUCOSE 83 06/27/2015 0831   GLUCOSE 101* 08/30/2013 0345   BUN 15.2 06/27/2015 0831   BUN 12 08/30/2013 0345   CREATININE 0.8 06/27/2015 0831   CREATININE 0.60 09/04/2013 0347   CALCIUM 9.7 06/27/2015 0831   CALCIUM 8.5 08/30/2013 0345   PROT 7.3 06/27/2015 0831   PROT 7.3 08/28/2013 0937   ALBUMIN 3.7 06/27/2015 0831   ALBUMIN 3.6 08/28/2013 0937   AST 23 06/27/2015 0831   AST 41* 08/28/2013 0937   ALT 19 06/27/2015 0831   ALT 28 08/28/2013 0937   ALKPHOS 81 06/27/2015 0831   ALKPHOS 102 08/28/2013 0937   BILITOT 0.33 06/27/2015 0831   BILITOT 0.4 08/28/2013 0937   GFRNONAA >90 09/04/2013 0347   GFRAA >90 09/04/2013 0347    INo results found for: SPEP, UPEP  Lab Results  Component Value Date   WBC 9.8 06/27/2015   NEUTROABS 6.6* 06/27/2015   HGB 13.9 06/27/2015   HCT 43.1 06/27/2015   MCV 87.7 06/27/2015   PLT 365 06/27/2015      Chemistry      Component  Value Date/Time   NA 139 06/27/2015 0831   NA 141 08/30/2013 0345   K 4.6 06/27/2015 0831   K 4.1 08/30/2013 0345   CL 101 08/30/2013 0345   CO2 26 06/27/2015 0831   CO2 28 08/30/2013 0345   BUN 15.2 06/27/2015 0831   BUN 12 08/30/2013 0345   CREATININE 0.8 06/27/2015 0831  CREATININE 0.60 09/04/2013 0347      Component Value Date/Time   CALCIUM 9.7 06/27/2015 0831   CALCIUM 8.5 08/30/2013 0345   ALKPHOS 81 06/27/2015 0831   ALKPHOS 102 08/28/2013 0937   AST 23 06/27/2015 0831   AST 41* 08/28/2013 0937   ALT 19 06/27/2015 0831   ALT 28 08/28/2013 0937   BILITOT 0.33 06/27/2015 0831   BILITOT 0.4 08/28/2013 0937       No results found for: LABCA2  No components found for: SRPRX458  No results for input(s): INR in the last 168 hours.  Urinalysis No results found for: COLORURINE, APPEARANCEUR, LABSPEC, PHURINE, GLUCOSEU, HGBUR, BILIRUBINUR, KETONESUR, PROTEINUR, UROBILINOGEN, NITRITE, LEUKOCYTESUR  STUDIES: No results found.  ASSESSMENT: 70 y.o.  [place] woman status post left breast biopsy 06/15/2015 for a clinical mT1c N0, stage IA  Invasive ductal carcinoma, grade 1, estrogen and progesterone receptor positive, HER-2 not amplified, with an MIB-1 of 11%  (1) status post left lumpectomy with sentinel lymph node sampling 07/17/2015 for a pT1b pN0, stage IA invasive ductal carcinoma, grade 2, with negative though close margins; repeat HER-2 was again negative, and estrogen and progesterone receptors were again positive  (2) Oncotype DX score of 16 predicts an outside the breast risk of recurrence within 10 years of 10% if the patient's only systemic therapy is tamoxifen for 5 years. It also her next no benefit from chemotherapy  (3)  The patient opted against adjuvant radiation  (4)  Anastrozole started 09/07/2015  (5) Factor V Leiden positive  (6) s/p right upper lobectomy after chemo-radiation August  2005 for a primary lung cancer  PLAN: I discussed Jessica Gilbert  situation in detail with her. While her margins are negative the margin for DCIS is close at 1 mm. The hazard ratio for recurrence as compared to a positive margin is in the 0.55 range, so clearly this is better than having a positive margin but I would feel more comfortable if she received radiation.  However she decided she does not want it. The reason for that is that she has already had radiation involving the right lung and she knows it would be some radiation scatter to the left lung. She has significant emphysema. This is the reason she has decided, quite firmly, not to proceed with radiation therapy.  Accordingly she is ready to start anti-estrogens at this point. We discussed the difference between tamoxifen and aromatase inhibitors and she has a good understanding of the possible toxicities, side effects and complications of these agents. We also discussed her Oncotype DX which tells [place] she has a  90% chance of this cancer not recurring outside her breast within the next 10 years if she takes tamoxifen for 5 years  If instead of that she uses anastrozole for 5 years, her risk will be even better, approximately 93% chance of the cancer not recurring within 10 years outside the breast. Of course anti-estrogens will also reduce the risk of local recurrence.  Because Risha sister is already on anastrozole, she favors that drug. She was started today and I asked her to alert [place] if they ask her to pay more than $12-$15 a month.  She has not had a bone density in many years. I have set her up to have one at the [facility] in May. She will see me again in June. Depending on those results we will consider whether adding denosumab every 6 months for the next 2 years is advisable.  Given her history  of lung cancer at some point late this year we will obtain a screening chest CT.  Denishia has a good understanding of this plan. She agrees with it. She will call with any problems that may develop before  the next visit. Jessica Cruel, MD   09/07/2015 1:03 PM Medical Oncology and Hematology [facility] [address] [place], [place] [address] Tel. [phone]    Fax. [phone]

## 2015-09-10 ENCOUNTER — Telehealth: Payer: Self-pay | Admitting: *Deleted

## 2015-09-10 DIAGNOSIS — C50512 Malignant neoplasm of lower-outer quadrant of left female breast: Secondary | ICD-10-CM

## 2015-09-10 NOTE — Telephone Encounter (Signed)
Spoke with patient to discuss survivorship care plan.  Patient does not want to come in for this appointment.  Informed her we could mail her care plan.  She agreed to this.  Encouraged her to call with any needs or concerns.

## 2015-09-28 ENCOUNTER — Encounter: Payer: Self-pay | Admitting: Nurse Practitioner

## 2015-09-28 DIAGNOSIS — C50512 Malignant neoplasm of lower-outer quadrant of left female breast: Secondary | ICD-10-CM

## 2015-09-28 NOTE — Progress Notes (Signed)
The Survivorship Care Plan was mailed to Jessica Gilbert as she reported not being able to come in to the Survivorship Clinic for an in-person visit at this time. A letter was mailed to her outlining the purpose of the content of the care plan, as well as encouraging her to reach out to me with any questions or concerns.  My business card was included in the correspondence to the patient as well.  A copy of the care plan was also routed/faxed/mailed to Jessica Pac, MD, the patient's PCP.  I will not be placing any follow-up appointments to the Survivorship Clinic for Jessica Gilbert, but I am happy to see her at any time in the future for any survivorship concerns that may arise. Thank you for allowing me to participate in her care!  Kenn File, [place] [phone]

## 2015-11-05 ENCOUNTER — Ambulatory Visit
Admission: RE | Admit: 2015-11-05 | Discharge: 2015-11-05 | Disposition: A | Discharge status: Home / Self Care | Payer: 59 | Source: Ambulatory Visit | Attending: Oncology | Admitting: Oncology

## 2015-11-05 DIAGNOSIS — C50512 Malignant neoplasm of lower-outer quadrant of left female breast: Secondary | ICD-10-CM

## 2015-12-04 ENCOUNTER — Telehealth: Payer: Self-pay | Admitting: Oncology

## 2015-12-04 ENCOUNTER — Ambulatory Visit (HOSPITAL_BASED_OUTPATIENT_CLINIC_OR_DEPARTMENT_OTHER): Payer: 59 | Admitting: Oncology

## 2015-12-04 VITALS — BP 149/81 | HR 80 | Temp 98.0°F | Resp 17 | Ht 64.0 in | Wt 126.0 lb

## 2015-12-04 DIAGNOSIS — D6851 Activated protein C resistance: Secondary | ICD-10-CM

## 2015-12-04 DIAGNOSIS — Z85118 Personal history of other malignant neoplasm of bronchus and lung: Secondary | ICD-10-CM

## 2015-12-04 DIAGNOSIS — R05 Cough: Secondary | ICD-10-CM

## 2015-12-04 DIAGNOSIS — M8580 Other specified disorders of bone density and structure, unspecified site: Secondary | ICD-10-CM

## 2015-12-04 DIAGNOSIS — R0602 Shortness of breath: Secondary | ICD-10-CM

## 2015-12-04 DIAGNOSIS — C50512 Malignant neoplasm of lower-outer quadrant of left female breast: Secondary | ICD-10-CM

## 2015-12-04 DIAGNOSIS — R5383 Other fatigue: Secondary | ICD-10-CM

## 2015-12-04 MED ORDER — EXEMESTANE 25 MG PO TABS: 25.0000 mg | ORAL_TABLET | Freq: Every day | ORAL | Status: DC

## 2015-12-04 NOTE — Progress Notes (Signed)
[place]  [phone] [phone]     ID: Jessica Gilbert DOB: 1945/09/07  MR#: [id number]  QQV#:[id number]  Patient Care Team: [name], MD as PCP - General (Family Medicine) [name], MD as Consulting Physician (General Surgery) Chauncey Cruel, MD as Consulting Physician (Oncology) [name], MD as Attending Physician (Radiation Oncology) [name], NP as Nurse Practitioner (Hematology and Oncology) PCP: [name], MD GYN: OTHER MD:  CHIEF COMPLAINT:  Estrogen receptor positive breast cancer  CURRENT TREATMENT:  Exemestane   BREAST CANCER HISTORY: From the original intake note:   Jessica Gilbert (pronounced "ro-lund") had bilateral screening mammography at the [facility] Center 05/26/2014 showing a possible mass in the left breast. Left digital mammography with ultrasonography 06/14/2014 found a breast density to be category C. There was no persistent mass or distortion or malignant-type microcalcifications. . There was no palpable mass. Ultrasound showed a well circumscribed benign appearing nodule in the left breast 2:00 position measuring 4 mm. This was felt to be most likely benign but repeat ultrasound  At 6 months was recommended and thiswas performed 12/14/2014.  The mass was unchanged an incidental note was made of a benign appearing 0.5 cm lymph node in the left breast 2:00 position.   On 06/15/2015 Jessica Gilbert had bilateral diagnostic mammography with tomosynthesis and this time an irregular mass with spiculated margins was noted in the outer lower left breast measuring approximately 9 mm. Physical examination confirmed a firm fixed nodule at the 3:30 o'clock position an ultrasound of this area confirmed an irregular hypoechoic mass measuring 0.7 cm. There was a 0.2 cm adjacent satellite nodule. There was no left axillary lymphadenopathy.   biopsy of this mass was obtained 06/15/2015, and showed ([id number]) an  invasive ductal carcinoma, grade 1, estrogen receptor 100% positive, progesterone receptor 40% positive, both with strong staining intensity, with an MIB-1 of 11%, and no HER-2 amplification, the signals ratio being 1.39 and the number per cell 2.15.   Jessica Gilbert also has a history of prior tobacco abuse, status post right upper lobectomy for lung cancer, with significant emphysema.   The patient's subsequent history is as detailed below  INTERVAL HISTORY: Jessica Gilbert returns today for follow-up of her estrogen receptor positive breast cancer. At the last visit she was started on anastrozole she took it for about 6 weeks, initially with good tolerance. After that dose she started to having "head and foot disconnection". She felt like she had a balance problem and was staggering. Subsequently she developed some vision changes as well and she stopped the medication at that point, after taking it for about 6 weeks. Within a few days of stopping things went back to normal. She does not want to give anastrozole any further cries.  In addition she tried Caltrate slow release. She has had problems with vitamin D previously. It causes her optical migraines. Even though this was slow release and gave her a significant stomach upset, which didn't go away until she stopped taking the medication.  REVIEW OF SYSTEMS: Jessica Gilbert has chronic shortness of breath which is not currently worse than usual, and she does not have pleurisy. She does have a bit of a cough which is productive of clear phlegm and she is taking some Mucinex and Benadryl to deal with that. She describes herself is moderately fatigued. A detailed review of systems today was otherwise stable  PAST MEDICAL HISTORY: Past Medical History  Diagnosis Date  . Lung cancer ([place]) 2005  Rt upper lobe  . Acute respiratory failure ([place])   . Celiac disease   . Human metapneumovirus pneumonia March 2015  . Emphysema lung ([place])   . Hypertension   . Hyperlipidemia     . Carotid stenosis, right   . Breast cancer ([place])   . Stroke [facility])     TIA w/o defecits  . History of hiatal hernia     PAST SURGICAL HISTORY: Past Surgical History  Procedure Laterality Date  . Lung removal, partial Right 2005    Upper lobe  . Trigger finger release    . Tubal ligation  1980  . Breast lumpectomy with radioactive seed and sentinel lymph node biopsy Left 07/17/2015    Procedure: LEFT BREAST LUMPECTOMY WITH RADIOACTIVE SEED WITH LEFT AXILLARY SENTINEL LYMPH NODE BIOPSY;  Surgeon: [name], MD;  Location: [facility];  Service: General;  Laterality: Left;    FAMILY HISTORY Family History  Problem Relation Age of Onset  . Heart disease Father   . High blood pressure Father   . Congestive Heart Failure Father   . Leukemia Mother   . Colon polyps Mother   . High blood pressure Brother   . Lung cancer Brother   . Heart disease Brother   . Diabetes Brother   . Colon polyps Sister   . Breast cancer Sister   . High blood pressure Sister   . Emphysema Sister   . Pancreatic cancer Paternal Uncle    the patient's father died at the age of 79 with congestive 40 failure. Patient's mother died with acute leukemia at the age of 48. The patient had one brother, 2 sisters. One sister had breast cancer at age 48. One brother, smoker, was diagnosed with lung cancer at the age of 49. There is a paternal uncle who may have had pancreatic cancer. There is no history of ovarian cancer in the family.  GYNECOLOGIC HISTORY:  No LMP recorded. Patient is postmenopausal.  menarche age 75, first live birth age 89. The patient is GX P2. She stopped having periods in 1998, took hormone replacement approximately 6 months. 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 At home she lives by herself, with 3 cats. Her son Jessica Gilbert  Lives in [place] and works as  a Engineer, structural. The patient's other son died 50 years ago. Jessica Gilbert has 2 grandchildren. She is a Tourist information centre manager.    ADVANCED DIRECTIVES:  In place. Her son Jessica Gilbert is her healthcare power of attorney. He can be reached at [place]: Social History  Substance Use Topics  . Smoking status: Former Smoker -- 1.00 packs/day for 35 years    Types: Cigarettes    Quit date: 01/29/2004  . Smokeless tobacco: Never Used  . Alcohol Use: 0.0 oz/week    0 Standard drinks or equivalent per week     Colonoscopy:  PAP:  Bone density: remote.   Lipid panel:  Allergies  Allergen Reactions  . Aspirin Other (See Comments)    "burning stomach"  . Codeine Nausea Only    Current Outpatient Prescriptions  Medication Sig Dispense Refill  . albuterol (PROVENTIL HFA;VENTOLIN HFA) 108 (90 BASE) MCG/ACT inhaler Inhale 2 puffs into the lungs every 6 (six) hours as needed for wheezing or shortness of breath. 1 Inhaler 3  . anastrozole (ARIMIDEX) 1 MG tablet Take 1 tablet (1 mg  total) by mouth daily. 90 tablet 4  . diphenhydrAMINE (BENADRYL) 25 MG tablet Take 25 mg by mouth every 8 (eight) hours as needed.    Marland Kitchen ibuprofen (ADVIL,MOTRIN) 200 MG tablet Take 200 mg by mouth every 6 (six) hours as needed.    . mometasone-formoterol (DULERA) 200-5 MCG/ACT AERO Inhale 2 puffs into the lungs 2 (two) times daily. 1 Inhaler 5  . Multiple Vitamin (MULTIVITAMIN WITH MINERALS) TABS tablet Take 1 tablet by mouth daily.    Marland Kitchen telmisartan (MICARDIS) 40 MG tablet Take 40 mg by mouth daily.     No current facility-administered medications for this visit.    OBJECTIVE:  Middle-aged white woman In no acute distress Filed Vitals:   12/04/15 1043  BP: 149/81  Pulse: 80  Temp: 98 F (36.7 C)  Resp: 17     Body mass index is 21.62 kg/(m^2).    ECOG FS:1 - Symptomatic but completely ambulatory  Sclerae unicteric, EOMs intact Oropharynx clear, dentition in good repair No cervical or supraclavicular  adenopathy Lungs no rales or rhonchi Heart regular rate and rhythm Abd soft, nontender all quadrants, positive bowel sounds MSK no focal spinal tenderness, no upper extremity lymphedema Neuro: nonfocal, well oriented, appropriate affect Breasts: Deferred  LAB RESULTS:  CMP     Component Value Date/Time   NA 139 06/27/2015 0831   NA 141 08/30/2013 0345   K 4.6 06/27/2015 0831   K 4.1 08/30/2013 0345   CL 101 08/30/2013 0345   CO2 26 06/27/2015 0831   CO2 28 08/30/2013 0345   GLUCOSE 83 06/27/2015 0831   GLUCOSE 101* 08/30/2013 0345   BUN 15.2 06/27/2015 0831   BUN 12 08/30/2013 0345   CREATININE 0.8 06/27/2015 0831   CREATININE 0.60 09/04/2013 0347   CALCIUM 9.7 06/27/2015 0831   CALCIUM 8.5 08/30/2013 0345   PROT 7.3 06/27/2015 0831   PROT 7.3 08/28/2013 0937   ALBUMIN 3.7 06/27/2015 0831   ALBUMIN 3.6 08/28/2013 0937   AST 23 06/27/2015 0831   AST 41* 08/28/2013 0937   ALT 19 06/27/2015 0831   ALT 28 08/28/2013 0937   ALKPHOS 81 06/27/2015 0831   ALKPHOS 102 08/28/2013 0937   BILITOT 0.33 06/27/2015 0831   BILITOT 0.4 08/28/2013 0937   GFRNONAA >90 09/04/2013 0347   GFRAA >90 09/04/2013 0347    INo results found for: SPEP, UPEP  Lab Results  Component Value Date   WBC 9.8 06/27/2015   NEUTROABS 6.6* 06/27/2015   HGB 13.9 06/27/2015   HCT 43.1 06/27/2015   MCV 87.7 06/27/2015   PLT 365 06/27/2015      Chemistry      Component Value Date/Time   NA 139 06/27/2015 0831   NA 141 08/30/2013 0345   K 4.6 06/27/2015 0831   K 4.1 08/30/2013 0345   CL 101 08/30/2013 0345   CO2 26 06/27/2015 0831   CO2 28 08/30/2013 0345   BUN 15.2 06/27/2015 0831   BUN 12 08/30/2013 0345   CREATININE 0.8 06/27/2015 0831   CREATININE 0.60 09/04/2013 0347      Component Value Date/Time   CALCIUM 9.7 06/27/2015 0831   CALCIUM 8.5 08/30/2013 0345   ALKPHOS 81 06/27/2015 0831   ALKPHOS 102 08/28/2013 0937   AST 23 06/27/2015 0831   AST 41* 08/28/2013 0937   ALT 19  06/27/2015 0831   ALT 28 08/28/2013 0937   BILITOT 0.33 06/27/2015 0831   BILITOT 0.4 08/28/2013 8329  No results found for: LABCA2  No components found for: CHYIF027  No results for input(s): INR in the last 168 hours.  Urinalysis No results found for: COLORURINE, APPEARANCEUR, LABSPEC, PHURINE, GLUCOSEU, HGBUR, BILIRUBINUR, KETONESUR, PROTEINUR, UROBILINOGEN, NITRITE, LEUKOCYTESUR  STUDIES: Dg Bone Density  11/05/2015  EXAM: DUAL X-RAY ABSORPTIOMETRY (DXA) FOR BONE MINERAL DENSITY IMPRESSION: Referring Physician:  Chauncey Cruel PATIENT: Name: Jessica Gilbert, Jessica Gilbert Patient ID: [id number] Birth Date: 1945/06/30 Height: 64.3 in. Sex: Female Measured: 11/05/2015 Weight: 125.0 lbs. Indications: Anastrazole, Breast Cancer History, Caucasian, Estrogen Deficient, Height Loss (781.91), Low Calcium Intake (269.3), Postmenopausal, Secondary Osteoporosis Fractures: None Treatments: None ASSESSMENT: The BMD measured at Femur Neck Right is 0.764 g/cm2 with a T-score of -2.0. This patient is considered osteopenic according to [facility]) criteria. L-3, L-4 were excluded due to degenerative changes. Spine was not compared to prior study due to exclusion of vertebral bodies on current exam. There has been a statistically significant decrease in BMD of Left hip since prior exam dated 03/30/2007. Site Region Measured Date Measured Age YA BMD Significant CHANGE T-score DualFemur Neck Right 11/05/2015    [age]         -2.0    0.764 g/cm2 AP Spine  L1-L2      11/05/2015    [age]         -0.4    1.129 g/cm2 World Health Organization [facility]) criteria for post-menopausal, Caucasian Women: Normal       T-score at or above -1 SD Osteopenia   T-score between -1 and -2.5 SD Osteoporosis T-score at or below -2.5 SD RECOMMENDATION: [place] recommends that FDA-approved medical therapies be considered in postmenopausal women and men age 52 or older with a: 1. Hip or vertebral  (clinical or morphometric) fracture. 2. T-score of <-2.5 at the spine or hip. 3. Ten-year fracture probability by FRAX of 3% or greater for hip fracture or 20% or greater for major osteoporotic fracture. All treatment decisions require clinical judgment and consideration of individual patient factors, including patient preferences, co-morbidities, previous drug use, risk factors not captured in the FRAX model (e.g. falls, vitamin D deficiency, increased bone turnover, interval significant decline in bone density) and possible under - or over-estimation of fracture risk by FRAX. All patients should ensure an adequate intake of dietary calcium (1200 mg/d) and vitamin D (800 IU daily) unless contraindicated. FOLLOW-UP: People with diagnosed cases of osteoporosis or at high risk for fracture should have regular bone mineral density tests. For patients eligible for Medicare, routine testing is allowed once every 2 years. The testing frequency can be increased to one year for patients who have rapidly progressing disease, those who are receiving or discontinuing medical therapy to restore bone mass, or have additional risk factors. I have reviewed this report, and agree with the above findings.  Radiology FRAX* 10-year Probability of Fracture Based on femoral neck BMD: DualFemur (Right) Major Osteoporotic Fracture: 10.6% Hip Fracture:                2.0% Population:                  [place] (Caucasian) Risk Factors:                Secondary Osteoporosis *FRAX is a Materials engineer of the [employer] for Metabolic Bone Disease, a [place] (WHO) [facility]. ASSESSMENT: The probability of a major osteoporotic fracture is 10.6 % within the next ten years. The  probability of a hip fracture is 2.0 % within the next ten years. Electronically Signed   By: David  Martinique M.D.   On: 11/05/2015 09:56    ASSESSMENT: 70 y.o.  [place] woman status post left breast lower  outer quadrant biopsy 06/15/2015 for a clinical mT1c N0, stage IA  Invasive ductal carcinoma, grade 1, estrogen and progesterone receptor positive, HER-2 not amplified, with an MIB-1 of 11%  (1) status post left lumpectomy with sentinel lymph node sampling 07/17/2015 for a pT1b pN0, stage IA invasive ductal carcinoma, grade 2, with negative though close margins; repeat HER-2 was again negative, and estrogen and progesterone receptors were again positive  (2) Oncotype DX score of 16 predicts an outside the breast risk of recurrence within 10 years of 10% if the patient's only systemic therapy is tamoxifen for 5 years. It also her next no benefit from chemotherapy  (3)  The patient opted against adjuvant radiation  (4)  Anastrozole started 09/07/2015  (a) bone density at the [facility] 11/05/2015 shows a T score of -2.0   (5) Factor V Leiden positive  (6) s/p right upper lobectomy after chemo-radiation August  2005 for a primary lung cancer  PLAN: Adrean was unable to tolerate anastrozole. It is unlikely she would be able to tolerate letrozole which is very similar to it. She is not a candidate for tamoxifen because she has a factor V Leiden mutation  Accordingly we are going to consider exemestane. We discussed the possible toxicities, side effects and complications of this agent. We also discussed cost issues since this is generally the most expensive among the aromatase inhibitors because it was the final one to become generic. Many insurance companies do not included in their formulary.  We also discussed the bone density issue. She has significant osteopenia. In addition to that she is intolerant of vitamin D. We discussed bisphosphonates but my recommendation was that she consider denosumab/Prolia. She tells me she is going to be seeing her dentist soon and she will bring him my notes to try to get clearance. (There is a question whether she will need an implant placed, which would  certainly at least delay the start of that medication).  I have made her a return appointment with me in 3 months and if she is tolerating exemestane well we will continue that for 5 years. If she is not we will have to follow with observation alone  She knows to call for any problems that may develop before her next visit.   :Chauncey Cruel, MD   12/04/2015 10:44 AM Medical Oncology and Hematology [facility] [address] [place], [place] [address] Tel. [phone]    Fax. [phone]

## 2015-12-04 NOTE — Telephone Encounter (Signed)
appt made and avs printed °

## 2016-03-04 ENCOUNTER — Ambulatory Visit (HOSPITAL_BASED_OUTPATIENT_CLINIC_OR_DEPARTMENT_OTHER): Payer: 59 | Admitting: Oncology

## 2016-03-04 VITALS — BP 137/79 | HR 71 | Temp 98.2°F | Resp 17 | Ht 64.0 in | Wt 127.3 lb

## 2016-03-04 DIAGNOSIS — D6851 Activated protein C resistance: Secondary | ICD-10-CM | POA: Diagnosis not present

## 2016-03-04 DIAGNOSIS — Z85118 Personal history of other malignant neoplasm of bronchus and lung: Secondary | ICD-10-CM

## 2016-03-04 DIAGNOSIS — M8580 Other specified disorders of bone density and structure, unspecified site: Secondary | ICD-10-CM

## 2016-03-04 DIAGNOSIS — C3411 Malignant neoplasm of upper lobe, right bronchus or lung: Secondary | ICD-10-CM

## 2016-03-04 DIAGNOSIS — C50512 Malignant neoplasm of lower-outer quadrant of left female breast: Secondary | ICD-10-CM | POA: Diagnosis not present

## 2016-03-04 NOTE — Progress Notes (Signed)
[facility]  [phone]) [phone] Fax:(336) [phone]     ID: Jessica Gilbert DOB: [birth year]  MR#: [id number]  [id number]#:[id number]  Patient Care Team: [name], MD as PCP - General (Family Medicine) [name], MD as Consulting Physician (General Surgery) Chauncey Cruel, MD as Consulting Physician (Oncology) [name], MD as Attending Physician (Radiation Oncology) [name], NP as Nurse Practitioner (Hematology and Oncology) PCP: [name], MD GYN: OTHER MD:  CHIEF COMPLAINT:  Estrogen receptor positive breast cancer  CURRENT TREATMENT:  Observation   BREAST CANCER HISTORY: From the original intake note:   Jessica Gilbert (pronounced "ro-lund") had bilateral screening mammography at the [facility] Center 05/26/2014 showing a possible mass in the left breast. Left digital mammography with ultrasonography 06/14/2014 found a breast density to be category C. There was no persistent mass or distortion or malignant-type microcalcifications. . There was no palpable mass. Ultrasound showed a well circumscribed benign appearing nodule in the left breast 2:00 position measuring 4 mm. This was felt to be most likely benign but repeat ultrasound  At 6 months was recommended and thiswas performed 12/14/2014.  The mass was unchanged an incidental note was made of a benign appearing 0.5 cm lymph node in the left breast 2:00 position.   On 06/15/2015 Jessica Gilbert had bilateral diagnostic mammography with tomosynthesis and this time an irregular mass with spiculated margins was noted in the outer lower left breast measuring approximately 9 mm. Physical examination confirmed a firm fixed nodule at the 3:30 o'clock position an ultrasound of this area confirmed an irregular hypoechoic mass measuring 0.7 cm. There was a 0.2 cm adjacent satellite nodule. There was no left axillary lymphadenopathy.   biopsy of this mass was obtained 06/15/2015, and showed ([id number]) an  invasive ductal carcinoma, grade 1, estrogen receptor 100% positive, progesterone receptor 40% positive, both with strong staining intensity, with an MIB-1 of 11%, and no HER-2 amplification, the signals ratio being 1.39 and the number per cell 2.15.   Jessica Gilbert also has a history of prior tobacco abuse, status post right upper lobectomy for lung cancer, with significant emphysema.   The patient's subsequent history is as detailed below  INTERVAL HISTORY: Xiadani returns today for follow-up of her breast cancer. We started her on exemestane at the last visit. She obtain it for $15 a month. She did not have hot flashes or vaginal dryness. After several weeks 100 though she felt depressed, very Moody, crying for no reason, and also developed diarrhea, up to 4 times a day. She went off the medication about a week or so ago. The moodiness and diarrhea is much improved. He does not believe she can take that medication.  REVIEW OF SYSTEMS: Jessica Gilbert describes herself is moderately fatigued. She is walking some for exercise. Aside from that a detailed review of systems today was stable  PAST MEDICAL HISTORY: Past Medical History:  Diagnosis Date  . Acute respiratory failure ([place])   . Breast cancer ([place])   . Carotid stenosis, right   . Celiac disease   . Emphysema lung ([place])   . History of hiatal hernia   . Human metapneumovirus pneumonia March 2015  . Hyperlipidemia   . Hypertension   . Lung cancer ([place]) 2005   Rt upper lobe  . Stroke ([place])    TIA w/o defecits    PAST SURGICAL HISTORY: Past Surgical History:  Procedure Laterality Date  . BREAST LUMPECTOMY WITH RADIOACTIVE SEED AND SENTINEL LYMPH NODE BIOPSY Left 07/17/2015  Procedure: LEFT BREAST LUMPECTOMY WITH RADIOACTIVE SEED WITH LEFT AXILLARY SENTINEL LYMPH NODE BIOPSY;  Surgeon: [name], MD;  Location: [facility];  Service: General;  Laterality: Left;  . LUNG REMOVAL, PARTIAL Right 2005   Upper lobe  . TRIGGER FINGER  RELEASE    . TUBAL LIGATION  1980    FAMILY HISTORY Family History  Problem Relation Age of Onset  . Heart disease Father   . High blood pressure Father   . Congestive Heart Failure Father   . Leukemia Mother   . Colon polyps Mother   . High blood pressure Brother   . Lung cancer Brother   . Heart disease Brother   . Diabetes Brother   . Colon polyps Sister   . Breast cancer Sister   . High blood pressure Sister   . Emphysema Sister   . Pancreatic cancer Paternal Uncle    the patient's father died at the age of 23 with congestive 40 failure. Patient's mother died with acute leukemia at the age of 39. The patient had one brother, 2 sisters. One sister had breast cancer at age 50. One brother, smoker, was diagnosed with lung cancer at the age of 64. There is a paternal uncle who may have had pancreatic cancer. There is no history of ovarian cancer in the family.  GYNECOLOGIC HISTORY:  No LMP recorded. Patient is postmenopausal.  menarche age 45, first live birth age 86. The patient is GX P2. She stopped having periods in 1998, took hormone replacement approximately 6 months. 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 At home she lives by herself, with 3 cats. Her son Jessica Gilbert  Lives in [place] and works as a Engineer, structural. The patient's other son died 76 years ago. Jessica Gilbert has 2 grandchildren. She is a Tourist information centre manager.    ADVANCED DIRECTIVES:  In place. Her son Jessica Gilbert is her healthcare power of attorney. He can be reached at [place]: Social History  Substance Use Topics  . Smoking status: Former Smoker    Packs/day: 1.00    Years: 35.00    Types: Cigarettes    Quit date: 01/29/2004  . Smokeless tobacco: Never Used  . Alcohol use 0.0 oz/week     Colonoscopy:  PAP:  Bone density: remote.   Lipid panel:  Allergies  Allergen Reactions    . Aspirin Other (See Comments)    "burning stomach"  . Codeine Nausea Only  . Vitamin D Analogs Other (See Comments)    Current Outpatient Prescriptions  Medication Sig Dispense Refill  . albuterol (PROVENTIL HFA;VENTOLIN HFA) 108 (90 BASE) MCG/ACT inhaler Inhale 2 puffs into the lungs every 6 (six) hours as needed for wheezing or shortness of breath. 1 Inhaler 3  . diphenhydrAMINE (BENADRYL) 25 MG tablet Take 25 mg by mouth every 8 (eight) hours as needed.    Marland Kitchen guaiFENesin (MUCINEX) 600 MG 12 hr tablet Take by mouth 2 (two) times daily.    Marland Kitchen ibuprofen (ADVIL,MOTRIN) 200 MG tablet Take 200 mg by mouth every 6 (six) hours as needed.    . mometasone-formoterol (DULERA) 200-5 MCG/ACT AERO Inhale 2 puffs into the lungs 2 (two) times daily. 1 Inhaler 5  . Multiple Vitamin (MULTIVITAMIN WITH MINERALS) TABS tablet Take 1 tablet by mouth daily.    Marland Kitchen telmisartan (MICARDIS) 40 MG tablet Take 40 mg by mouth  daily.     No current facility-administered medications for this visit.     OBJECTIVE:  Middle-aged white woman Who appears stated age 70:   03/04/16 1031  BP: 137/79  Pulse: 71  Resp: 17  Temp: 98.2 F (36.8 C)     Body mass index is 21.85 kg/m.    ECOG FS:1 - Symptomatic but completely ambulatory  Sclerae unicteric, pupils round and equal Oropharynx clear and moist-- no thrush or other lesions No cervical or supraclavicular adenopathy Lungs no rales or rhonchi Heart regular rate and rhythm Abd soft, nontender, positive bowel sounds MSK no focal spinal tenderness, no upper extremity lymphedema Neuro: nonfocal, well oriented, appropriate affect Breasts: The right breast is unremarkable. The left breast is status post lumpectomy. The cosmetic result is good. There is no evidence of local recurrence. The left axilla is benign.    LAB RESULTS:  CMP     Component Value Date/Time   NA 139 06/27/2015 0831   K 4.6 06/27/2015 0831   CL 101 08/30/2013 0345   CO2 26 06/27/2015  0831   GLUCOSE 83 06/27/2015 0831   BUN 15.2 06/27/2015 0831   CREATININE 0.8 06/27/2015 0831   CALCIUM 9.7 06/27/2015 0831   PROT 7.3 06/27/2015 0831   ALBUMIN 3.7 06/27/2015 0831   AST 23 06/27/2015 0831   ALT 19 06/27/2015 0831   ALKPHOS 81 06/27/2015 0831   BILITOT 0.33 06/27/2015 0831   GFRNONAA >90 09/04/2013 0347   GFRAA >90 09/04/2013 0347    INo results found for: SPEP, UPEP  Lab Results  Component Value Date   WBC 9.8 06/27/2015   NEUTROABS 6.6 (H) 06/27/2015   HGB 13.9 06/27/2015   HCT 43.1 06/27/2015   MCV 87.7 06/27/2015   PLT 365 06/27/2015      Chemistry      Component Value Date/Time   NA 139 06/27/2015 0831   K 4.6 06/27/2015 0831   CL 101 08/30/2013 0345   CO2 26 06/27/2015 0831   BUN 15.2 06/27/2015 0831   CREATININE 0.8 06/27/2015 0831      Component Value Date/Time   CALCIUM 9.7 06/27/2015 0831   ALKPHOS 81 06/27/2015 0831   AST 23 06/27/2015 0831   ALT 19 06/27/2015 0831   BILITOT 0.33 06/27/2015 0831       No results found for: LABCA2  No components found for: LABCA125  No results for input(s): INR in the last 168 hours.  Urinalysis No results found for: COLORURINE, APPEARANCEUR, LABSPEC, PHURINE, GLUCOSEU, HGBUR, BILIRUBINUR, KETONESUR, PROTEINUR, UROBILINOGEN, NITRITE, LEUKOCYTESUR  STUDIES: No results found.  ASSESSMENT: 70 y.o.  [place] woman status post left breast lower outer quadrant biopsy 06/15/2015 for a clinical mT1c N0, stage IA  Invasive ductal carcinoma, grade 1, estrogen and progesterone receptor positive, HER-2 not amplified, with an MIB-1 of 11%  (1) status post left lumpectomy with sentinel lymph node sampling 07/17/2015 for a pT1b pN0, stage IA invasive ductal carcinoma, grade 2, with negative though close margins; repeat HER-2 was again negative, and estrogen and progesterone receptors were again positive  (2) Oncotype DX score of 16 predicts an outside the breast risk of recurrence within 10 years of 10% if  the patient's only systemic therapy is tamoxifen for 5 years. It also her next no benefit from chemotherapy  (3)  The patient opted against adjuvant radiation  (4)  Anastrozole started 09/07/2015 discontinued May 2017 with intolerance  (a) exemestane started June 2017, discontinued August 2017 with intolerance.  (b)  bone density at the [facility] 11/05/2015 shows a T score of -2.0   (5) Factor V Leiden positive  (6) s/p right upper lobectomy after chemo-radiation August  2005 for a primary lung cancer  PLAN: Tzirel gave the exemestane a good try, as she was not able to tolerated. As discussed previously, I think letrozole would be just like anastrozole for her and she is not a candidate for tamoxifen because of her factor V Leiden positivity  Accordingly we are going to go with observation alone. She will have a repeat mammography in January. I will see her again in February or March and I will see her on a yearly basis thereafter until she completes her 5 years of follow-up  As far as her osteopenia is concerned I gave her information on the [employer] program and the [facility] to recovery program.   She knows to call for any problems that may develop before her next visit here.   :Chauncey Cruel, MD   03/04/2016 11:01 AM Medical Oncology and Hematology [facility] [place], [place] [address] Tel. [phone]    Fax. [phone]

## 2016-08-29 ENCOUNTER — Telehealth: Payer: Self-pay | Admitting: Oncology

## 2016-08-29 NOTE — Telephone Encounter (Signed)
Patient called to reschedule appointments. 08/29/16

## 2016-09-01 ENCOUNTER — Other Ambulatory Visit: Payer: 59

## 2016-09-01 ENCOUNTER — Ambulatory Visit: Payer: 59 | Admitting: Oncology

## 2016-10-08 ENCOUNTER — Other Ambulatory Visit (HOSPITAL_BASED_OUTPATIENT_CLINIC_OR_DEPARTMENT_OTHER): Payer: 59

## 2016-10-08 ENCOUNTER — Ambulatory Visit (HOSPITAL_BASED_OUTPATIENT_CLINIC_OR_DEPARTMENT_OTHER): Payer: 59 | Admitting: Oncology

## 2016-10-08 VITALS — BP 152/85 | HR 84 | Temp 97.9°F | Resp 79 | Ht 64.0 in | Wt 125.1 lb

## 2016-10-08 DIAGNOSIS — D6851 Activated protein C resistance: Secondary | ICD-10-CM | POA: Diagnosis not present

## 2016-10-08 DIAGNOSIS — Z85118 Personal history of other malignant neoplasm of bronchus and lung: Secondary | ICD-10-CM | POA: Diagnosis not present

## 2016-10-08 DIAGNOSIS — C3411 Malignant neoplasm of upper lobe, right bronchus or lung: Secondary | ICD-10-CM

## 2016-10-08 DIAGNOSIS — C50512 Malignant neoplasm of lower-outer quadrant of left female breast: Secondary | ICD-10-CM | POA: Diagnosis not present

## 2016-10-08 DIAGNOSIS — Z170 Estrogen receptor positive status [ER+]: Principal | ICD-10-CM

## 2016-10-08 DIAGNOSIS — Z853 Personal history of malignant neoplasm of breast: Secondary | ICD-10-CM

## 2016-10-08 LAB — COMPREHENSIVE METABOLIC PANEL
ALBUMIN: 4.1 g/dL (ref 3.5–5.0)
ALT: 18 U/L (ref 0–55)
ANION GAP: 9 meq/L (ref 3–11)
AST: 23 U/L (ref 5–34)
Alkaline Phosphatase: 75 U/L (ref 40–150)
BILIRUBIN TOTAL: 0.42 mg/dL (ref 0.20–1.20)
BUN: 12.4 mg/dL (ref 7.0–26.0)
CALCIUM: 9.7 mg/dL (ref 8.4–10.4)
CO2: 25 mEq/L (ref 22–29)
Chloride: 106 mEq/L (ref 98–109)
Creatinine: 0.8 mg/dL (ref 0.6–1.1)
EGFR: 75 mL/min/{1.73_m2} — AB (ref >90)
Glucose: 98 mg/dl (ref 70–140)
Potassium: 4.3 mEq/L (ref 3.5–5.1)
Sodium: 140 mEq/L (ref 136–145)
TOTAL PROTEIN: 7.1 g/dL (ref 6.4–8.3)

## 2016-10-08 LAB — CBC WITH DIFFERENTIAL/PLATELET
BASO%: 0.3 % (ref 0.0–2.0)
BASOS ABS: 0 10*3/uL (ref 0.0–0.1)
EOS ABS: 0.1 10*3/uL (ref 0.0–0.5)
EOS%: 0.8 % (ref 0.0–7.0)
HCT: 40.1 % (ref 34.8–46.6)
HEMOGLOBIN: 13.4 g/dL (ref 11.6–15.9)
LYMPH%: 22.7 % (ref 14.0–49.7)
MCH: 29.3 pg (ref 25.1–34.0)
MCHC: 33.3 g/dL (ref 31.5–36.0)
MCV: 87.9 fL (ref 79.5–101.0)
MONO#: 0.7 10*3/uL (ref 0.1–0.9)
MONO%: 6.9 % (ref 0.0–14.0)
NEUT%: 69.3 % (ref 38.4–76.8)
NEUTROS ABS: 6.7 10*3/uL — AB (ref 1.5–6.5)
PLATELETS: 338 10*3/uL (ref 145–400)
RBC: 4.57 10*6/uL (ref 3.70–5.45)
RDW: 13.4 % (ref 11.2–14.5)
WBC: 9.7 10*3/uL (ref 3.9–10.3)
lymph#: 2.2 10*3/uL (ref 0.9–3.3)

## 2016-10-08 NOTE — Progress Notes (Signed)
[facility]  Telephone:(336) [phone] Fax:(336) [phone]     ID: TESIA LYBRAND DOB: 1946/01/28  MR#: [id number]  DQQ#:[id number]  Patient Care Team: [name], MD as PCP - General (Family Medicine) [name], MD as Consulting Physician (General Surgery) Chauncey Cruel, MD as Consulting Physician (Oncology) [name], MD as Attending Physician (Radiation Oncology) [name], NP as Nurse Practitioner (Hematology and Oncology) PCP: [name], MD GYN: OTHER MD:  CHIEF COMPLAINT:  Estrogen receptor positive breast cancer  CURRENT TREATMENT:  Observation   BREAST CANCER HISTORY: From the original intake note:   [name] had bilateral screening mammography at the breast Center 05/26/2014 showing a possible mass in the left breast. Left digital mammography with ultrasonography 06/14/2014 found a breast density to be category C. There was no persistent mass or distortion or malignant-type microcalcifications. . There was no palpable mass. Ultrasound showed a well circumscribed benign appearing nodule in the left breast 2:00 position measuring 4 mm. This was felt to be most likely benign but repeat ultrasound  At 6 months was recommended and thiswas performed 12/14/2014.  The mass was unchanged an incidental note was made of a benign appearing 0.5 cm lymph node in the left breast 2:00 position.   On 06/15/2015 [name] had bilateral diagnostic mammography with tomosynthesis and this time an irregular mass with spiculated margins was noted in the outer lower left breast measuring approximately 9 mm. Physical examination confirmed a firm fixed nodule at the 3:30 o'clock position an ultrasound of this area confirmed an irregular hypoechoic mass measuring 0.7 cm. There was a 0.2 cm adjacent satellite nodule. There was no left axillary lymphadenopathy.   biopsy of this mass was obtained 06/15/2015, and showed ([id number]) an  invasive ductal carcinoma, grade 1, estrogen receptor 100% positive, progesterone receptor 40% positive, both with strong staining intensity, with an MIB-1 of 11%, and no HER-2 amplification, the signals ratio being 1.39 and the number per cell 2.15.   Stepheni also has a history of prior tobacco abuse, status post right upper lobectomy for lung cancer, with significant emphysema.   The patient's subsequent history is as detailed below  INTERVAL HISTORY: Shinika returns today for follow-up of her estrogen receptor positive breast cancer. Interval history is generally unremarkable. She is under observation alone. She exercises by walking. In general she says "everything is going well".  REVIEW OF SYSTEMS: Krystle is planning to retire next week. She already has a trip to [place] planned and then a trip to the beach. Beyond that she is not sure what she does tell me she and her friend plan to take walks every day. She denies pain except for occasional "zingers" in her right breast which she knows are benign. There are infrequent. She has had mild fatigue, which she attributes to stress at work and to an episode of viral pneumonia which she had earlier this year. Aside from that a detailed review of systems today was noncontributory  PAST MEDICAL HISTORY: Past Medical History:  Diagnosis Date  . Acute respiratory failure ([place])   . Breast cancer ([place])   . Carotid stenosis, right   . Celiac disease   . Emphysema lung ([place])   . History of hiatal hernia   . Human metapneumovirus pneumonia March 2015  . Hyperlipidemia   . Hypertension   . Lung cancer ([place]) 2005   Rt upper lobe  . Stroke [facility])    TIA w/o defecits    PAST SURGICAL  HISTORY: Past Surgical History:  Procedure Laterality Date  . BREAST LUMPECTOMY WITH RADIOACTIVE SEED AND SENTINEL LYMPH NODE BIOPSY Left 07/17/2015   Procedure: LEFT BREAST LUMPECTOMY WITH RADIOACTIVE SEED WITH LEFT AXILLARY SENTINEL LYMPH NODE BIOPSY;  Surgeon: [name], MD;  Location: [facility];  Service: General;  Laterality: Left;  . LUNG REMOVAL, PARTIAL Right 2005   Upper lobe  . TRIGGER FINGER RELEASE    . TUBAL LIGATION  1980    FAMILY HISTORY Family History  Problem Relation Age of Onset  . Heart disease Father   . High blood pressure Father   . Congestive Heart Failure Father   . Leukemia Mother   . Colon polyps Mother   . High blood pressure Brother   . Lung cancer Brother   . Heart disease Brother   . Diabetes Brother   . Colon polyps Sister   . Breast cancer Sister   . High blood pressure Sister   . Emphysema Sister   . Pancreatic cancer Paternal Uncle    the patient's father died at the age of 66 with congestive 40 failure. Patient's mother died with acute leukemia at the age of 40. The patient had one brother, 2 sisters. One sister had breast cancer at age 75. One brother, smoker, was diagnosed with lung cancer at the age of 22. There is a paternal uncle who may have had pancreatic cancer. There is no history of ovarian cancer in the family.  GYNECOLOGIC HISTORY:  No LMP recorded. Patient is postmenopausal.  menarche age 41, first live birth age 10. The patient is GX P2. She stopped having periods in 1998, took hormone replacement approximately 6 months. 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  She describes herself is single. At home she lives by herself, with 3 cats. Her son Nargis Abrams lives in [place] and works as a Engineer, structural. The patient's other son died 23 years ago. Miasia has 2 grandchildren, a Geographical information systems officer who is a Conservation officer, nature at [employer] and a 54 y/o grandson looking for a job. She is a Tourist information centre manager.    ADVANCED DIRECTIVES:  In place. Her son [name] is her healthcare power of attorney. He can be reached at [place]: Social History  Substance  Use Topics  . Smoking status: Former Smoker    Packs/day: 1.00    Years: 35.00    Types: Cigarettes    Quit date: 01/29/2004  . Smokeless tobacco: Never Used  . Alcohol use 0.0 oz/week     Colonoscopy:  PAP:  Bone density: remote.   Lipid panel:  Allergies  Allergen Reactions  . Aspirin Other (See Comments)    "burning stomach"  . Codeine Nausea Only  . Vitamin D Analogs Other (See Comments)    Current Outpatient Prescriptions  Medication Sig Dispense Refill  . albuterol (PROVENTIL HFA;VENTOLIN HFA) 108 (90 BASE) MCG/ACT inhaler Inhale 2 puffs into the lungs every 6 (six) hours as needed for wheezing or shortness of breath. 1 Inhaler 3  . diphenhydrAMINE (BENADRYL) 25 MG tablet Take 25 mg by mouth every 8 (eight) hours as needed.    Marland Kitchen guaiFENesin (MUCINEX) 600 MG 12 hr tablet Take by mouth 2 (two) times daily.    Marland Kitchen ibuprofen (ADVIL,MOTRIN) 200 MG tablet Take 200 mg by mouth every 6 (six) hours as needed.    Marland Kitchen  mometasone-formoterol (DULERA) 200-5 MCG/ACT AERO Inhale 2 puffs into the lungs 2 (two) times daily. 1 Inhaler 5  . Multiple Vitamin (MULTIVITAMIN WITH MINERALS) TABS tablet Take 1 tablet by mouth daily.    Marland Kitchen telmisartan (MICARDIS) 40 MG tablet Take 40 mg by mouth daily.     No current facility-administered medications for this visit.     OBJECTIVE:  Middle-aged white womanIn no acute distress  Vitals:   10/08/16 1207  BP: (!) 152/85  Pulse: 84  Resp: (!) 79  Temp: 97.9 F (36.6 C)     Body mass index is 21.47 kg/m.    ECOG FS:0 - Asymptomatic  Sclerae unicteric, EOMs intact Oropharynx clear and moist No cervical or supraclavicular adenopathy Lungs no rales or rhonchi Heart regular rate and rhythm Abd soft, nontender, positive bowel sounds MSK no focal spinal tenderness, no upper extremity lymphedema Neuro: nonfocal, well oriented, appropriate affect Breasts: The right breast is benign. The left breast is status post lumpectomy and radiation. There is no  evidence of local recurrence. Both axillae are benign.  LAB RESULTS:  CMP     Component Value Date/Time   NA 139 06/27/2015 0831   K 4.6 06/27/2015 0831   CL 101 08/30/2013 0345   CO2 26 06/27/2015 0831   GLUCOSE 83 06/27/2015 0831   BUN 15.2 06/27/2015 0831   CREATININE 0.8 06/27/2015 0831   CALCIUM 9.7 06/27/2015 0831   PROT 7.3 06/27/2015 0831   ALBUMIN 3.7 06/27/2015 0831   AST 23 06/27/2015 0831   ALT 19 06/27/2015 0831   ALKPHOS 81 06/27/2015 0831   BILITOT 0.33 06/27/2015 0831   GFRNONAA >90 09/04/2013 0347   GFRAA >90 09/04/2013 0347    INo results found for: SPEP, UPEP  Lab Results  Component Value Date   WBC 9.7 10/08/2016   NEUTROABS 6.7 (H) 10/08/2016   HGB 13.4 10/08/2016   HCT 40.1 10/08/2016   MCV 87.9 10/08/2016   PLT 338 10/08/2016      Chemistry      Component Value Date/Time   NA 139 06/27/2015 0831   K 4.6 06/27/2015 0831   CL 101 08/30/2013 0345   CO2 26 06/27/2015 0831   BUN 15.2 06/27/2015 0831   CREATININE 0.8 06/27/2015 0831      Component Value Date/Time   CALCIUM 9.7 06/27/2015 0831   ALKPHOS 81 06/27/2015 0831   AST 23 06/27/2015 0831   ALT 19 06/27/2015 0831   BILITOT 0.33 06/27/2015 0831       No results found for: LABCA2  No components found for: LABCA125  No results for input(s): INR in the last 168 hours.  Urinalysis No results found for: COLORURINE, APPEARANCEUR, LABSPEC, PHURINE, GLUCOSEU, HGBUR, BILIRUBINUR, KETONESUR, PROTEINUR, UROBILINOGEN, NITRITE, LEUKOCYTESUR  STUDIES: No results found.  ASSESSMENT: 71 y.o.  [place] woman status post left breast lower outer quadrant biopsy 06/15/2015 for a clinical mT1c N0, stage IA  Invasive ductal carcinoma, grade 1, estrogen and progesterone receptor positive, HER-2 not amplified, with an MIB-1 of 11%  (1) status post left lumpectomy with sentinel lymph node sampling 07/17/2015 for a pT1b pN0, stage IA invasive ductal carcinoma, grade 2, with negative though close  margins; repeat HER-2 was again negative, and estrogen and progesterone receptors were again positive  (2) Oncotype DX score of 16 predicts an outside the breast risk of recurrence within 10 years of 10% if the patient's only systemic therapy is tamoxifen for 5 years. It also her next no benefit from chemotherapy  (  3)  The patient opted against adjuvant radiation  (4)  Anastrozole started 09/07/2015 discontinued May 2017 with intolerance  (a) exemestane started June 2017, discontinued August 2017 with intolerance.  (b) bone density at the [facility] 11/05/2015 shows a T score of -2.0   (5) Factor V Leiden positive  (6) s/p right upper lobectomy after chemo-radiation August  2005 for a primary lung cancer  PLAN: Erla is now a little over a year out from definitive surgery for her breast cancer with no evidence of the recurrence. This is very favorable  She was unable to tolerate anti-estrogens and to be entirely honest she was not all that motivated. We are continuing observation alone  She is behind on her mammography. She says no one called her or reminded her. I have gone ahead and placed the order for her to have her mammograms next week and suggested she go ahead and get the "3-D" with it.  Otherwise she is can it be retiring so I gave her information on the [place] program and the trails to recovery program.  She will see me again in one year. She knows to call for any problems that may develop before that visit.   :Chauncey Cruel, MD   10/08/2016 12:28 PM Medical Oncology and Hematology [facility] [place], [place] [address] Tel. [phone]    Fax. [phone]

## 2016-11-13 ENCOUNTER — Encounter (HOSPITAL_COMMUNITY): Payer: Self-pay

## 2016-11-13 ENCOUNTER — Emergency Department (HOSPITAL_COMMUNITY)
Admission: EM | Admit: 2016-11-13 | Discharge: 2016-11-13 | Disposition: A | Discharge status: Home / Self Care | Payer: 59 | Attending: Emergency Medicine | Admitting: Emergency Medicine

## 2016-11-13 ENCOUNTER — Emergency Department (HOSPITAL_COMMUNITY): Payer: 59

## 2016-11-13 DIAGNOSIS — Z853 Personal history of malignant neoplasm of breast: Secondary | ICD-10-CM | POA: Insufficient documentation

## 2016-11-13 DIAGNOSIS — R0602 Shortness of breath: Secondary | ICD-10-CM | POA: Diagnosis present

## 2016-11-13 DIAGNOSIS — Z8673 Personal history of transient ischemic attack (TIA), and cerebral infarction without residual deficits: Secondary | ICD-10-CM | POA: Insufficient documentation

## 2016-11-13 DIAGNOSIS — R059 Cough, unspecified: Secondary | ICD-10-CM

## 2016-11-13 DIAGNOSIS — Z87891 Personal history of nicotine dependence: Secondary | ICD-10-CM | POA: Insufficient documentation

## 2016-11-13 DIAGNOSIS — J441 Chronic obstructive pulmonary disease with (acute) exacerbation: Secondary | ICD-10-CM | POA: Insufficient documentation

## 2016-11-13 DIAGNOSIS — Z79899 Other long term (current) drug therapy: Secondary | ICD-10-CM | POA: Diagnosis not present

## 2016-11-13 DIAGNOSIS — R05 Cough: Secondary | ICD-10-CM

## 2016-11-13 DIAGNOSIS — I10 Essential (primary) hypertension: Secondary | ICD-10-CM | POA: Diagnosis not present

## 2016-11-13 DIAGNOSIS — Z85118 Personal history of other malignant neoplasm of bronchus and lung: Secondary | ICD-10-CM | POA: Insufficient documentation

## 2016-11-13 LAB — BASIC METABOLIC PANEL
Anion gap: 9 (ref 5–15)
BUN: 12 mg/dL (ref 6–20)
CALCIUM: 9.6 mg/dL (ref 8.9–10.3)
CO2: 25 mmol/L (ref 22–32)
CREATININE: 0.85 mg/dL (ref 0.44–1.00)
Chloride: 105 mmol/L (ref 101–111)
GFR calc Af Amer: >60 mL/min (ref >60)
GFR calc non Af Amer: >60 mL/min (ref >60)
Glucose, Bld: 126 mg/dL — ABNORMAL HIGH (ref 65–99)
Potassium: 4.3 mmol/L (ref 3.5–5.1)
Sodium: 139 mmol/L (ref 135–145)

## 2016-11-13 LAB — CBC
HCT: 41.9 % (ref 36.0–46.0)
Hemoglobin: 13.9 g/dL (ref 12.0–15.0)
MCH: 28.7 pg (ref 26.0–34.0)
MCHC: 33.2 g/dL (ref 30.0–36.0)
MCV: 86.4 fL (ref 78.0–100.0)
PLATELETS: 297 10*3/uL (ref 150–400)
RBC: 4.85 MIL/uL (ref 3.87–5.11)
RDW: 13.9 % (ref 11.5–15.5)
WBC: 13.1 10*3/uL — ABNORMAL HIGH (ref 4.0–10.5)

## 2016-11-13 MED ORDER — METHYLPREDNISOLONE 4 MG PO TBPK: ORAL_TABLET | ORAL | 0 refills | Status: DC

## 2016-11-13 MED ORDER — PREDNISONE 20 MG PO TABS
60.0000 mg | ORAL_TABLET | Freq: Once | ORAL | Status: DC
  Filled 2016-11-13: qty 3

## 2016-11-13 MED ORDER — IPRATROPIUM-ALBUTEROL 0.5-2.5 (3) MG/3ML IN SOLN
3.0000 mL | Freq: Once | RESPIRATORY_TRACT | Status: AC
  Administered 2016-11-13: 3 mL via RESPIRATORY_TRACT
  Filled 2016-11-13: qty 3

## 2016-11-13 MED ORDER — AZITHROMYCIN 250 MG PO TABS
500.0000 mg | ORAL_TABLET | Freq: Once | ORAL | Status: DC
Start: 2016-11-13 — End: 2016-11-13
  Filled 2016-11-13: qty 2

## 2016-11-13 MED ORDER — AZITHROMYCIN 250 MG PO TABS: ORAL_TABLET | ORAL | 0 refills | Status: DC

## 2016-11-13 MED ORDER — ALBUTEROL SULFATE (2.5 MG/3ML) 0.083% IN NEBU
5.0000 mg | INHALATION_SOLUTION | Freq: Once | RESPIRATORY_TRACT | Status: AC
  Administered 2016-11-13: 5 mg via RESPIRATORY_TRACT
  Filled 2016-11-13: qty 6

## 2016-11-13 NOTE — ED Provider Notes (Signed)
[facility] DEPT Provider Note   CSN: [id number] Arrival date & time: 11/13/16  4098     History   Chief Complaint Chief Complaint  Patient presents with  . Shortness of Breath  . Cough    HPI Jessica Gilbert is a 71 y.o. female with a history of emphysema, Lung CA, active breast cancer, factor V Leiden who presents with a month of cough.  She reports that when he cough started she had "a nasty cold" that got better but the cough lingered.  She reports that she initially saw her PCP and was given antibiotics but did not get better.  She then was re-evaluated, had chest x-rays and has not gotten better.   She reports that today she is having increased difficulty breathing.   Her symptoms are made worse with exertion, slightly better with rest but still not at her base line.   She denies travel out of the [place], no known sick contacts, feels like she can not walk as far as normal.  She denies trauma, is not on any blood thinners.  Said she had a fever a few weeks ago when this all started and night sweats then.  No unexpected weight loss.  She reports her oxygen is normally 93-95% on room air.    HPI  Past Medical History:  Diagnosis Date  . Acute respiratory failure ([place])   . Breast cancer ([name])   . Carotid stenosis, right   . Celiac disease   . Emphysema lung ([place])   . History of hiatal hernia   . Human metapneumovirus pneumonia March 2015  . Hyperlipidemia   . Hypertension   . Lung cancer ([place]) 2005   Rt upper lobe  . Stroke [facility])    TIA w/o defecits    Patient Active Problem List   Diagnosis Date Noted  . Primary cancer of right upper lobe of lung ([place]) 06/27/2015  . Malignant neoplasm of lower-outer quadrant of left breast of female, estrogen receptor positive ([place]) 06/21/2015  . PNA (pneumonia) 02/27/2014  . COPD with emphysema ([place]) 10/10/2013  . HTN (hypertension) 08/28/2013    Past Surgical History:  Procedure Laterality Date  . BREAST LUMPECTOMY WITH  RADIOACTIVE SEED AND SENTINEL LYMPH NODE BIOPSY Left 07/17/2015   Procedure: LEFT BREAST LUMPECTOMY WITH RADIOACTIVE SEED WITH LEFT AXILLARY SENTINEL LYMPH NODE BIOPSY;  Surgeon: [name], MD;  Location: [facility];  Service: General;  Laterality: Left;  . LUNG REMOVAL, PARTIAL Right 2005   Upper lobe  . TRIGGER FINGER RELEASE    . TUBAL LIGATION  1980    OB History    No data available       Home Medications    Prior to Admission medications   Medication Sig Start Date End Date Taking? Authorizing Provider  albuterol (PROVENTIL HFA;VENTOLIN HFA) 108 (90 BASE) MCG/ACT inhaler Inhale 2 puffs into the lungs every 6 (six) hours as needed for wheezing or shortness of breath. 09/04/13   Buriev, Arie Sabina, MD  azithromycin (ZITHROMAX) 250 MG tablet Take two tablets (56m) on day one then take one tablet a day until gone. 11/13/16   HLorin Glass PA-C  diphenhydrAMINE (BENADRYL) 25 MG tablet Take 25 mg by mouth every 8 (eight) hours as needed.    [provider]  guaiFENesin (MUCINEX) 600 MG 12 hr tablet Take by mouth 2 (two) times daily.    [provider]  ibuprofen (ADVIL,MOTRIN) 200 MG tablet Take 200 mg by mouth every  6 (six) hours as needed.    [provider]  methylPREDNISolone (MEDROL DOSEPAK) 4 MG TBPK tablet Take all tablets at breakfast with food. Day one: take 6 tablets Day two: take 5 tablets Day three: take 4 tablets Day four:take 3 tablets Day five: take two tablets Day six: take one tablet 11/13/16   Lorin Glass, PA-C  mometasone-formoterol [facility]) 200-5 MCG/ACT AERO Inhale 2 puffs into the lungs 2 (two) times daily. 05/15/14   Parrett, Fonnie Mu, NP  Multiple Vitamin (MULTIVITAMIN WITH MINERALS) TABS tablet Take 1 tablet by mouth daily.    [provider]  telmisartan (MICARDIS) 40 MG tablet Take 40 mg by mouth daily.    [provider]    Family History Family History  Problem Relation Age of  Onset  . Heart disease Father   . High blood pressure Father   . Congestive Heart Failure Father   . Leukemia Mother   . Colon polyps Mother   . High blood pressure Brother   . Lung cancer Brother   . Heart disease Brother   . Diabetes Brother   . Colon polyps Sister   . Breast cancer Sister   . High blood pressure Sister   . Emphysema Sister   . Pancreatic cancer Paternal Uncle     Social History Social History  Substance Use Topics  . Smoking status: Former Smoker    Packs/day: 1.00    Years: 35.00    Types: Cigarettes    Quit date: 01/29/2004  . Smokeless tobacco: Never Used  . Alcohol use 0.0 oz/week     Comment: wine occasionally     Allergies   Aspirin; Codeine; and Vitamin d analogs   Review of Systems Review of Systems  Constitutional: Negative for chills, diaphoresis, fever and unexpected weight change.  HENT: Negative for congestion, ear pain, nosebleeds, postnasal drip, rhinorrhea and sore throat.   Eyes: Negative for pain and visual disturbance.  Respiratory: Positive for cough, shortness of breath and wheezing. Negative for choking and chest tightness.   Cardiovascular: Negative for chest pain and palpitations.  Gastrointestinal: Negative for abdominal pain, nausea and vomiting.  Genitourinary: Negative for dysuria and hematuria.  Musculoskeletal: Negative for arthralgias and back pain.  Skin: Negative for color change.  Neurological: Negative for seizures and syncope.  All other systems reviewed and are negative.    Physical Exam Updated Vital Signs BP (!) 116/54 (BP Location: Left Arm)   Pulse 87   Temp 97.7 F (36.5 C) (Oral)   Resp 14   Ht 5' 4"  (1.626 m)   Wt 56.7 kg (125 lb)   SpO2 97%   BMI 21.46 kg/m   Physical Exam  Constitutional: She appears well-developed and well-nourished. No distress.  HENT:  Head: Normocephalic and atraumatic.  Eyes: Conjunctivae are normal. Right eye exhibits no discharge. Left eye exhibits no discharge.  No scleral icterus.  Neck: Normal range of motion.  Cardiovascular: Normal rate and regular rhythm.   No murmur heard. Pulmonary/Chest: No stridor. No respiratory distress (Slight increase in effort with out overt respiratory distress). She has decreased breath sounds in the right upper field. She has wheezes in the right middle field, the right lower field, the left upper field, the left middle field and the left lower field. She has rhonchi in the right middle field, the right lower field, the left middle field and the left lower field. She has no rales.  Abdominal: Soft. Bowel sounds are normal. She exhibits  no distension. There is no tenderness.  Musculoskeletal: She exhibits no edema or deformity.  Neurological: She is alert. No sensory deficit. She exhibits normal muscle tone. Coordination normal.  Skin: Skin is warm and dry. She is not diaphoretic.  Psychiatric: She has a normal mood and affect. Her behavior is normal.  Nursing note and vitals reviewed.   After neb treatment lung sounds improved, less wheezes, rhonchi remained.  ED Treatments / Results  Labs (all labs ordered are listed, but only abnormal results are displayed) Labs Reviewed  BASIC METABOLIC PANEL - Abnormal; Notable for the following:       Result Value   Glucose, Bld 126 (*)    All other components within normal limits  CBC - Abnormal; Notable for the following:    WBC 13.1 (*)    All other components within normal limits    EKG  EKG Interpretation  Date/Time:  Thursday Nov 13 2016 09:51:16 EDT Ventricular Rate:  89 PR Interval:    QRS Duration: 83 QT Interval:  347 QTC Calculation: 423 R Axis:   94 Text Interpretation:  Sinus rhythm Nonspecific T wave abnormality Confirmed by Ashok Cordia  MD, Lennette Bihari ([id number]) on 11/13/2016 11:41:19 AM       Radiology Dg Chest 2 View  Result Date: 11/13/2016 CLINICAL DATA:  Cough, chest congestion, shortness of breath, an expiratory wheezing for the past month. History of  COPD and right upper lobe lung malignancy. EXAM: CHEST  2 VIEW COMPARISON:  Chest x-ray of Oct 27, 2016 FINDINGS: The lungs remain hyperinflated. Stable postsurgical changes in the right apex are observed. There is no alveolar infiltrate or pleural effusion. The heart and pulmonary vascularity are normal. There is calcification in the wall of the aortic arch. IMPRESSION: COPD. Right apical postsurgical changes. No evidence of pneumonia, recurrent malignancy, or CHF. Thoracic aortic atherosclerosis. Electronically Signed   By: David  Martinique M.D.   On: 11/13/2016 11:46    Procedures Procedures (including critical care time)  Medications Ordered in ED Medications  albuterol (PROVENTIL) (2.5 MG/3ML) 0.083% nebulizer solution 5 mg (5 mg Nebulization Given 11/13/16 1013)  ipratropium-albuterol (DUONEB) 0.5-2.5 (3) MG/3ML nebulizer solution 3 mL (3 mLs Nebulization Given 11/13/16 1159)     Initial Impression / Assessment and Plan / ED Course  I have reviewed the triage vital signs and the nursing notes.  Pertinent labs & imaging results that were available during my care of the patient were reviewed by me and considered in my medical decision making (see chart for details).    Enis Slipper presents with cough, increased shortness of breath, and increased sputum volume consistent with acute exacerbation of COPD.  She had a chest x-ray obtained with no change from her baseline.  Will treat with z pack and medrol dose pack.  She was instructed to follow up with her primary care doctor and, consider a referral to a pulmonologist for management of her chronic lung disease.  She has albuterol inhaler at home, vitals stable in the ED.  Patient and her son were given strict return precautions and stated her understanding.  Patient was given the option to ask questions, all of which were answered to the best of my ability. At time of discharge patient is in no distress, breathing is non labored and patient  appears comfortable.    At this time there does not appear to be any evidence of an acute emergency medical condition and the patient appears stable for discharge with appropriate outpatient follow  up.Diagnosis was discussed with patient who verbalizes understanding and is agreeable to discharge. Pt case discussed with Dr. Ashok Cordia  who agrees with my plan and evaluated the patient.     Final Clinical Impressions(s) / ED Diagnoses   Final diagnoses:  Cough  Shortness of breath  COPD exacerbation ([place])    New Prescriptions Discharge Medication List as of 11/13/2016 12:57 PM    START taking these medications   Details  azithromycin (ZITHROMAX) 250 MG tablet Take two tablets (593m) on day one then take one tablet a day until gone., Print    methylPREDNISolone (MEDROL DOSEPAK) 4 MG TBPK tablet Take all tablets at breakfast with food. Day one: take 6 tablets Day two: take 5 tablets Day three: take 4 tablets Day four:take 3 tablets Day five: take two tablets Day six: take one tablet, Print         HLorin Glass PA-C 11/13/16 1528    SLajean Saver MD 11/15/16 0930

## 2016-11-13 NOTE — ED Triage Notes (Signed)
Patient reports a productive cough with creamy white sputum, SOB, and expiratory wheezing x 1 month. Patient states the symptoms are not getting worse ,but are no better.

## 2016-11-13 NOTE — ED Notes (Signed)
Bed: WA14 Expected date:  Expected time:  Means of arrival:  Comments: 

## 2016-12-05 ENCOUNTER — Ambulatory Visit
Admission: RE | Admit: 2016-12-05 | Discharge: 2016-12-05 | Disposition: A | Discharge status: Home / Self Care | Payer: 59 | Source: Ambulatory Visit | Attending: Oncology | Admitting: Oncology

## 2016-12-05 ENCOUNTER — Other Ambulatory Visit: Payer: Self-pay | Admitting: Oncology

## 2016-12-05 DIAGNOSIS — N6310 Unspecified lump in the right breast, unspecified quadrant: Secondary | ICD-10-CM

## 2016-12-05 DIAGNOSIS — C3411 Malignant neoplasm of upper lobe, right bronchus or lung: Secondary | ICD-10-CM

## 2016-12-05 DIAGNOSIS — Z170 Estrogen receptor positive status [ER+]: Principal | ICD-10-CM

## 2016-12-05 DIAGNOSIS — C50512 Malignant neoplasm of lower-outer quadrant of left female breast: Secondary | ICD-10-CM

## 2017-10-17 IMAGING — MG MM DIAG BREAST TOMO BILATERAL
5 series · 5 of 5 positions shown · non-contrast
Comparison: Previous exam(s).

CLINICAL DATA: Follow-up for a probably benign left breast mass.

EXAM:
DIGITAL DIAGNOSTIC BILATERAL MAMMOGRAM WITH 3D TOMOSYNTHESIS AND CAD
LEFT BREAST ULTRASOUND

[L MLO (1 of 2)]
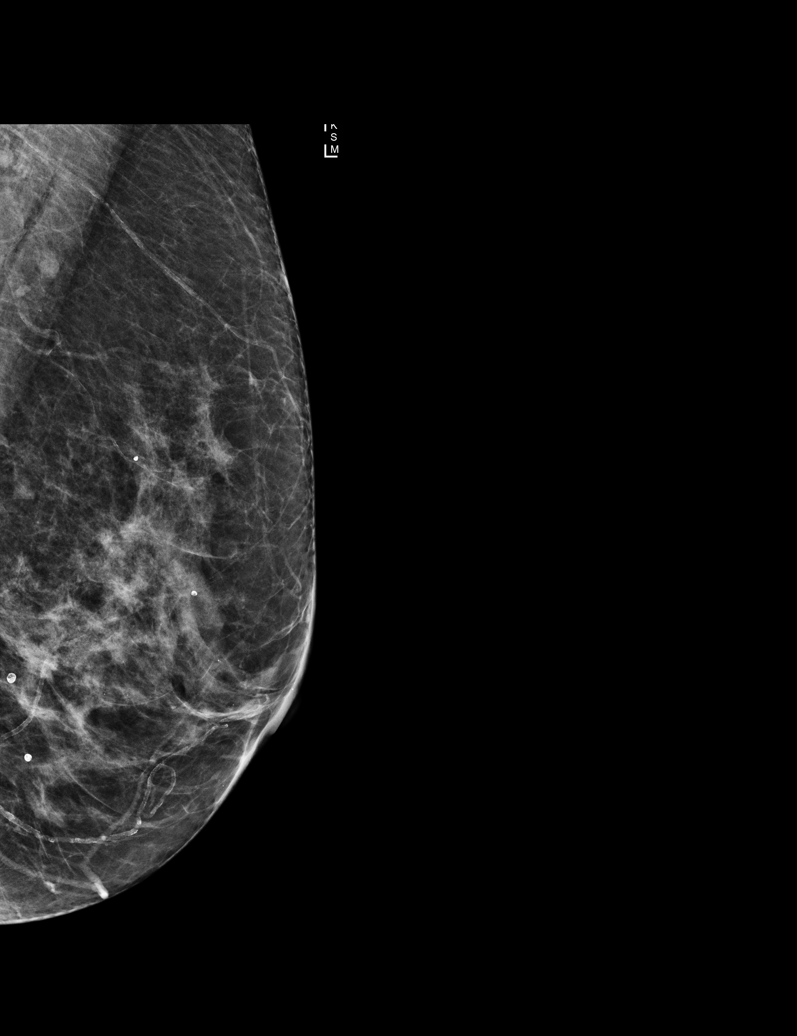

[L MLO (2 of 2)]
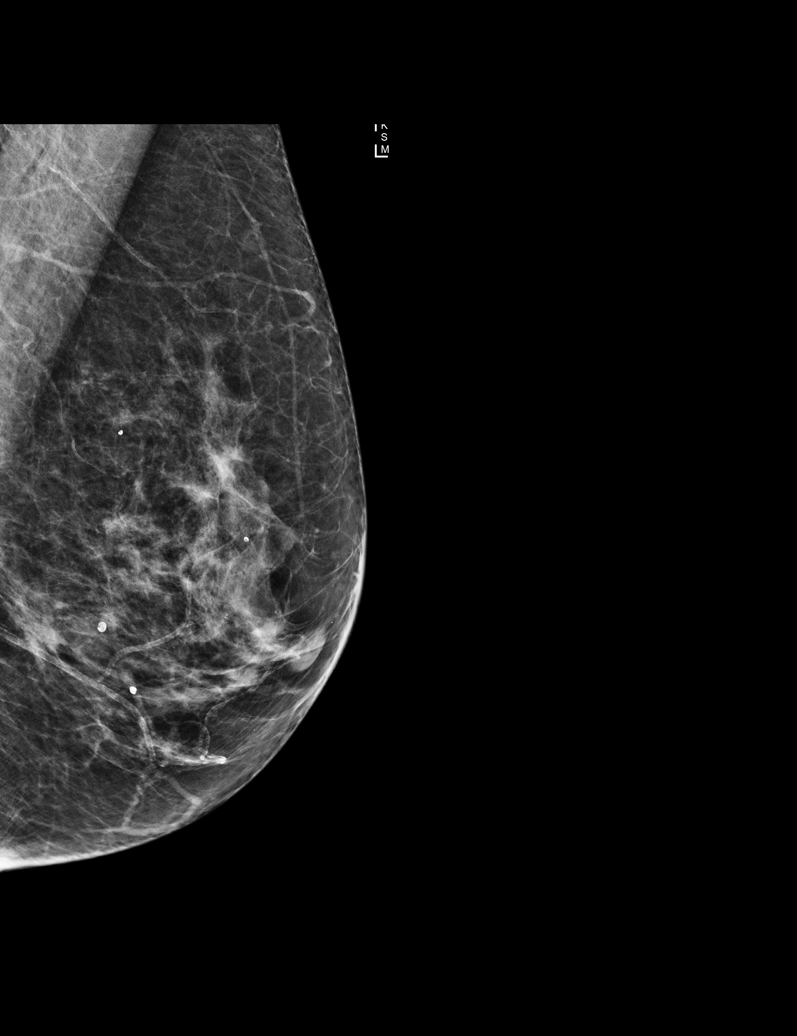

[R MLO]
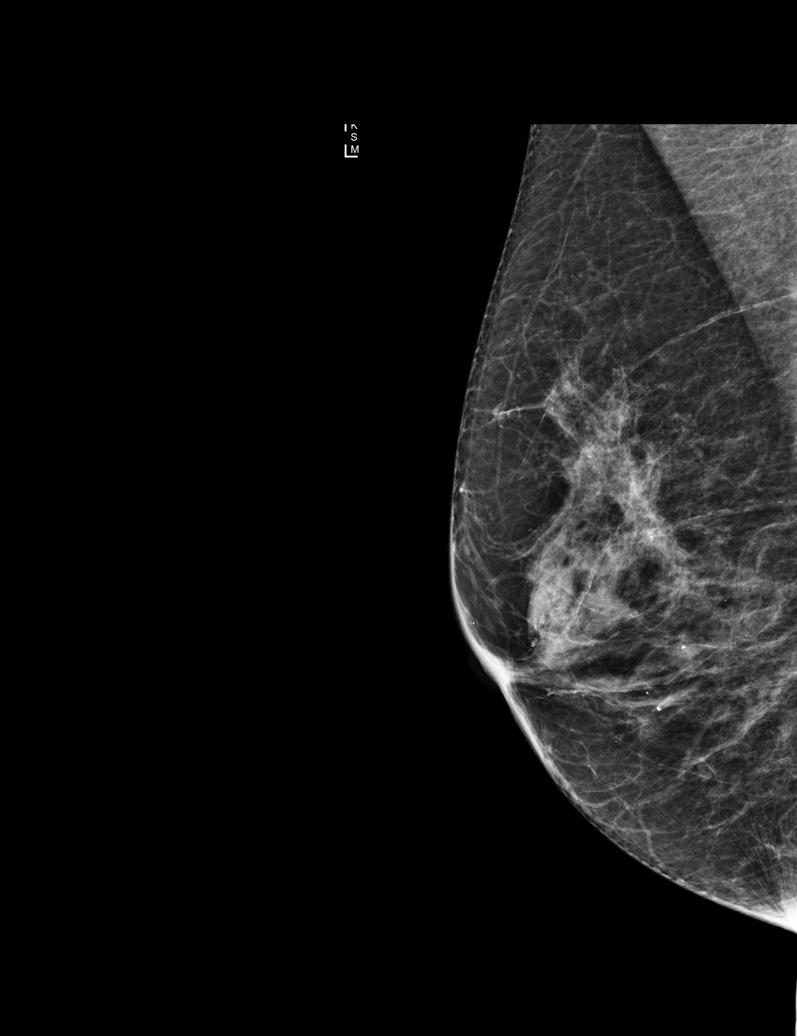

[L CC]
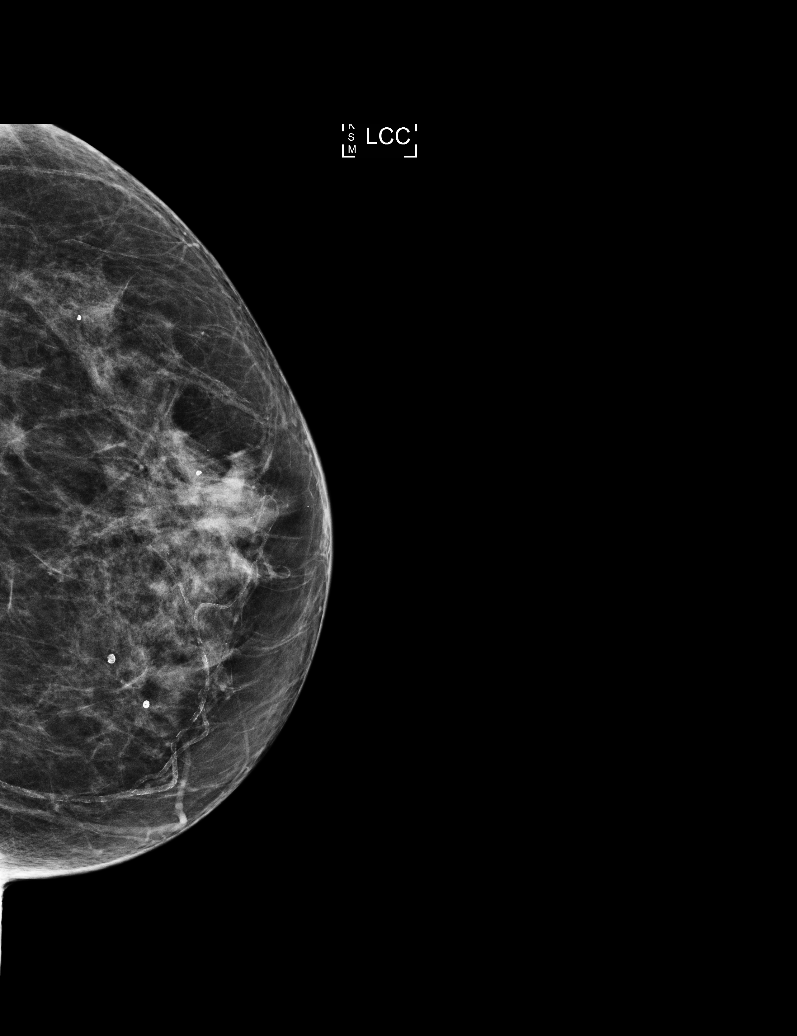

[R CC]
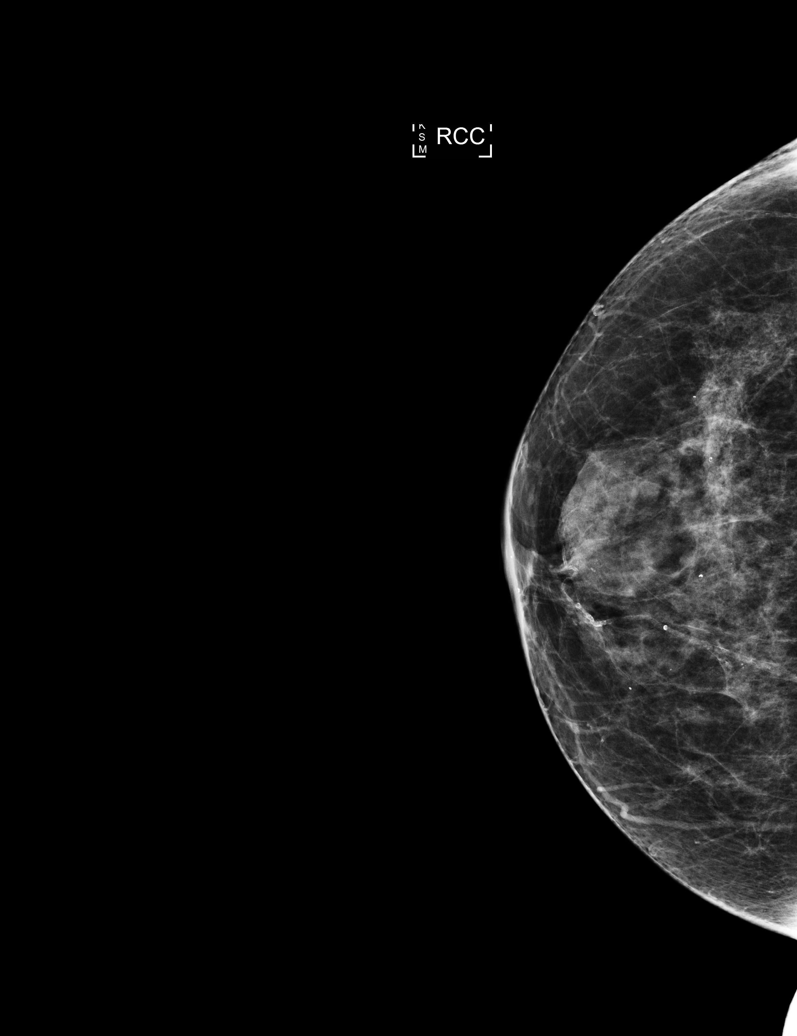

[5 of 5 positions shown; findings below may reference images not displayed]

ACR Breast Density Category c: The breast tissue is heterogeneously
dense, which may obscure small masses.
FINDINGS: No suspicious masses or calcifications are seen in the right breast.
There is an irregular mass with spiculated margins within the outer
slightly lower left breast measuring approximately 8-9 mm.

Mammographic images were processed with CAD.

Physical examination of the lower outer left breast reveals a firm
fixed nodule at the approximate 330 to 4 o'clock position.

Targeted ultrasound of the left breast was performed demonstrating
an irregular hypoechoic mass at 4 o'clock 3 cm from the nipple
measuring 0.7 x 0.5 x 0.7 cm. This corresponds with mammography
findings. A 2 mm adjacent hypoechoic nodule within 5 mm of the
dominant mass likely represents a small satellite nodule. No
lymphadenopathy seen in the left axilla.
IMPRESSION: Highly suspicious 7 mm mass in the left breast at the 4 o'clock
position with likely an adjacent 2 mm satellite nodule.

RECOMMENDATION:
Ultrasound-guided biopsy of the mass in the left breast is
recommended. This will subsequently be performed and dictated
separately.

I have discussed the findings and recommendations with the patient.
Results were also provided in writing at the conclusion of the
visit. If applicable, a reminder letter will be sent to the patient
regarding the next appointment.

BI-RADS CATEGORY  5: Highly suggestive of malignancy.

## 2017-10-17 IMAGING — MG MM DIGITAL DIAGNOSTIC UNILAT*L*
2 series · 2 of 2 positions shown · non-contrast
Comparison: Previous exam(s).

CLINICAL DATA: Post ultrasound-guided biopsy of a highly suspicious
mass in the left breast at the 4 o'clock position.

EXAM:
DIAGNOSTIC LEFT MAMMOGRAM POST ULTRASOUND BIOPSY

[L CC]
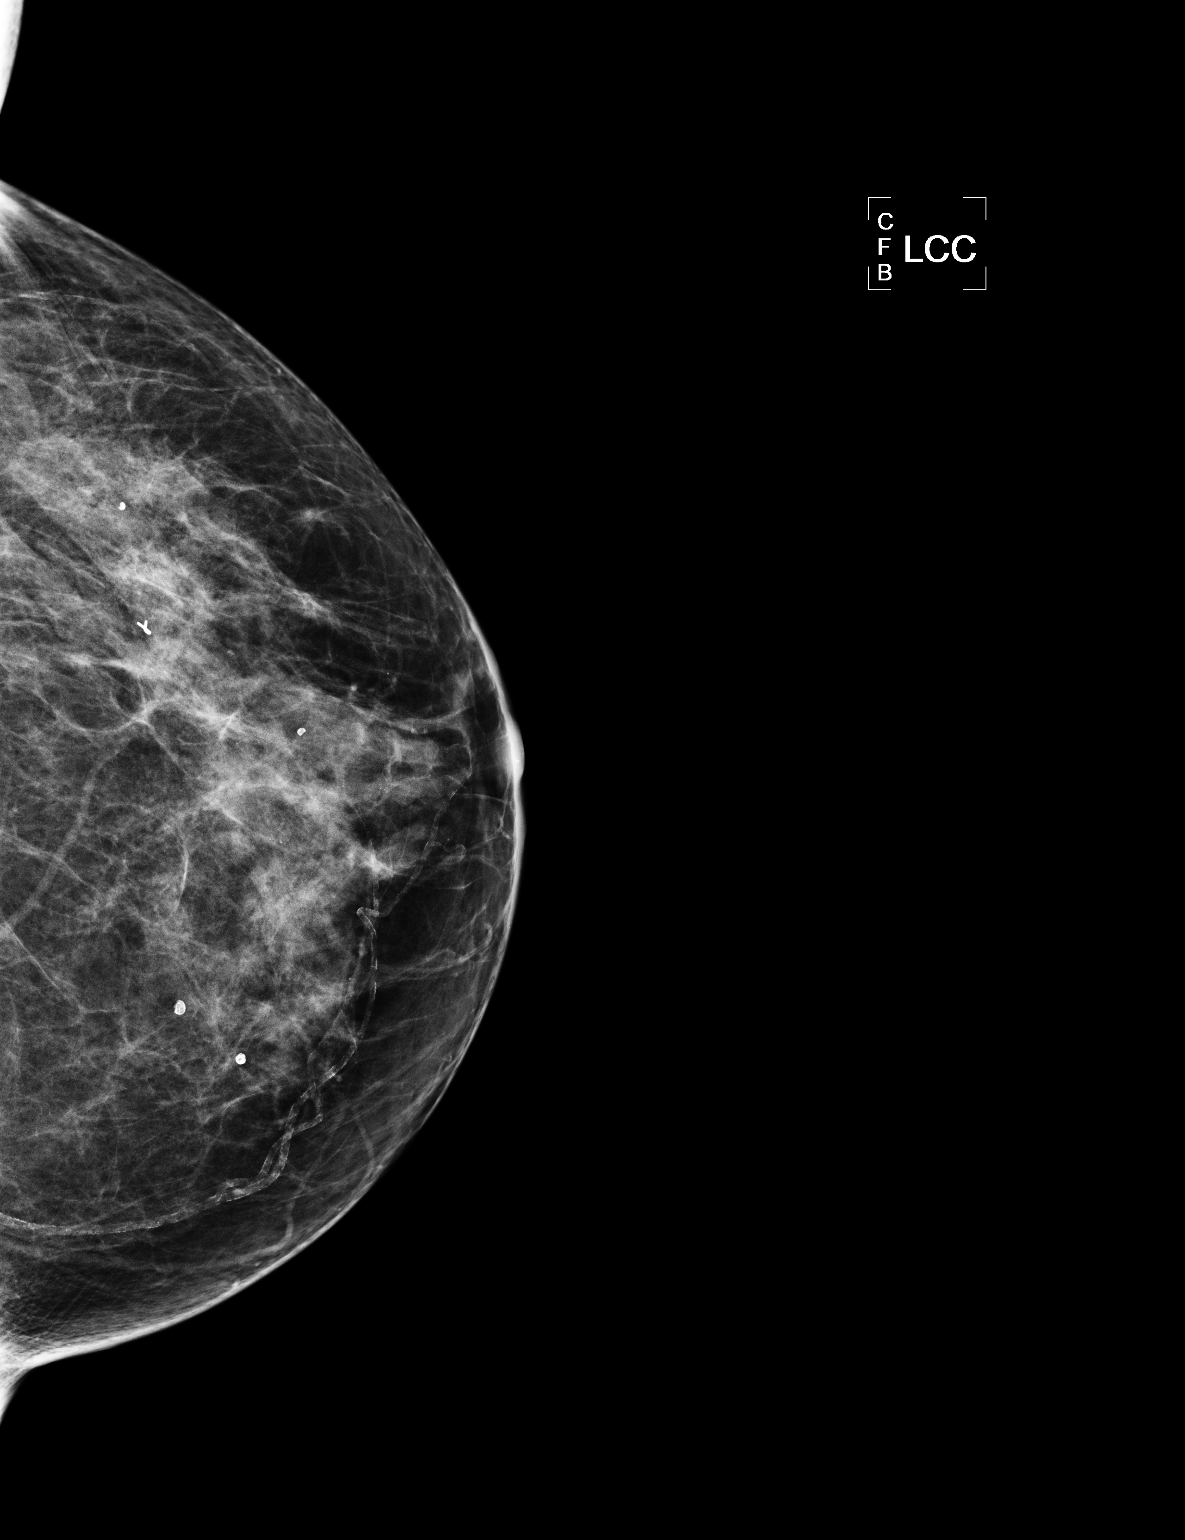

[L ML]
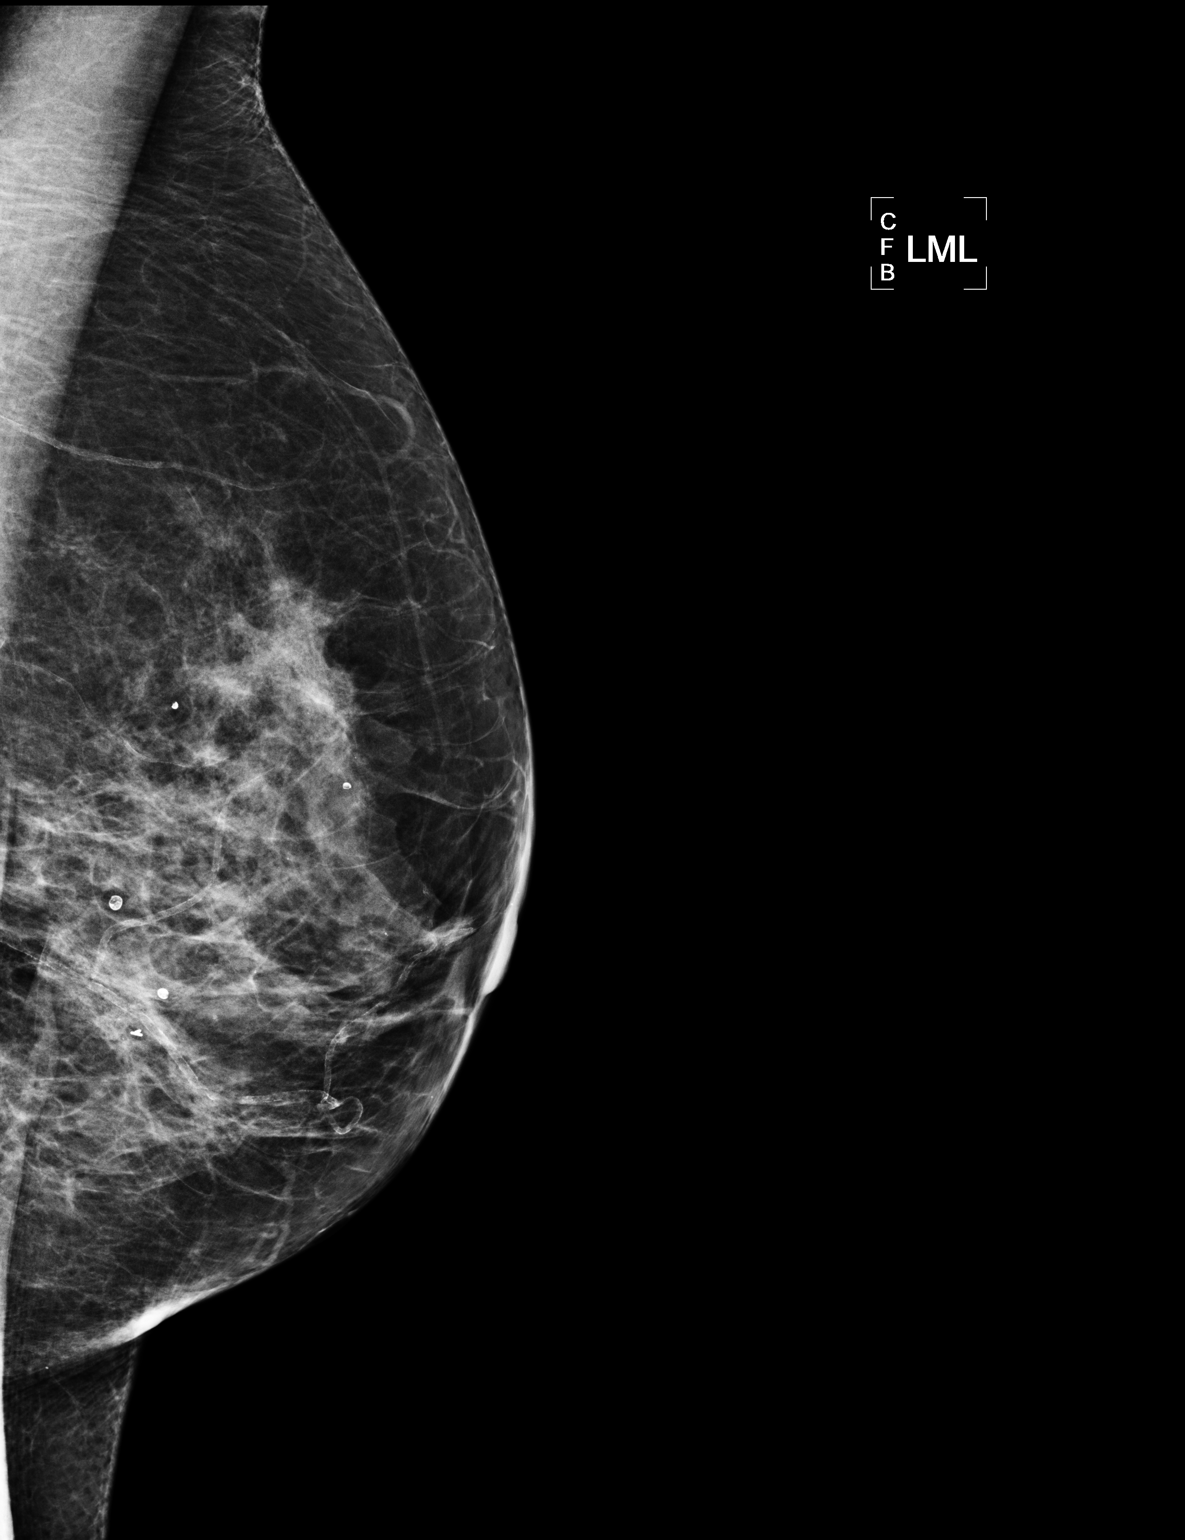

[2 of 2 positions shown; findings below may reference images not displayed]

FINDINGS: Mammographic images were obtained following ultrasound guided biopsy
of a highly suspicious mass in the left breast at the 4 o'clock
position. A ribbon shaped biopsy marking clip is present in the
targeted region of the biopsied left breast mass.
IMPRESSION: Appropriate ribbon shaped biopsy marking clip position post
ultrasound-guided biopsy of a highly suspicious mass at the 4
o'clock position.

Final Assessment: Post Procedure Mammograms for Marker Placement

## 2017-10-27 ENCOUNTER — Inpatient Hospital Stay: Payer: Medicare HMO | Attending: Oncology

## 2017-10-27 DIAGNOSIS — K449 Diaphragmatic hernia without obstruction or gangrene: Secondary | ICD-10-CM | POA: Diagnosis not present

## 2017-10-27 DIAGNOSIS — K900 Celiac disease: Secondary | ICD-10-CM | POA: Diagnosis not present

## 2017-10-27 DIAGNOSIS — Z8673 Personal history of transient ischemic attack (TIA), and cerebral infarction without residual deficits: Secondary | ICD-10-CM | POA: Diagnosis not present

## 2017-10-27 DIAGNOSIS — Z923 Personal history of irradiation: Secondary | ICD-10-CM | POA: Diagnosis not present

## 2017-10-27 DIAGNOSIS — Z801 Family history of malignant neoplasm of trachea, bronchus and lung: Secondary | ICD-10-CM | POA: Insufficient documentation

## 2017-10-27 DIAGNOSIS — R0602 Shortness of breath: Secondary | ICD-10-CM | POA: Insufficient documentation

## 2017-10-27 DIAGNOSIS — I6521 Occlusion and stenosis of right carotid artery: Secondary | ICD-10-CM | POA: Diagnosis not present

## 2017-10-27 DIAGNOSIS — Z170 Estrogen receptor positive status [ER+]: Secondary | ICD-10-CM | POA: Insufficient documentation

## 2017-10-27 DIAGNOSIS — E785 Hyperlipidemia, unspecified: Secondary | ICD-10-CM | POA: Insufficient documentation

## 2017-10-27 DIAGNOSIS — C50512 Malignant neoplasm of lower-outer quadrant of left female breast: Secondary | ICD-10-CM | POA: Insufficient documentation

## 2017-10-27 DIAGNOSIS — Z800 Family history of malignant neoplasm of digestive organs: Secondary | ICD-10-CM | POA: Diagnosis not present

## 2017-10-27 DIAGNOSIS — Z85118 Personal history of other malignant neoplasm of bronchus and lung: Secondary | ICD-10-CM | POA: Insufficient documentation

## 2017-10-27 DIAGNOSIS — Z79899 Other long term (current) drug therapy: Secondary | ICD-10-CM | POA: Insufficient documentation

## 2017-10-27 DIAGNOSIS — D6851 Activated protein C resistance: Secondary | ICD-10-CM | POA: Diagnosis not present

## 2017-10-27 DIAGNOSIS — Z87891 Personal history of nicotine dependence: Secondary | ICD-10-CM | POA: Diagnosis not present

## 2017-10-27 DIAGNOSIS — Z9221 Personal history of antineoplastic chemotherapy: Secondary | ICD-10-CM | POA: Diagnosis not present

## 2017-10-27 DIAGNOSIS — J439 Emphysema, unspecified: Secondary | ICD-10-CM | POA: Diagnosis not present

## 2017-10-27 LAB — CBC WITH DIFFERENTIAL/PLATELET
Basophils Absolute: 0 10*3/uL (ref 0.0–0.1)
Basophils Relative: 0 %
EOS ABS: 0.2 10*3/uL (ref 0.0–0.5)
EOS PCT: 2 %
HCT: 43 % (ref 34.8–46.6)
Hemoglobin: 14.1 g/dL (ref 11.6–15.9)
LYMPHS ABS: 2.6 10*3/uL (ref 0.9–3.3)
LYMPHS PCT: 29 %
MCH: 29.9 pg (ref 25.1–34.0)
MCHC: 32.8 g/dL (ref 31.5–36.0)
MCV: 91.3 fL (ref 79.5–101.0)
MONOS PCT: 9 %
Monocytes Absolute: 0.8 10*3/uL (ref 0.1–0.9)
Neutro Abs: 5.3 10*3/uL (ref 1.5–6.5)
Neutrophils Relative %: 60 %
PLATELETS: 330 10*3/uL (ref 145–400)
RBC: 4.71 MIL/uL (ref 3.70–5.45)
RDW: 13.2 % (ref 11.2–14.5)
WBC: 8.8 10*3/uL (ref 3.9–10.3)

## 2017-10-27 LAB — COMPREHENSIVE METABOLIC PANEL
ALBUMIN: 4.2 g/dL (ref 3.5–5.0)
ALK PHOS: 87 U/L (ref 40–150)
ALT: 21 U/L (ref 0–55)
AST: 28 U/L (ref 5–34)
Anion gap: 8 (ref 3–11)
BILIRUBIN TOTAL: 0.4 mg/dL (ref 0.2–1.2)
BUN: 16 mg/dL (ref 7–26)
CO2: 27 mmol/L (ref 22–29)
CREATININE: 0.96 mg/dL (ref 0.60–1.10)
Calcium: 9.8 mg/dL (ref 8.4–10.4)
Chloride: 103 mmol/L (ref 98–109)
GFR calc Af Amer: >60 mL/min (ref >60)
GFR calc non Af Amer: 58 mL/min — ABNORMAL LOW (ref >60)
GLUCOSE: 99 mg/dL (ref 70–140)
Potassium: 4.9 mmol/L (ref 3.5–5.1)
SODIUM: 138 mmol/L (ref 136–145)
Total Protein: 7.7 g/dL (ref 6.4–8.3)

## 2017-10-29 ENCOUNTER — Inpatient Hospital Stay (HOSPITAL_BASED_OUTPATIENT_CLINIC_OR_DEPARTMENT_OTHER): Payer: Medicare HMO | Admitting: Oncology

## 2017-10-29 VITALS — BP 121/74 | HR 96 | Temp 98.8°F | Resp 18 | Ht 64.0 in | Wt 140.4 lb

## 2017-10-29 DIAGNOSIS — K449 Diaphragmatic hernia without obstruction or gangrene: Secondary | ICD-10-CM | POA: Diagnosis not present

## 2017-10-29 DIAGNOSIS — Z923 Personal history of irradiation: Secondary | ICD-10-CM

## 2017-10-29 DIAGNOSIS — Z801 Family history of malignant neoplasm of trachea, bronchus and lung: Secondary | ICD-10-CM

## 2017-10-29 DIAGNOSIS — Z87891 Personal history of nicotine dependence: Secondary | ICD-10-CM

## 2017-10-29 DIAGNOSIS — C50512 Malignant neoplasm of lower-outer quadrant of left female breast: Secondary | ICD-10-CM

## 2017-10-29 DIAGNOSIS — E785 Hyperlipidemia, unspecified: Secondary | ICD-10-CM

## 2017-10-29 DIAGNOSIS — C3411 Malignant neoplasm of upper lobe, right bronchus or lung: Secondary | ICD-10-CM

## 2017-10-29 DIAGNOSIS — Z170 Estrogen receptor positive status [ER+]: Secondary | ICD-10-CM | POA: Diagnosis not present

## 2017-10-29 DIAGNOSIS — Z85118 Personal history of other malignant neoplasm of bronchus and lung: Secondary | ICD-10-CM

## 2017-10-29 DIAGNOSIS — J439 Emphysema, unspecified: Secondary | ICD-10-CM | POA: Diagnosis not present

## 2017-10-29 DIAGNOSIS — D6851 Activated protein C resistance: Secondary | ICD-10-CM | POA: Diagnosis not present

## 2017-10-29 DIAGNOSIS — K900 Celiac disease: Secondary | ICD-10-CM

## 2017-10-29 DIAGNOSIS — Z800 Family history of malignant neoplasm of digestive organs: Secondary | ICD-10-CM

## 2017-10-29 DIAGNOSIS — Z8673 Personal history of transient ischemic attack (TIA), and cerebral infarction without residual deficits: Secondary | ICD-10-CM

## 2017-10-29 DIAGNOSIS — I6521 Occlusion and stenosis of right carotid artery: Secondary | ICD-10-CM

## 2017-10-29 DIAGNOSIS — Z79899 Other long term (current) drug therapy: Secondary | ICD-10-CM | POA: Diagnosis not present

## 2017-10-29 DIAGNOSIS — R0602 Shortness of breath: Secondary | ICD-10-CM

## 2017-10-29 DIAGNOSIS — Z9221 Personal history of antineoplastic chemotherapy: Secondary | ICD-10-CM | POA: Diagnosis not present

## 2017-10-29 NOTE — Progress Notes (Signed)
[place]  Telephone:([phone]) [phone] Fax:(336) [phone]     ID: KINGSLEY HERANDEZ DOB: 1945-12-28  MR#: [id number]  TWS#:[id number]  Patient Care Team: [name], MD as PCP - General (Family Medicine) [name], MD as Consulting Physician (General Surgery) Magrinat, Virgie Dad, MD as Consulting Physician (Oncology) [name], MD as Attending Physician (Radiation Oncology) [name], NP as Nurse Practitioner (Hematology and Oncology)  CHIEF COMPLAINT:  Estrogen receptor positive breast cancer  CURRENT TREATMENT:  Observation   BREAST CANCER HISTORY: From the original intake note:   Jessica Gilbert (pronounced "ro-lund") had bilateral screening mammography at the [facility] Center 05/26/2014 showing a possible mass in the left breast. Left digital mammography with ultrasonography 06/14/2014 found a breast density to be category C. There was no persistent mass or distortion or malignant-type microcalcifications. . There was no palpable mass. Ultrasound showed a well circumscribed benign appearing nodule in the left breast 2:00 position measuring 4 mm. This was felt to be most likely benign but repeat ultrasound  At 6 months was recommended and thiswas performed 12/14/2014.  The mass was unchanged an incidental note was made of a benign appearing 0.5 cm lymph node in the left breast 2:00 position.   On 06/15/2015 Jessica Gilbert had bilateral diagnostic mammography with tomosynthesis and this time an irregular mass with spiculated margins was noted in the outer lower left breast measuring approximately 9 mm. Physical examination confirmed a firm fixed nodule at the 3:30 o'clock position an ultrasound of this area confirmed an irregular hypoechoic mass measuring 0.7 cm. There was a 0.2 cm adjacent satellite nodule. There was no left axillary lymphadenopathy.   biopsy of this mass was obtained 06/15/2015, and showed ([id number]) an invasive ductal carcinoma, grade 1,  estrogen receptor 100% positive, progesterone receptor 40% positive, both with strong staining intensity, with an MIB-1 of 11%, and no HER-2 amplification, the signals ratio being 1.39 and the number per cell 2.15.   Jessica Gilbert also has a history of prior tobacco abuse, status post right upper lobectomy for lung cancer, with significant emphysema.   The patient's subsequent history is as detailed below  INTERVAL HISTORY: Jessica Gilbert returns today for follow-up of her estrogen receptor positive breast cancer.  She continues under observation alone as she was not able to tolerate antiestrogens  She is having significant problems with shortness of breath which greatly limit her life.   REVIEW OF SYSTEMS: Jessica Gilbert is doing well overall. Since retiring she was told she has COPD, but she thinks it is asthma. [place]ia sates that her breathing has restricted her daily activities. She is unable to exercise much because of her SOB. She does not enjoying swimming. She denies unusual headaches, visual changes, nausea, vomiting, or dizziness. There has been no unusual cough, phlegm production, or pleurisy. This been no change in bowel or bladder habits. She denies unexplained fatigue or unexplained weight loss, bleeding, rash, or fever. A detailed review of systems was otherwise noncontributory.   PAST MEDICAL HISTORY: Past Medical History:  Diagnosis Date  . Acute respiratory failure ([place])   . Breast cancer ([place])   . Carotid stenosis, right   . Celiac disease   . Emphysema lung ([place])   . History of hiatal hernia   . Human metapneumovirus pneumonia March 2015  . Hyperlipidemia   . Hypertension   . Lung cancer ([place]) 2005   Rt upper lobe  . Stroke [facility])    TIA w/o defecits    PAST SURGICAL HISTORY: Past Surgical  History:  Procedure Laterality Date  . BREAST LUMPECTOMY     left   . BREAST LUMPECTOMY WITH RADIOACTIVE SEED AND SENTINEL LYMPH NODE BIOPSY Left 07/17/2015   Procedure: LEFT BREAST LUMPECTOMY WITH  RADIOACTIVE SEED WITH LEFT AXILLARY SENTINEL LYMPH NODE BIOPSY;  Surgeon: [name], MD;  Location: [facility];  Service: General;  Laterality: Left;  . LUNG REMOVAL, PARTIAL Right 2005   Upper lobe  . TRIGGER FINGER RELEASE    . TUBAL LIGATION  1980    FAMILY HISTORY Family History  Problem Relation Age of Onset  . Heart disease Father   . High blood pressure Father   . Congestive Heart Failure Father   . Leukemia Mother   . Colon polyps Mother   . High blood pressure Brother   . Lung cancer Brother   . Heart disease Brother   . Diabetes Brother   . Colon polyps Sister   . Breast cancer Sister   . High blood pressure Sister   . Emphysema Sister   . Pancreatic cancer Paternal Uncle    the patient's father died at the age of 62 with congestive 40 failure. Patient's mother died with acute leukemia at the age of 21. The patient had one brother, 2 sisters. One sister had breast cancer at age 28. One brother, smoker, was diagnosed with lung cancer at the age of 40. There is a paternal uncle who may have had pancreatic cancer. There is no history of ovarian cancer in the family.  GYNECOLOGIC HISTORY:  No LMP recorded. Patient is postmenopausal.  menarche age 89, first live birth age 5. The patient is GX P2. She stopped having periods in 1998, took hormone replacement approximately 6 months. Remotely she took oral contraceptives about 3 years with no complications  SOCIAL HISTORY: (updated April 2018)  Jessica Gilbert works as an Optometrist. She plans to retire April 2018.  She describes herself is single. At home she lives by herself, with 3 cats. Her son Jessica Gilbert lives in [place] and works as a Engineer, structural. The patient's other son died 85 years ago. Jessica Gilbert has 2 grandchildren, a Geographical information systems officer who is a Conservation officer, nature at [employer] and a 39 y/o grandson looking for a job. She is a Tourist information centre manager.    ADVANCED DIRECTIVES:  In place. Her son Jessica Gilbert is her healthcare power of attorney. He can be  reached at [place]: Social History   Tobacco Use  . Smoking status: Former Smoker    Packs/day: 1.00    Years: 35.00    Pack years: 35.00    Types: Cigarettes    Last attempt to quit: 01/29/2004    Years since quitting: 13.7  . Smokeless tobacco: Never Used  Substance Use Topics  . Alcohol use: Yes    Alcohol/week: 0.0 oz    Comment: wine occasionally  . Drug use: No     Colonoscopy:  PAP:  Bone density: remote.   Lipid panel:  Allergies  Allergen Reactions  . Aspirin Other (See Comments)    "burning stomach"  . Codeine Nausea Only  . Vitamin D Analogs Other (See Comments)    Current Outpatient Medications  Medication Sig Dispense Refill  . albuterol (PROVENTIL HFA;VENTOLIN HFA) 108 (90 BASE) MCG/ACT inhaler Inhale 2 puffs into the lungs every 6 (six) hours as needed for wheezing or shortness of breath. 1 Inhaler 3  . azithromycin (ZITHROMAX) 250 MG tablet Take two tablets (517m) on day one then take one  tablet a day until gone. 6 each 0  . diphenhydrAMINE (BENADRYL) 25 MG tablet Take 25 mg by mouth every 8 (eight) hours as needed.    Marland Kitchen guaiFENesin (MUCINEX) 600 MG 12 hr tablet Take by mouth 2 (two) times daily.    Marland Kitchen ibuprofen (ADVIL,MOTRIN) 200 MG tablet Take 200 mg by mouth every 6 (six) hours as needed.    . methylPREDNISolone (MEDROL DOSEPAK) 4 MG TBPK tablet Take all tablets at breakfast with food. Day one: take 6 tablets Day two: take 5 tablets Day three: take 4 tablets Day four:take 3 tablets Day five: take two tablets Day six: take one tablet 21 tablet 0  . mometasone-formoterol (DULERA) 200-5 MCG/ACT AERO Inhale 2 puffs into the lungs 2 (two) times daily. 1 Inhaler 5  . Multiple Vitamin (MULTIVITAMIN WITH MINERALS) TABS tablet Take 1 tablet by mouth daily.    Marland Kitchen telmisartan (MICARDIS) 40 MG tablet Take 40 mg by mouth daily.     No current facility-administered medications for this visit.     OBJECTIVE:  Middle-aged white woman  who appears stated age  48:   10/29/17 1504  BP: 121/74  Pulse: 96  Resp: 18  Temp: 98.8 F (37.1 C)  SpO2: 91%     Body mass index is 24.1 kg/m.    ECOG FS:2 - Symptomatic, <50% confined to bed  Sclerae unicteric, pupils round and equal Oropharynx clear and moist No cervical or supraclavicular adenopathy Lungs no rales or rhonchi; frequent wet sounding cough during exam Heart regular rate and rhythm Abd soft, nontender, positive bowel sounds MSK no focal spinal tenderness, no upper extremity lymphedema Neuro: nonfocal, well oriented, appropriate affect Breasts: On the right breast is unremarkable.  The left breast has undergone lumpectomy followed by radiation with no evidence of local recurrence.  Both axillae are benign.  LAB RESULTS:  CMP     Component Value Date/Time   NA 138 10/27/2017 1116   NA 140 10/08/2016 1152   K 4.9 10/27/2017 1116   K 4.3 10/08/2016 1152   CL 103 10/27/2017 1116   CO2 27 10/27/2017 1116   CO2 25 10/08/2016 1152   GLUCOSE 99 10/27/2017 1116   GLUCOSE 98 10/08/2016 1152   BUN 16 10/27/2017 1116   BUN 12.4 10/08/2016 1152   CREATININE 0.96 10/27/2017 1116   CREATININE 0.8 10/08/2016 1152   CALCIUM 9.8 10/27/2017 1116   CALCIUM 9.7 10/08/2016 1152   PROT 7.7 10/27/2017 1116   PROT 7.1 10/08/2016 1152   ALBUMIN 4.2 10/27/2017 1116   ALBUMIN 4.1 10/08/2016 1152   AST 28 10/27/2017 1116   AST 23 10/08/2016 1152   ALT 21 10/27/2017 1116   ALT 18 10/08/2016 1152   ALKPHOS 87 10/27/2017 1116   ALKPHOS 75 10/08/2016 1152   BILITOT 0.4 10/27/2017 1116   BILITOT 0.42 10/08/2016 1152   GFRNONAA 58 (L) 10/27/2017 1116   GFRAA >60 10/27/2017 1116    INo results found for: SPEP, UPEP  Lab Results  Component Value Date   WBC 8.8 10/27/2017   NEUTROABS 5.3 10/27/2017   HGB 14.1 10/27/2017   HCT 43.0 10/27/2017   MCV 91.3 10/27/2017   PLT 330 10/27/2017      Chemistry      Component Value Date/Time   NA 138 10/27/2017 1116   NA  140 10/08/2016 1152   K 4.9 10/27/2017 1116   K 4.3 10/08/2016 1152   CL 103 10/27/2017 1116   CO2 27 10/27/2017 1116  CO2 25 10/08/2016 1152   BUN 16 10/27/2017 1116   BUN 12.4 10/08/2016 1152   CREATININE 0.96 10/27/2017 1116   CREATININE 0.8 10/08/2016 1152      Component Value Date/Time   CALCIUM 9.8 10/27/2017 1116   CALCIUM 9.7 10/08/2016 1152   ALKPHOS 87 10/27/2017 1116   ALKPHOS 75 10/08/2016 1152   AST 28 10/27/2017 1116   AST 23 10/08/2016 1152   ALT 21 10/27/2017 1116   ALT 18 10/08/2016 1152   BILITOT 0.4 10/27/2017 1116   BILITOT 0.42 10/08/2016 1152       No results found for: LABCA2  No components found for: LABCA125  No results for input(s): INR in the last 168 hours.  Urinalysis No results found for: COLORURINE, APPEARANCEUR, LABSPEC, PHURINE, GLUCOSEU, HGBUR, BILIRUBINUR, KETONESUR, PROTEINUR, UROBILINOGEN, NITRITE, LEUKOCYTESUR  STUDIES: No results found.  ASSESSMENT: 72 y.o.  [place] woman status post left breast lower outer quadrant biopsy 06/15/2015 for a clinical mT1c N0, stage IA  Invasive ductal carcinoma, grade 1, estrogen and progesterone receptor positive, HER-2 not amplified, with an MIB-1 of 11%  (1) status post left lumpectomy with sentinel lymph node sampling 07/17/2015 for a pT1b pN0, stage IA invasive ductal carcinoma, grade 2, with negative though close margins; repeat HER-2 was again negative, and estrogen and progesterone receptors were again positive  (2) Oncotype DX score of 16 predicts an outside the breast risk of recurrence within 10 years of 10% if the patient's only systemic therapy is tamoxifen for 5 years. It also her next no benefit from chemotherapy  (3)  The patient opted against adjuvant radiation  (4)  Anastrozole started 09/07/2015 discontinued May 2017 with intolerance  (a) exemestane started June 2017, discontinued August 2017 with intolerance.  (b) [facility] at [facility] 11/05/2015 shows a T score  of -2.0   (5) Factor V Leiden positive  (6) s/p right upper lobectomy after chemo-radiation August  2005 for a primary lung cancer  PLAN: Lucilia is now a little over 2 years out from definitive surgery for breast cancer with no evidence of disease recurrence.  This is very favorable.  She is again a little bit behind on her mammography.  She does not want diagnostic mammograms because they charged her $800 to get that done where as a screening is free.  Accordingly I have written for bilateral screening mammography this year and placed a comment to help her out.  She did not get along with her prior pulmonary physicians.  She has tried to.  I gave her third suggestions which she may try.  Aside from the pulmonary problems she is doing well and certainly there is no evidence of disease recurrence at this point.  She will call with any problems that may develop before her next visit here. Magrinat, Virgie Dad, MD  10/29/17 3:21 PM Medical Oncology and Hematology [facility] [address] [place], [place] [address] Tel. [phone]    Fax. [phone]  This document serves as a record of services personally performed by Chauncey Cruel, MD. It was created on his behalf by Margit Banda, a trained medical scribe. The creation of this record is based on the scribe's personal observations and the provider's statements to them.   I have reviewed the above documentation for accuracy and completeness, and I agree with the above.

## 2017-10-30 ENCOUNTER — Telehealth: Payer: Self-pay | Admitting: Oncology

## 2017-10-30 NOTE — Telephone Encounter (Signed)
Left message re July 2020 lab/fu. [facility] will call re mammo.

## 2018-02-18 ENCOUNTER — Encounter: Payer: Self-pay | Admitting: Oncology

## 2018-02-18 DIAGNOSIS — Z1231 Encounter for screening mammogram for malignant neoplasm of breast: Secondary | ICD-10-CM | POA: Diagnosis not present

## 2018-02-18 DIAGNOSIS — Z853 Personal history of malignant neoplasm of breast: Secondary | ICD-10-CM | POA: Diagnosis not present

## 2018-02-25 DIAGNOSIS — H2512 Age-related nuclear cataract, left eye: Secondary | ICD-10-CM | POA: Diagnosis not present

## 2018-04-02 DIAGNOSIS — Z23 Encounter for immunization: Secondary | ICD-10-CM | POA: Diagnosis not present

## 2018-12-23 ENCOUNTER — Telehealth: Payer: Self-pay | Admitting: Hematology

## 2018-12-23 NOTE — Telephone Encounter (Signed)
error 

## 2018-12-27 ENCOUNTER — Ambulatory Visit: Payer: Medicare HMO | Admitting: Oncology

## 2018-12-27 ENCOUNTER — Other Ambulatory Visit: Payer: Medicare HMO

## 2019-04-05 DIAGNOSIS — Z23 Encounter for immunization: Secondary | ICD-10-CM | POA: Diagnosis not present

## 2019-04-12 DIAGNOSIS — Z1231 Encounter for screening mammogram for malignant neoplasm of breast: Secondary | ICD-10-CM | POA: Diagnosis not present

## 2019-04-12 DIAGNOSIS — Z853 Personal history of malignant neoplasm of breast: Secondary | ICD-10-CM | POA: Diagnosis not present

## 2019-11-11 DIAGNOSIS — I10 Essential (primary) hypertension: Secondary | ICD-10-CM | POA: Diagnosis not present

## 2019-11-16 DIAGNOSIS — Z8673 Personal history of transient ischemic attack (TIA), and cerebral infarction without residual deficits: Secondary | ICD-10-CM | POA: Diagnosis not present

## 2019-11-16 DIAGNOSIS — J439 Emphysema, unspecified: Secondary | ICD-10-CM | POA: Diagnosis not present

## 2019-11-16 DIAGNOSIS — D6851 Activated protein C resistance: Secondary | ICD-10-CM | POA: Diagnosis not present

## 2019-11-16 DIAGNOSIS — Z85118 Personal history of other malignant neoplasm of bronchus and lung: Secondary | ICD-10-CM | POA: Diagnosis not present

## 2019-11-16 DIAGNOSIS — Z853 Personal history of malignant neoplasm of breast: Secondary | ICD-10-CM | POA: Diagnosis not present

## 2019-11-16 DIAGNOSIS — I10 Essential (primary) hypertension: Secondary | ICD-10-CM | POA: Diagnosis not present

## 2019-11-16 DIAGNOSIS — I779 Disorder of arteries and arterioles, unspecified: Secondary | ICD-10-CM | POA: Diagnosis not present

## 2019-11-16 DIAGNOSIS — Z0000 Encounter for general adult medical examination without abnormal findings: Secondary | ICD-10-CM | POA: Diagnosis not present

## 2019-11-16 DIAGNOSIS — E7800 Pure hypercholesterolemia, unspecified: Secondary | ICD-10-CM | POA: Diagnosis not present

## 2020-01-08 NOTE — Progress Notes (Signed)
[facility]  [phone] [phone]     ID: Jessica [facility] DOB: 04/25/1946  MR#: [id number]  FXT#:[id number]  Patient Care Team: Jessica Fess, MD as PCP - [facility] (Family Medicine) Jessica Overall, MD as Consulting Physician ([facility]) [facility], Jessica Dad, MD as Consulting Physician (Oncology) Jessica Gibson, MD as Attending Physician (Radiation Oncology) Jessica Cheese, NP as Nurse Practitioner (Hematology and Oncology)  CHIEF COMPLAINT:  Estrogen receptor positive breast cancer  CURRENT TREATMENT:  Observation   INTERVAL HISTORY: Jessica [facility] returns today for follow-up of her estrogen receptor positive breast cancer.  She continues under observation alone as she was not able to tolerate antiestrogens. We last saw her in May 2019.    She tells me that for the past year and a half she has been pretty much staying home.  She is very careful regarding the pandemic and has received both doses of the majority of vaccine which she tolerated well.  She underwent bilateral screening mammography with tomography at [facility] on 04/12/2019 showing: breast density category C; no evidence of malignancy in either breast.    REVIEW OF SYSTEMS: Jessica [facility] tells me she walks about 15 minutes at a time at least twice daily.  She says she does not really have emphysema but exercise induced shortness of breath.  She denies significant cough, phlegm production, or pleurisy, except that if she bends down to tie her shoe she can get a spasm in the right anterior rib cage area in a very specific place which indicates is the problem is in the chest wall and may be related to her remote surgery.  She has had no unusual headaches visual changes nausea vomiting or change in bowel or bladder habits.  A detailed review of systems today was otherwise noncontributory   BREAST CANCER HISTORY: From the original intake note:   Jessica [facility] (pronounced "ro-lund") had bilateral  screening mammography at the [facility] 05/26/2014 showing a possible mass in the left breast. Left digital mammography with ultrasonography 06/14/2014 found a breast density to be category C. There was no persistent mass or distortion or malignant-type microcalcifications. . There was no palpable mass. Ultrasound showed a well circumscribed benign appearing nodule in the left breast 2:00 position measuring 4 mm. This was felt to be most likely benign but repeat ultrasound  At 6 months was recommended and thiswas performed 12/14/2014.  The mass was unchanged an incidental note was made of a benign appearing 0.5 cm lymph node in the left breast 2:00 position.   On 06/15/2015 Jessica Gilbert had bilateral diagnostic mammography with tomosynthesis and this time an irregular mass with spiculated margins was noted in the outer lower left breast measuring approximately 9 mm. Physical examination confirmed a firm fixed nodule at the 3:30 o'clock position an ultrasound of this area confirmed an irregular hypoechoic mass measuring 0.7 cm. There was a 0.2 cm adjacent satellite nodule. There was no left axillary lymphadenopathy.   biopsy of this mass was obtained 06/15/2015, and showed ([id number]) an invasive ductal carcinoma, grade 1, estrogen receptor 100% positive, progesterone receptor 40% positive, both with strong staining intensity, with an MIB-1 of 11%, and no HER-2 amplification, the signals ratio being 1.39 and the number per cell 2.15.   Jessica [facility] also has a history of prior tobacco abuse, status post right upper lobectomy for lung cancer, with significant emphysema.   The patient's subsequent history is as detailed below   PAST MEDICAL HISTORY: Past Medical History:  Diagnosis Date  .  Acute respiratory failure ([facility])   . Breast cancer ([facility])   . Carotid stenosis, right   . Celiac disease   . Emphysema lung ([facility])   . History of hiatal hernia   . Human metapneumovirus pneumonia March 2015  . Hyperlipidemia    . Hypertension   . Lung cancer ([facility]) 2005   Rt upper lobe  . Stroke ([facility])    TIA w/o defecits    PAST SURGICAL HISTORY: Past Surgical History:  Procedure Laterality Date  . BREAST LUMPECTOMY     left   . BREAST LUMPECTOMY WITH RADIOACTIVE SEED AND SENTINEL LYMPH NODE BIOPSY Left 07/17/2015   Procedure: LEFT BREAST LUMPECTOMY WITH RADIOACTIVE SEED WITH LEFT AXILLARY SENTINEL LYMPH NODE BIOPSY;  Surgeon: Jessica Overall, MD;  Location: [facility];  Laterality: Left;  . LUNG REMOVAL, PARTIAL Right 2005   Upper lobe  . TRIGGER FINGER RELEASE    . TUBAL LIGATION  1980    FAMILY HISTORY Family History  Problem Relation Age of Onset  . Heart disease Father   . High blood pressure Father   . Congestive Heart Failure Father   . Leukemia Mother   . Colon polyps Mother   . High blood pressure Brother   . Lung cancer Brother   . Heart disease Brother   . Diabetes Brother   . Colon polyps Sister   . Breast cancer Sister   . High blood pressure Sister   . Emphysema Sister   . Pancreatic cancer Paternal Uncle    the patient's father died at the age of 50 with congestive 40 failure. Patient's mother died with acute leukemia at the age of 46. The patient had one brother, 2 sisters. One sister had breast cancer at age 32. One brother, smoker, was diagnosed with lung cancer at the age of 30. There is a paternal uncle who may have had pancreatic cancer. There is no history of ovarian cancer in the family.   GYNECOLOGIC HISTORY:  No LMP recorded. Patient is postmenopausal.  menarche age 30, first live birth age 80. The patient is GX P2. She stopped having periods in 1998, took hormone replacement approximately 6 months. Remotely she took oral contraceptives about 3 years with no complications   SOCIAL HISTORY: (updated July 2021)  Jessica Gilbert worked as an Optometrist.  She describes herself is single. At home she lives by herself, with 3 cats. Her son Jessica [facility]  lives in [place] and was recently promoted to Tax adviser in Event organiser. The patient's other son died 36 years ago. Korissa has 2 grandchildren, a Geographical information systems officer who is a Conservation officer, nature at [employer] and a 42 y/o grandson. She is a Tourist information centre manager.    ADVANCED DIRECTIVES:  In place. Her son Jessica [facility] is her healthcare power of attorney. He can be reached at [facility]: Social History   Tobacco Use  . Smoking status: Former Smoker    Packs/day: 1.00    Years: 35.00    Pack years: 35.00    Types: Cigarettes    Quit date: 01/29/2004    Years since quitting: 15.9  . Smokeless tobacco: Never Used  Vaping Use  . Vaping Use: Never used  Substance Use Topics  . Alcohol use: Yes    Alcohol/week: 0.0 standard drinks    Comment: wine occasionally  . Drug use: No     Colonoscopy:  PAP:  Bone density: remote.   Lipid panel:  Allergies  Allergen Reactions  .  Aspirin Other (See Comments)    "burning stomach"  . Codeine Nausea Only  . Vitamin D Analogs Other (See Comments)    Current Outpatient Medications  Medication Sig Dispense Refill  . albuterol (PROVENTIL HFA;VENTOLIN HFA) 108 (90 BASE) MCG/ACT inhaler Inhale 2 puffs into the lungs every 6 (six) hours as needed for wheezing or shortness of breath. 1 Inhaler 3  . azithromycin (ZITHROMAX) 250 MG tablet Take two tablets (598m) on day one then take one tablet a day until gone. 6 each 0  . diphenhydrAMINE (BENADRYL) 25 MG tablet Take 25 mg by mouth every 8 (eight) hours as needed.    .Marland KitchenguaiFENesin (MUCINEX) 600 MG 12 hr tablet Take by mouth 2 (two) times daily.    .Marland Kitchenibuprofen (ADVIL,MOTRIN) 200 MG tablet Take 200 mg by mouth every 6 (six) hours as needed.    . methylPREDNISolone (MEDROL DOSEPAK) 4 MG TBPK tablet Take all tablets at breakfast with food. Day one: take 6 tablets Day two: take 5 tablets Day three: take 4 tablets Day four:take 3 tablets Day five: take two tablets Day six: take one tablet 21 tablet 0  .  mometasone-formoterol (DULERA) 200-5 MCG/ACT AERO Inhale 2 puffs into the lungs 2 (two) times daily. 1 Inhaler 5  . Multiple Vitamin (MULTIVITAMIN WITH MINERALS) TABS tablet Take 1 tablet by mouth daily.    .Marland Kitchentelmisartan (MICARDIS) 40 MG tablet Take 40 mg by mouth daily.     No current facility-administered medications for this visit.    OBJECTIVE:   white woman who appears stated age  V63   01/09/20 1524  BP: (!) 135/84  Pulse: 98  Resp: 18  Temp: 98.5 F (36.9 C)  SpO2: 93%     Body mass index is 21.87 kg/m.    ECOG FS:2 - Symptomatic, <50% confined to bed  Sclerae unicteric, EOMs intact Wearing a mask No cervical or supraclavicular adenopathy Lungs no rales or rhonchi Heart regular rate and rhythm Abd soft, nontender, positive bowel sounds MSK no focal spinal tenderness, no upper extremity lymphedema Neuro: nonfocal, well oriented, appropriate affect Breasts: The right breast is benign per the left breast is status post lumpectomy and radiation.  There is no evidence of residual or recurrent disease.  Both axillae are benign.   LAB RESULTS:  CMP     Component Value Date/Time   NA 139 01/09/2020 1503   NA 140 10/08/2016 1152   K 4.7 01/09/2020 1503   K 4.3 10/08/2016 1152   CL 103 01/09/2020 1503   CO2 24 01/09/2020 1503   CO2 25 10/08/2016 1152   GLUCOSE 90 01/09/2020 1503   GLUCOSE 98 10/08/2016 1152   BUN 10 01/09/2020 1503   BUN 12.4 10/08/2016 1152   CREATININE 0.85 01/09/2020 1503   CREATININE 0.8 10/08/2016 1152   CALCIUM 10.3 01/09/2020 1503   CALCIUM 9.7 10/08/2016 1152   PROT 7.2 01/09/2020 1503   PROT 7.1 10/08/2016 1152   ALBUMIN 3.9 01/09/2020 1503   ALBUMIN 4.1 10/08/2016 1152   AST 24 01/09/2020 1503   AST 23 10/08/2016 1152   ALT 19 01/09/2020 1503   ALT 18 10/08/2016 1152   ALKPHOS 75 01/09/2020 1503   ALKPHOS 75 10/08/2016 1152   BILITOT 0.5 01/09/2020 1503   BILITOT 0.42 10/08/2016 1152   GFRNONAA >60 01/09/2020 1503   GFRAA >60  01/09/2020 1503    INo results found for: SPEP, UPEP  Lab Results  Component Value Date   WBC 9.8 01/09/2020  NEUTROABS 6.6 01/09/2020   HGB 13.2 01/09/2020   HCT 39.9 01/09/2020   MCV 86.0 01/09/2020   PLT 283 01/09/2020      Chemistry      Component Value Date/Time   NA 139 01/09/2020 1503   NA 140 10/08/2016 1152   K 4.7 01/09/2020 1503   K 4.3 10/08/2016 1152   CL 103 01/09/2020 1503   CO2 24 01/09/2020 1503   CO2 25 10/08/2016 1152   BUN 10 01/09/2020 1503   BUN 12.4 10/08/2016 1152   CREATININE 0.85 01/09/2020 1503   CREATININE 0.8 10/08/2016 1152      Component Value Date/Time   CALCIUM 10.3 01/09/2020 1503   CALCIUM 9.7 10/08/2016 1152   ALKPHOS 75 01/09/2020 1503   ALKPHOS 75 10/08/2016 1152   AST 24 01/09/2020 1503   AST 23 10/08/2016 1152   ALT 19 01/09/2020 1503   ALT 18 10/08/2016 1152   BILITOT 0.5 01/09/2020 1503   BILITOT 0.42 10/08/2016 1152       No results found for: LABCA2  No components found for: LABCA125  No results for input(s): INR in the last 168 hours.  Urinalysis No results found for: COLORURINE, APPEARANCEUR, LABSPEC, PHURINE, GLUCOSEU, HGBUR, BILIRUBINUR, KETONESUR, PROTEINUR, UROBILINOGEN, NITRITE, LEUKOCYTESUR   STUDIES: No results found.   ASSESSMENT: 74 y.o.  Second [place] woman status post left breast lower outer quadrant biopsy 06/15/2015 for a clinical mT1c N0, stage IA  Invasive ductal carcinoma, grade 1, estrogen and progesterone receptor positive, HER-2 not amplified, with an MIB-1 of 11%  (1) status post left lumpectomy with sentinel lymph node sampling 07/17/2015 for a pT1b pN0, stage IA invasive ductal carcinoma, grade 2, with negative though close margins; repeat HER-2 was again negative, and estrogen and progesterone receptors were again positive  (2) Oncotype DX score of 16 predicts an outside the breast risk of recurrence within 10 years of 10% if the patient's only systemic therapy is tamoxifen for 5 years.  It also her next no benefit from chemotherapy  (3)  The patient opted against adjuvant radiation  (4)  Anastrozole started 09/07/2015 discontinued May 2017 with intolerance  (a) exemestane started June 2017, discontinued August 2017 with intolerance.  (b) bone density at the [facility] 11/05/2015 shows a T score of -2.0   (5) Factor V Leiden positive: elevated clotting risk with any procedure, immobilization, or procoagulant drugs such as estrogen  (6) s/p right upper lobectomy (after neoadjuvant chemo-radiation) August  2005 for a primary lung cancer: Non-small cell carcinoma, with a 4 cm area of tumor regression and necrosis and rare atypical cells but no obvious viable tumor remaining, and a total of 9 regional lymph nodes (N1, and 2) all negative.   PLAN:  Tanja is now is now 4-1/2 years out from definitive surgery for her breast cancer with no evidence of disease recurrence.  This is very favorable.  She is very nervous when she comes here and would prefer to be formally released from follow-up.  At this point I am comfortable doing that.  She does see Dr. Rex Kras regularly.  All she will need in terms of breast cancer follow-up is a yearly physician breast exam and yearly screening mammography which she obtains at [facility] in October  Patient has been 16 years since her lung cancer surgery and she does not have any pulmonary follow-up.  I offered her a screening low-dose CT of the chest without contrast but at this point she opts against.  I will  be glad to see [facility] at any point in the future if and when the need arises but as of now we are making no further routine appointments for her here.  Total encounter time 25 minutes.   Khaliq Turay, Jessica Dad, MD  01/09/20 3:52 PM Medical Oncology and Hematology [facility] [place], [place] [address] Tel. [phone]    Fax. [phone]   I, Wilburn Mylar, am acting as scribe for Dr. Virgie [facility].  [facility].  I, Lurline Del MD, have reviewed the above documentation for accuracy and completeness, and I agree with the above.    *Total Encounter Time as defined by the Centers for Medicare and Medicaid Services includes, in addition to the face-to-face time of a patient visit (documented in the note above) non-face-to-face time: obtaining and reviewing outside history, ordering and reviewing medications, tests or procedures, care coordination (communications with other health care professionals or caregivers) and documentation in the medical record.

## 2020-01-09 ENCOUNTER — Other Ambulatory Visit: Payer: Self-pay

## 2020-01-09 ENCOUNTER — Other Ambulatory Visit: Payer: Self-pay | Admitting: *Deleted

## 2020-01-09 ENCOUNTER — Inpatient Hospital Stay: Payer: Medicare HMO

## 2020-01-09 ENCOUNTER — Inpatient Hospital Stay: Payer: Medicare HMO | Attending: Oncology | Admitting: Oncology

## 2020-01-09 VITALS — BP 135/84 | HR 98 | Temp 98.5°F | Resp 18 | Ht 64.0 in | Wt 127.4 lb

## 2020-01-09 DIAGNOSIS — C50512 Malignant neoplasm of lower-outer quadrant of left female breast: Secondary | ICD-10-CM | POA: Diagnosis not present

## 2020-01-09 DIAGNOSIS — C3411 Malignant neoplasm of upper lobe, right bronchus or lung: Secondary | ICD-10-CM | POA: Diagnosis not present

## 2020-01-09 DIAGNOSIS — Z853 Personal history of malignant neoplasm of breast: Secondary | ICD-10-CM | POA: Insufficient documentation

## 2020-01-09 DIAGNOSIS — D6851 Activated protein C resistance: Secondary | ICD-10-CM | POA: Insufficient documentation

## 2020-01-09 DIAGNOSIS — I10 Essential (primary) hypertension: Secondary | ICD-10-CM | POA: Insufficient documentation

## 2020-01-09 DIAGNOSIS — Z170 Estrogen receptor positive status [ER+]: Secondary | ICD-10-CM | POA: Diagnosis not present

## 2020-01-09 LAB — CBC WITH DIFFERENTIAL (CANCER CENTER ONLY)
Abs Immature Granulocytes: 0.03 10*3/uL (ref 0.00–0.07)
Basophils Absolute: 0 10*3/uL (ref 0.0–0.1)
Basophils Relative: 0 %
Eosinophils Absolute: 0 10*3/uL (ref 0.0–0.5)
Eosinophils Relative: 0 %
HCT: 39.9 % (ref 36.0–46.0)
Hemoglobin: 13.2 g/dL (ref 12.0–15.0)
Immature Granulocytes: 0 %
Lymphocytes Relative: 22 %
Lymphs Abs: 2.2 10*3/uL (ref 0.7–4.0)
MCH: 28.4 pg (ref 26.0–34.0)
MCHC: 33.1 g/dL (ref 30.0–36.0)
MCV: 86 fL (ref 80.0–100.0)
Monocytes Absolute: 0.9 10*3/uL (ref 0.1–1.0)
Monocytes Relative: 9 %
Neutro Abs: 6.6 10*3/uL (ref 1.7–7.7)
Neutrophils Relative %: 69 %
Platelet Count: 283 10*3/uL (ref 150–400)
RBC: 4.64 MIL/uL (ref 3.87–5.11)
RDW: 13 % (ref 11.5–15.5)
WBC Count: 9.8 10*3/uL (ref 4.0–10.5)
nRBC: 0 % (ref 0.0–0.2)

## 2020-01-09 LAB — CMP (CANCER CENTER ONLY)
ALT: 19 U/L (ref 0–44)
AST: 24 U/L (ref 15–41)
Albumin: 3.9 g/dL (ref 3.5–5.0)
Alkaline Phosphatase: 75 U/L (ref 38–126)
Anion gap: 12 (ref 5–15)
BUN: 10 mg/dL (ref 8–23)
CO2: 24 mmol/L (ref 22–32)
Calcium: 10.3 mg/dL (ref 8.9–10.3)
Chloride: 103 mmol/L (ref 98–111)
Creatinine: 0.85 mg/dL (ref 0.44–1.00)
GFR, Est AFR Am: >60 mL/min
GFR, Estimated: >60 mL/min
Glucose, Bld: 90 mg/dL (ref 70–99)
Potassium: 4.7 mmol/L (ref 3.5–5.1)
Sodium: 139 mmol/L (ref 135–145)
Total Bilirubin: 0.5 mg/dL (ref 0.3–1.2)
Total Protein: 7.2 g/dL (ref 6.5–8.1)

## 2020-01-10 ENCOUNTER — Telehealth: Payer: Self-pay | Admitting: Oncology

## 2020-01-10 NOTE — Telephone Encounter (Signed)
No 5/26 los. No changes made to pt's schedule.

## 2020-02-16 DIAGNOSIS — I779 Disorder of arteries and arterioles, unspecified: Secondary | ICD-10-CM | POA: Diagnosis not present

## 2020-02-16 DIAGNOSIS — J439 Emphysema, unspecified: Secondary | ICD-10-CM | POA: Diagnosis not present

## 2020-02-16 DIAGNOSIS — I10 Essential (primary) hypertension: Secondary | ICD-10-CM | POA: Diagnosis not present

## 2020-02-16 DIAGNOSIS — Z79899 Other long term (current) drug therapy: Secondary | ICD-10-CM | POA: Diagnosis not present

## 2020-02-16 DIAGNOSIS — Z8673 Personal history of transient ischemic attack (TIA), and cerebral infarction without residual deficits: Secondary | ICD-10-CM | POA: Diagnosis not present

## 2020-02-16 DIAGNOSIS — Z853 Personal history of malignant neoplasm of breast: Secondary | ICD-10-CM | POA: Diagnosis not present

## 2020-02-16 DIAGNOSIS — E7800 Pure hypercholesterolemia, unspecified: Secondary | ICD-10-CM | POA: Diagnosis not present

## 2020-02-16 DIAGNOSIS — Z0000 Encounter for general adult medical examination without abnormal findings: Secondary | ICD-10-CM | POA: Diagnosis not present

## 2020-02-16 DIAGNOSIS — D6851 Activated protein C resistance: Secondary | ICD-10-CM | POA: Diagnosis not present

## 2020-04-03 DIAGNOSIS — Z23 Encounter for immunization: Secondary | ICD-10-CM | POA: Diagnosis not present

## 2020-04-12 DIAGNOSIS — Z1231 Encounter for screening mammogram for malignant neoplasm of breast: Secondary | ICD-10-CM | POA: Diagnosis not present

## 2020-04-17 DIAGNOSIS — R928 Other abnormal and inconclusive findings on diagnostic imaging of breast: Secondary | ICD-10-CM | POA: Diagnosis not present

## 2020-04-17 DIAGNOSIS — R922 Inconclusive mammogram: Secondary | ICD-10-CM | POA: Diagnosis not present

## 2020-05-01 ENCOUNTER — Other Ambulatory Visit: Payer: Self-pay

## 2020-05-01 DIAGNOSIS — C50511 Malignant neoplasm of lower-outer quadrant of right female breast: Secondary | ICD-10-CM | POA: Diagnosis not present

## 2020-05-01 DIAGNOSIS — N6323 Unspecified lump in the left breast, lower outer quadrant: Secondary | ICD-10-CM | POA: Diagnosis not present

## 2020-05-04 ENCOUNTER — Ambulatory Visit: Payer: Self-pay | Admitting: General Surgery

## 2020-05-04 DIAGNOSIS — C50512 Malignant neoplasm of lower-outer quadrant of left female breast: Secondary | ICD-10-CM

## 2020-05-04 DIAGNOSIS — Z170 Estrogen receptor positive status [ER+]: Secondary | ICD-10-CM | POA: Diagnosis not present

## 2020-05-08 ENCOUNTER — Telehealth: Payer: Self-pay | Admitting: Oncology

## 2020-05-08 NOTE — Telephone Encounter (Signed)
Scheduled appointment per 11/23 referral. Spoke to patient who is aware of appointment date and time.

## 2020-05-14 ENCOUNTER — Other Ambulatory Visit: Payer: Self-pay | Admitting: Oncology

## 2020-05-14 ENCOUNTER — Other Ambulatory Visit: Payer: Self-pay

## 2020-05-14 DIAGNOSIS — C50512 Malignant neoplasm of lower-outer quadrant of left female breast: Secondary | ICD-10-CM

## 2020-05-14 NOTE — Progress Notes (Signed)
Jessica Gilbert  [phone] Fax:([phone]) [phone]     ID: Jessica Gilbert DOB: [birth year]  MR#: [id number]  ION#:[id number]  Patient Care Team: Jessica Gilbert Fess, MD as PCP - General (Family Medicine) [name], MD as Consulting Physician (General Surgery) Jinelle Butchko, Virgie Dad, MD as Consulting Physician (Oncology) [name], MD as Attending Physician (Radiation Oncology) [name], NP as Nurse Practitioner (Hematology and Oncology)  CHIEF COMPLAINT:  Estrogen receptor positive breast cancer  CURRENT TREATMENT: Definitive surgery pending   INTERVAL HISTORY: Jessica Gilbert returns today for follow-up of her estrogen receptor positive breast cancer.  She continues under observation alone as she was not able to tolerate antiestrogens.   Since her last visit, routine mammography at [facility] showed a new 5 mm mass in the lower-outer left breast with clinically negative lymph nodes. She proceeded to biopsy of the left breast area in question on 05/01/2020 with pathology [id number]) showing: invasive ductal carcinoma, grade 1-2; estrogen receptor 95% positive, progesterone receptor 90% positive, both with strong staining intensity; Ki67 of 25%; Her2 negative by immunohistochemistry (1+).  She is scheduled for left lumpectomy on 05/28/2020 with Dr. Marlou Gilbert.   REVIEW OF SYSTEMS: Jessica Gilbert tells me she is angry and frustrated.  The biopsy was painful.  She is not looking forward to having a seat placed.  She thought the clip placed during the biopsy would serve that purpose.  She does not understand how she could have been given the [facility] in July and now has a new cancer.  She wonders whether the lab work we obtained here is of any use.  A detailed review of systems today was otherwise stable.   COVID 19 VACCINATION STATUS:    BREAST CANCER HISTORY: From the original intake note:   Jessica Gilbert (pronounced "ro-lund") had bilateral screening mammography at the breast  Center 05/26/2014 showing a possible mass in the left breast. Left digital mammography with ultrasonography 06/14/2014 found a breast density to be category C. There was no persistent mass or distortion or malignant-type microcalcifications. . There was no palpable mass. Ultrasound showed a well circumscribed benign appearing nodule in the left breast 2:00 position measuring 4 mm. This was felt to be most likely benign but repeat ultrasound  At 6 months was recommended and thiswas performed 12/14/2014.  The mass was unchanged an incidental note was made of a benign appearing 0.5 cm lymph node in the left breast 2:00 position.   On 06/15/2015 Jessica Gilbert had bilateral diagnostic mammography with tomosynthesis and this time an irregular mass with spiculated margins was noted in the outer lower left breast measuring approximately 9 mm. Physical examination confirmed a firm fixed nodule at the 3:30 o'clock position an ultrasound of this area confirmed an irregular hypoechoic mass measuring 0.7 cm. There was a 0.2 cm adjacent satellite nodule. There was no left axillary lymphadenopathy.   biopsy of this mass was obtained 06/15/2015, and showed ([id number]) an invasive ductal carcinoma, grade 1, estrogen receptor 100% positive, progesterone receptor 40% positive, both with strong staining intensity, with an MIB-1 of 11%, and no HER-2 amplification, the signals ratio being 1.39 and the number per cell 2.15.   Jessica Gilbert also has a history of prior tobacco abuse, status post right upper lobectomy for lung cancer, with significant emphysema.   The patient's subsequent history is as detailed below   PAST MEDICAL HISTORY: Past Medical History:  Diagnosis Date  . Acute respiratory failure ([place])   . Breast cancer ([place])   . Carotid stenosis,  right   . Celiac disease   . Emphysema lung ([place])   . History of hiatal hernia   . Human metapneumovirus pneumonia March 2015  . Hyperlipidemia   . Hypertension   . Lung cancer  ([place]) 2005   Rt upper lobe  . Stroke ([place])    TIA w/o defecits    PAST SURGICAL HISTORY: Past Surgical History:  Procedure Laterality Date  . BREAST LUMPECTOMY     left   . BREAST LUMPECTOMY WITH RADIOACTIVE SEED AND SENTINEL LYMPH NODE BIOPSY Left 07/17/2015   Procedure: LEFT BREAST LUMPECTOMY WITH RADIOACTIVE SEED WITH LEFT AXILLARY SENTINEL LYMPH NODE BIOPSY;  Surgeon: [name], MD;  Location: [place];  Service: General;  Laterality: Left;  . LUNG REMOVAL, PARTIAL Right 2005   Upper lobe  . TRIGGER FINGER RELEASE    . TUBAL LIGATION  1980    FAMILY HISTORY Family History  Problem Relation Age of Onset  . Heart disease Father   . High blood pressure Father   . Congestive Heart Failure Father   . Leukemia Mother   . Colon polyps Mother   . High blood pressure Brother   . Lung cancer Brother   . Heart disease Brother   . Diabetes Brother   . Colon polyps Sister   . Breast cancer Sister   . High blood pressure Sister   . Emphysema Sister   . Pancreatic cancer Paternal Uncle    the patient's father died at the age of 17 with congestive 40 failure. Patient's mother died with acute leukemia at the age of 48. The patient had one brother, 2 sisters. One sister had breast cancer at age 30. One brother, smoker, was diagnosed with lung cancer at the age of 46. There is a paternal uncle who may have had pancreatic cancer. There is no history of ovarian cancer in the family.   GYNECOLOGIC HISTORY:  No LMP recorded. Patient is postmenopausal.  menarche age 81, first live birth age 71. The patient is GX P2. She stopped having periods in 1998, took hormone replacement approximately 6 months. Remotely she took oral contraceptives about 3 years with no complications   SOCIAL HISTORY: (updated July 2021)  Jessica Gilbert worked as an Optometrist.  She describes herself is single. At home she lives by herself, with 3 cats. Her son Jessica Gilbert lives in [place] and was  recently promoted to Tax adviser in Event organiser. The patient's other son died 37 years ago. Caden has 2 grandchildren, a Geographical information systems officer who is a Conservation officer, nature at [employer] and a 84 y/o grandson. She is a Tourist information centre manager.    ADVANCED DIRECTIVES:  In place. Her son [name] is her healthcare power of attorney. He can be reached at [place]: Social History   Tobacco Use  . Smoking status: Former Smoker    Packs/day: 1.00    Years: 35.00    Pack years: 35.00    Types: Cigarettes    Quit date: 01/29/2004    Years since quitting: 16.3  . Smokeless tobacco: Never Used  Vaping Use  . Vaping Use: Never used  Substance Use Topics  . Alcohol use: Yes    Alcohol/week: 0.0 standard drinks    Comment: wine occasionally  . Drug use: No     Colonoscopy:  PAP:  Bone density: remote.   Lipid panel:  Allergies  Allergen Reactions  . Aspirin Other (See Comments)    "burning stomach"  . Codeine Nausea Only  .  Vitamin D Analogs Other (See Comments)    Current Outpatient Medications  Medication Sig Dispense Refill  . albuterol (PROVENTIL HFA;VENTOLIN HFA) 108 (90 BASE) MCG/ACT inhaler Inhale 2 puffs into the lungs every 6 (six) hours as needed for wheezing or shortness of breath. 1 Inhaler 3  . azithromycin (ZITHROMAX) 250 MG tablet Take two tablets (57m) on day one then take one tablet a day until gone. 6 each 0  . diphenhydrAMINE (BENADRYL) 25 MG tablet Take 25 mg by mouth every 8 (eight) hours as needed.    .Marland KitchenguaiFENesin (MUCINEX) 600 MG 12 hr tablet Take by mouth 2 (two) times daily.    .Marland Kitchenibuprofen (ADVIL,MOTRIN) 200 MG tablet Take 200 mg by mouth every 6 (six) hours as needed.    . methylPREDNISolone (MEDROL DOSEPAK) 4 MG TBPK tablet Take all tablets at breakfast with food. Day one: take 6 tablets Day two: take 5 tablets Day three: take 4 tablets Day four:take 3 tablets Day five: take two tablets Day six: take one tablet 21 tablet 0  . mometasone-formoterol (DULERA) 200-5  MCG/ACT AERO Inhale 2 puffs into the lungs 2 (two) times daily. 1 Inhaler 5  . Multiple Vitamin (MULTIVITAMIN WITH MINERALS) TABS tablet Take 1 tablet by mouth daily.    .Marland Kitchentelmisartan (MICARDIS) 40 MG tablet Take 40 mg by mouth daily.     No current facility-administered medications for this visit.    OBJECTIVE:   white woman who appears stated age; she preferred not to undress for an exam today  Vitals:   05/15/20 1619  BP: 138/72  Pulse: (!) 106  Resp: 19  Temp: 98.7 F (37.1 C)  SpO2: 94%     Body mass index is 21.46 kg/m.    ECOG FS:2 - Symptomatic, <50% confined to bed  Wearing a mask No cervical or supraclavicular adenopathy Lungs no rales or rhonchi Heart regular rate and rhythm Abd soft, nontender, positive bowel sounds MSK no focal spinal tenderness, no upper extremity lymphedema Neuro: nonfocal, well oriented, appropriate affect Breasts: I do not palpate a mass in the left breast.   LAB RESULTS:  CMP     Component Value Date/Time   NA 139 01/09/2020 1503   NA 140 10/08/2016 1152   K 4.7 01/09/2020 1503   K 4.3 10/08/2016 1152   CL 103 01/09/2020 1503   CO2 24 01/09/2020 1503   CO2 25 10/08/2016 1152   GLUCOSE 90 01/09/2020 1503   GLUCOSE 98 10/08/2016 1152   BUN 10 01/09/2020 1503   BUN 12.4 10/08/2016 1152   CREATININE 0.85 01/09/2020 1503   CREATININE 0.8 10/08/2016 1152   CALCIUM 10.3 01/09/2020 1503   CALCIUM 9.7 10/08/2016 1152   PROT 7.2 01/09/2020 1503   PROT 7.1 10/08/2016 1152   ALBUMIN 3.9 01/09/2020 1503   ALBUMIN 4.1 10/08/2016 1152   AST 24 01/09/2020 1503   AST 23 10/08/2016 1152   ALT 19 01/09/2020 1503   ALT 18 10/08/2016 1152   ALKPHOS 75 01/09/2020 1503   ALKPHOS 75 10/08/2016 1152   BILITOT 0.5 01/09/2020 1503   BILITOT 0.42 10/08/2016 1152   GFRNONAA >60 01/09/2020 1503   GFRAA >60 01/09/2020 1503    INo results found for: SPEP, UPEP  Lab Results  Component Value Date   WBC 10.9 (H) 05/15/2020   NEUTROABS 7.7  05/15/2020   HGB 13.7 05/15/2020   HCT 41.4 05/15/2020   MCV 88.8 05/15/2020   PLT 330 05/15/2020  Chemistry      Component Value Date/Time   NA 139 01/09/2020 1503   NA 140 10/08/2016 1152   K 4.7 01/09/2020 1503   K 4.3 10/08/2016 1152   CL 103 01/09/2020 1503   CO2 24 01/09/2020 1503   CO2 25 10/08/2016 1152   BUN 10 01/09/2020 1503   BUN 12.4 10/08/2016 1152   CREATININE 0.85 01/09/2020 1503   CREATININE 0.8 10/08/2016 1152      Component Value Date/Time   CALCIUM 10.3 01/09/2020 1503   CALCIUM 9.7 10/08/2016 1152   ALKPHOS 75 01/09/2020 1503   ALKPHOS 75 10/08/2016 1152   AST 24 01/09/2020 1503   AST 23 10/08/2016 1152   ALT 19 01/09/2020 1503   ALT 18 10/08/2016 1152   BILITOT 0.5 01/09/2020 1503   BILITOT 0.42 10/08/2016 1152       No results found for: LABCA2  No components found for: LABCA125  No results for input(s): INR in the last 168 hours.  Urinalysis No results found for: COLORURINE, APPEARANCEUR, LABSPEC, PHURINE, GLUCOSEU, HGBUR, BILIRUBINUR, KETONESUR, PROTEINUR, UROBILINOGEN, NITRITE, LEUKOCYTESUR   STUDIES: No results found.   ASSESSMENT: 74 y.o.  [place] woman status post left breast lower outer quadrant biopsy 06/15/2015 for a clinical mT1c N0, stage IA  Invasive ductal carcinoma, grade 1, estrogen and progesterone receptor positive, HER-2 not amplified, with an MIB-1 of 11%  (1) status post left lumpectomy with sentinel lymph node sampling 07/17/2015 for a pT1b pN0, stage IA invasive ductal carcinoma, grade 2, with negative though close margins; repeat HER-2 was again negative, and estrogen and progesterone receptors were again positive  (2) Oncotype DX score of 16 predicts an outside the breast risk of recurrence within 10 years of 10% if the patient's only systemic therapy is tamoxifen for 5 years. It also her next no benefit from chemotherapy  (3)  The patient opted against adjuvant radiation  (4)  Anastrozole started  09/07/2015 discontinued May 2017 with intolerance  (a) exemestane started June 2017, discontinued August 2017 with intolerance.  (b) [facility] at [facility] 11/05/2015 shows a T score of -2.0   (5) Factor V Leiden positive: elevated clotting risk with any procedure, immobilization, or procoagulant drugs such as estrogen  (6) s/p right upper lobectomy (after neoadjuvant chemo-radiation) August  2005 for a primary lung cancer: Non-small cell carcinoma, with a 4 cm area of tumor regression and necrosis and rare atypical cells but no obvious viable tumor remaining, and a total of 9 regional lymph nodes (N1, and 2) all negative.  (7) status post left breast upper outer quadrant 05/01/2020 shows invasive ductal carcinoma, low-grade, estrogen and progesterone receptor positive, HER-2 not amplified, with an MIB-1 of 25%  (8) definitive surgery pending   PLAN:  Jessica Gilbert is close to 5 years out from her original left breast cancer surgery.  She now has what may be a local recurrence or a second cancer in the same breast.  Recall she did not receive radiation previously and was not able to tolerate antiestrogens for any length of time.  Accordingly her risk of local recurrence was greater than usual.  She is now angry frustrated and unhappy with current developments.  We discussed why she had the seed placed the way it was and why she needs to go through the procedures that she does need to go through.  We reviewed the fact that our lab work here is really not that different from her primary doctors and that it  is not breast cancer specific.  Having said all that though I reassured her that her prognosis appears to be good and that I do not think she is likely to need chemotherapy to achieve that good prognosis.  Neck step is to proceed to definitive surgery.  I am not planning to repeat on Oncotype unless the tumor is much greater than 1cm.  If we do get an Oncotype I would expected to tell us that  she will not benefit from adjuvant chemotherapy as indeed was the case previously.  The question is how will we treat her systemically.  I think we would have to consider fulvestrant if she were agreeable to monthly injections.  That at least is generally well-tolerated except for the actual delivery of the drug.  We cannot use tamoxifen given her history of the factor V Leiden positivity.  I offered her an antidepressant but she tells me she is going to improve her mood by working in her garden in the next few days.  Will have a virtual visit a week after her surgery to discuss final results.  Total encounter time 35 minutes.*  Anjelo Pullman, Virgie Dad, MD  05/15/20 4:31 PM Medical Oncology and Hematology [facility] [place],  [address] Tel. [phone]    Fax. [phone]   I, Wilburn Mylar, am acting as scribe for Dr. Virgie Dad. Odetta Forness.  I, Lurline Del MD, have reviewed the above documentation for accuracy and completeness, and I agree with the above.   *Total Encounter Time as defined by the Centers for Medicare and Medicaid Services includes, in addition to the face-to-face time of a patient visit (documented in the note above) non-face-to-face time: obtaining and reviewing outside history, ordering and reviewing medications, tests or procedures, care coordination (communications with other health care professionals or caregivers) and documentation in the medical record.

## 2020-05-15 ENCOUNTER — Other Ambulatory Visit: Payer: Self-pay

## 2020-05-15 ENCOUNTER — Inpatient Hospital Stay: Payer: Medicare HMO

## 2020-05-15 ENCOUNTER — Inpatient Hospital Stay: Payer: Medicare HMO | Attending: Oncology | Admitting: Oncology

## 2020-05-15 VITALS — BP 138/72 | HR 106 | Temp 98.7°F | Resp 19 | Ht 64.0 in | Wt 125.0 lb

## 2020-05-15 DIAGNOSIS — J439 Emphysema, unspecified: Secondary | ICD-10-CM | POA: Insufficient documentation

## 2020-05-15 DIAGNOSIS — Z7951 Long term (current) use of inhaled steroids: Secondary | ICD-10-CM | POA: Diagnosis not present

## 2020-05-15 DIAGNOSIS — J438 Other emphysema: Secondary | ICD-10-CM

## 2020-05-15 DIAGNOSIS — I10 Essential (primary) hypertension: Secondary | ICD-10-CM | POA: Insufficient documentation

## 2020-05-15 DIAGNOSIS — Z79899 Other long term (current) drug therapy: Secondary | ICD-10-CM | POA: Diagnosis not present

## 2020-05-15 DIAGNOSIS — Z170 Estrogen receptor positive status [ER+]: Secondary | ICD-10-CM | POA: Diagnosis not present

## 2020-05-15 DIAGNOSIS — C50512 Malignant neoplasm of lower-outer quadrant of left female breast: Secondary | ICD-10-CM | POA: Insufficient documentation

## 2020-05-15 DIAGNOSIS — Z791 Long term (current) use of non-steroidal anti-inflammatories (NSAID): Secondary | ICD-10-CM | POA: Insufficient documentation

## 2020-05-15 DIAGNOSIS — I6521 Occlusion and stenosis of right carotid artery: Secondary | ICD-10-CM | POA: Diagnosis not present

## 2020-05-15 DIAGNOSIS — C3411 Malignant neoplasm of upper lobe, right bronchus or lung: Secondary | ICD-10-CM | POA: Diagnosis not present

## 2020-05-15 DIAGNOSIS — K449 Diaphragmatic hernia without obstruction or gangrene: Secondary | ICD-10-CM | POA: Insufficient documentation

## 2020-05-15 DIAGNOSIS — K900 Celiac disease: Secondary | ICD-10-CM | POA: Diagnosis not present

## 2020-05-15 DIAGNOSIS — D6851 Activated protein C resistance: Secondary | ICD-10-CM | POA: Diagnosis not present

## 2020-05-15 DIAGNOSIS — Z8673 Personal history of transient ischemic attack (TIA), and cerebral infarction without residual deficits: Secondary | ICD-10-CM | POA: Diagnosis not present

## 2020-05-15 DIAGNOSIS — Z801 Family history of malignant neoplasm of trachea, bronchus and lung: Secondary | ICD-10-CM | POA: Diagnosis not present

## 2020-05-15 DIAGNOSIS — Z87891 Personal history of nicotine dependence: Secondary | ICD-10-CM | POA: Diagnosis not present

## 2020-05-15 DIAGNOSIS — E785 Hyperlipidemia, unspecified: Secondary | ICD-10-CM | POA: Diagnosis not present

## 2020-05-15 DIAGNOSIS — Z803 Family history of malignant neoplasm of breast: Secondary | ICD-10-CM | POA: Insufficient documentation

## 2020-05-15 DIAGNOSIS — Z85118 Personal history of other malignant neoplasm of bronchus and lung: Secondary | ICD-10-CM | POA: Diagnosis not present

## 2020-05-15 LAB — CMP (CANCER CENTER ONLY)
ALT: 19 U/L (ref 0–44)
AST: 26 U/L (ref 15–41)
Albumin: 4.1 g/dL (ref 3.5–5.0)
Alkaline Phosphatase: 73 U/L (ref 38–126)
Anion gap: 12 (ref 5–15)
BUN: 10 mg/dL (ref 8–23)
CO2: 27 mmol/L (ref 22–32)
Calcium: 9.6 mg/dL (ref 8.9–10.3)
Chloride: 103 mmol/L (ref 98–111)
Creatinine: 0.84 mg/dL (ref 0.44–1.00)
GFR, Estimated: >60 mL/min (ref >60)
Glucose, Bld: 120 mg/dL — ABNORMAL HIGH (ref 70–99)
Potassium: 3.8 mmol/L (ref 3.5–5.1)
Sodium: 142 mmol/L (ref 135–145)
Total Bilirubin: 0.2 mg/dL — ABNORMAL LOW (ref 0.3–1.2)
Total Protein: 7.5 g/dL (ref 6.5–8.1)

## 2020-05-15 LAB — CBC WITH DIFFERENTIAL (CANCER CENTER ONLY)
Abs Immature Granulocytes: 0.03 10*3/uL (ref 0.00–0.07)
Basophils Absolute: 0 10*3/uL (ref 0.0–0.1)
Basophils Relative: 0 %
Eosinophils Absolute: 0.1 10*3/uL (ref 0.0–0.5)
Eosinophils Relative: 1 %
HCT: 41.4 % (ref 36.0–46.0)
Hemoglobin: 13.7 g/dL (ref 12.0–15.0)
Immature Granulocytes: 0 %
Lymphocytes Relative: 20 %
Lymphs Abs: 2.2 10*3/uL (ref 0.7–4.0)
MCH: 29.4 pg (ref 26.0–34.0)
MCHC: 33.1 g/dL (ref 30.0–36.0)
MCV: 88.8 fL (ref 80.0–100.0)
Monocytes Absolute: 0.9 10*3/uL (ref 0.1–1.0)
Monocytes Relative: 8 %
Neutro Abs: 7.7 10*3/uL (ref 1.7–7.7)
Neutrophils Relative %: 71 %
Platelet Count: 330 10*3/uL (ref 150–400)
RBC: 4.66 MIL/uL (ref 3.87–5.11)
RDW: 13.1 % (ref 11.5–15.5)
WBC Count: 10.9 10*3/uL — ABNORMAL HIGH (ref 4.0–10.5)
nRBC: 0 % (ref 0.0–0.2)

## 2020-05-17 ENCOUNTER — Telehealth: Payer: Self-pay | Admitting: Oncology

## 2020-05-17 NOTE — Telephone Encounter (Signed)
Scheduled appts per 11/30 los. Left voicemail with appt date and time.

## 2020-05-18 ENCOUNTER — Encounter: Payer: Self-pay | Admitting: *Deleted

## 2020-05-22 ENCOUNTER — Other Ambulatory Visit: Payer: Self-pay

## 2020-05-22 ENCOUNTER — Encounter (HOSPITAL_BASED_OUTPATIENT_CLINIC_OR_DEPARTMENT_OTHER): Payer: Self-pay | Admitting: General Surgery

## 2020-05-24 ENCOUNTER — Other Ambulatory Visit (HOSPITAL_COMMUNITY)
Admission: RE | Admit: 2020-05-24 | Discharge: 2020-05-24 | Disposition: A | Discharge status: Home / Self Care | Payer: Medicare HMO | Source: Ambulatory Visit | Attending: General Surgery | Admitting: General Surgery

## 2020-05-24 ENCOUNTER — Encounter (HOSPITAL_BASED_OUTPATIENT_CLINIC_OR_DEPARTMENT_OTHER)
Admission: RE | Admit: 2020-05-24 | Discharge: 2020-05-24 | Disposition: A | Discharge status: Home / Self Care | Payer: Medicare HMO | Source: Ambulatory Visit | Attending: General Surgery | Admitting: General Surgery

## 2020-05-24 DIAGNOSIS — Z20822 Contact with and (suspected) exposure to covid-19: Secondary | ICD-10-CM | POA: Diagnosis not present

## 2020-05-24 DIAGNOSIS — Z01810 Encounter for preprocedural cardiovascular examination: Secondary | ICD-10-CM | POA: Insufficient documentation

## 2020-05-24 DIAGNOSIS — Z01812 Encounter for preprocedural laboratory examination: Secondary | ICD-10-CM | POA: Insufficient documentation

## 2020-05-24 LAB — SARS CORONAVIRUS 2 (TAT 6-24 HRS): SARS Coronavirus 2: NEGATIVE

## 2020-05-24 NOTE — Progress Notes (Signed)

## 2020-05-25 DIAGNOSIS — C50512 Malignant neoplasm of lower-outer quadrant of left female breast: Secondary | ICD-10-CM | POA: Diagnosis not present

## 2020-05-28 ENCOUNTER — Encounter (HOSPITAL_BASED_OUTPATIENT_CLINIC_OR_DEPARTMENT_OTHER)
Admission: RE | Disposition: A | Discharge status: Home / Self Care | Payer: Self-pay | Source: Home / Self Care | Attending: General Surgery

## 2020-05-28 ENCOUNTER — Encounter (HOSPITAL_BASED_OUTPATIENT_CLINIC_OR_DEPARTMENT_OTHER): Payer: Self-pay | Admitting: General Surgery

## 2020-05-28 ENCOUNTER — Encounter (HOSPITAL_COMMUNITY)
Admission: RE | Admit: 2020-05-28 | Discharge: 2020-05-28 | Disposition: A | Discharge status: Home / Self Care | Payer: Medicare HMO | Source: Ambulatory Visit | Attending: General Surgery | Admitting: General Surgery

## 2020-05-28 ENCOUNTER — Ambulatory Visit (HOSPITAL_BASED_OUTPATIENT_CLINIC_OR_DEPARTMENT_OTHER): Payer: Medicare HMO | Admitting: Certified Registered"

## 2020-05-28 ENCOUNTER — Ambulatory Visit (HOSPITAL_BASED_OUTPATIENT_CLINIC_OR_DEPARTMENT_OTHER)
Admission: RE | Admit: 2020-05-28 | Discharge: 2020-05-28 | Disposition: A | Discharge status: Home / Self Care | Payer: Medicare HMO | Attending: General Surgery | Admitting: General Surgery

## 2020-05-28 ENCOUNTER — Other Ambulatory Visit: Payer: Self-pay

## 2020-05-28 DIAGNOSIS — Z8673 Personal history of transient ischemic attack (TIA), and cerebral infarction without residual deficits: Secondary | ICD-10-CM | POA: Insufficient documentation

## 2020-05-28 DIAGNOSIS — I10 Essential (primary) hypertension: Secondary | ICD-10-CM | POA: Diagnosis not present

## 2020-05-28 DIAGNOSIS — Z87891 Personal history of nicotine dependence: Secondary | ICD-10-CM | POA: Insufficient documentation

## 2020-05-28 DIAGNOSIS — C50512 Malignant neoplasm of lower-outer quadrant of left female breast: Secondary | ICD-10-CM | POA: Insufficient documentation

## 2020-05-28 DIAGNOSIS — G8918 Other acute postprocedural pain: Secondary | ICD-10-CM | POA: Diagnosis not present

## 2020-05-28 DIAGNOSIS — Z886 Allergy status to analgesic agent status: Secondary | ICD-10-CM | POA: Insufficient documentation

## 2020-05-28 DIAGNOSIS — Z885 Allergy status to narcotic agent status: Secondary | ICD-10-CM | POA: Insufficient documentation

## 2020-05-28 DIAGNOSIS — C50912 Malignant neoplasm of unspecified site of left female breast: Secondary | ICD-10-CM | POA: Diagnosis not present

## 2020-05-28 DIAGNOSIS — Z85118 Personal history of other malignant neoplasm of bronchus and lung: Secondary | ICD-10-CM | POA: Insufficient documentation

## 2020-05-28 DIAGNOSIS — Z170 Estrogen receptor positive status [ER+]: Secondary | ICD-10-CM | POA: Diagnosis not present

## 2020-05-28 DIAGNOSIS — Z803 Family history of malignant neoplasm of breast: Secondary | ICD-10-CM | POA: Diagnosis not present

## 2020-05-28 HISTORY — PX: BREAST LUMPECTOMY WITH RADIOACTIVE SEED AND SENTINEL LYMPH NODE BIOPSY: SHX6550

## 2020-05-28 SURGERY — BREAST LUMPECTOMY WITH RADIOACTIVE SEED AND SENTINEL LYMPH NODE BIOPSY
Anesthesia: General | Site: Breast | Laterality: Left

## 2020-05-28 MED ORDER — ONDANSETRON HCL 4 MG/2ML IJ SOLN
INTRAMUSCULAR | Status: AC
  Filled 2020-05-28: qty 8

## 2020-05-28 MED ORDER — BUPIVACAINE HCL (PF) 0.25 % IJ SOLN
INTRAMUSCULAR | Status: AC
  Filled 2020-05-28: qty 30

## 2020-05-28 MED ORDER — PROPOFOL 10 MG/ML IV BOLUS
INTRAVENOUS | Status: AC
  Filled 2020-05-28: qty 20

## 2020-05-28 MED ORDER — OXYCODONE HCL 5 MG PO TABS: 5.0000 mg | ORAL_TABLET | Freq: Once | ORAL | Status: DC | PRN

## 2020-05-28 MED ORDER — PHENYLEPHRINE HCL (PRESSORS) 10 MG/ML IV SOLN
INTRAVENOUS | Status: DC | PRN
  Administered 2020-05-28 (×3): 80 ug via INTRAVENOUS

## 2020-05-28 MED ORDER — EPHEDRINE 5 MG/ML INJ
INTRAVENOUS | Status: AC
  Filled 2020-05-28: qty 20

## 2020-05-28 MED ORDER — BUPIVACAINE-EPINEPHRINE (PF) 0.5% -1:200000 IJ SOLN
INTRAMUSCULAR | Status: DC | PRN
  Administered 2020-05-28: 30 mL

## 2020-05-28 MED ORDER — DEXAMETHASONE SODIUM PHOSPHATE 4 MG/ML IJ SOLN
INTRAMUSCULAR | Status: DC | PRN
  Administered 2020-05-28: 4 mg via INTRAVENOUS

## 2020-05-28 MED ORDER — MIDAZOLAM HCL 2 MG/2ML IJ SOLN
2.0000 mg | Freq: Once | INTRAMUSCULAR | Status: AC
  Administered 2020-05-28: 14:00:00 2 mg via INTRAVENOUS

## 2020-05-28 MED ORDER — LACTATED RINGERS IV SOLN: INTRAVENOUS | Status: DC

## 2020-05-28 MED ORDER — OXYCODONE HCL 5 MG/5ML PO SOLN: 5.0000 mg | Freq: Once | ORAL | Status: DC | PRN

## 2020-05-28 MED ORDER — FENTANYL CITRATE (PF) 100 MCG/2ML IJ SOLN: 25.0000 ug | INTRAMUSCULAR | Status: DC | PRN

## 2020-05-28 MED ORDER — MIDAZOLAM HCL 2 MG/2ML IJ SOLN
INTRAMUSCULAR | Status: AC
  Filled 2020-05-28: qty 2

## 2020-05-28 MED ORDER — PROPOFOL 10 MG/ML IV BOLUS
INTRAVENOUS | Status: DC | PRN
  Administered 2020-05-28: 150 mg via INTRAVENOUS

## 2020-05-28 MED ORDER — TRAMADOL HCL 50 MG PO TABS: 50.0000 mg | ORAL_TABLET | Freq: Four times a day (QID) | ORAL | 1 refills | Status: DC | PRN

## 2020-05-28 MED ORDER — CHLORHEXIDINE GLUCONATE CLOTH 2 % EX PADS: 6.0000 | MEDICATED_PAD | Freq: Once | CUTANEOUS | Status: DC

## 2020-05-28 MED ORDER — GABAPENTIN 300 MG PO CAPS
ORAL_CAPSULE | ORAL | Status: AC
  Filled 2020-05-28: qty 1

## 2020-05-28 MED ORDER — SODIUM CHLORIDE (PF) 0.9 % IJ SOLN
INTRAMUSCULAR | Status: AC
  Filled 2020-05-28: qty 10

## 2020-05-28 MED ORDER — ACETAMINOPHEN 500 MG PO TABS
ORAL_TABLET | ORAL | Status: AC
  Filled 2020-05-28: qty 2

## 2020-05-28 MED ORDER — PHENYLEPHRINE 40 MCG/ML (10ML) SYRINGE FOR IV PUSH (FOR BLOOD PRESSURE SUPPORT)
PREFILLED_SYRINGE | INTRAVENOUS | Status: AC
  Filled 2020-05-28: qty 30

## 2020-05-28 MED ORDER — ACETAMINOPHEN 500 MG PO TABS
1000.0000 mg | ORAL_TABLET | ORAL | Status: AC
  Administered 2020-05-28: 14:00:00 1000 mg via ORAL

## 2020-05-28 MED ORDER — FENTANYL CITRATE (PF) 100 MCG/2ML IJ SOLN
INTRAMUSCULAR | Status: DC | PRN
  Administered 2020-05-28: 50 ug via INTRAVENOUS

## 2020-05-28 MED ORDER — FENTANYL CITRATE (PF) 100 MCG/2ML IJ SOLN
100.0000 ug | Freq: Once | INTRAMUSCULAR | Status: AC
Start: 2020-05-28 — End: 2020-05-28
  Administered 2020-05-28: 14:00:00 100 ug via INTRAVENOUS

## 2020-05-28 MED ORDER — CEFAZOLIN SODIUM-DEXTROSE 2-4 GM/100ML-% IV SOLN
INTRAVENOUS | Status: AC
  Filled 2020-05-28: qty 100

## 2020-05-28 MED ORDER — LIDOCAINE 2% (20 MG/ML) 5 ML SYRINGE
INTRAMUSCULAR | Status: AC
  Filled 2020-05-28: qty 5

## 2020-05-28 MED ORDER — FENTANYL CITRATE (PF) 100 MCG/2ML IJ SOLN
INTRAMUSCULAR | Status: AC
  Filled 2020-05-28: qty 2

## 2020-05-28 MED ORDER — CEFAZOLIN SODIUM-DEXTROSE 2-4 GM/100ML-% IV SOLN
2.0000 g | INTRAVENOUS | Status: AC
  Administered 2020-05-28: 15:00:00 2 g via INTRAVENOUS

## 2020-05-28 MED ORDER — TECHNETIUM TC 99M TILMANOCEPT KIT
1.0000 | PACK | Freq: Once | INTRAVENOUS | Status: AC | PRN
  Administered 2020-05-28: 1 via INTRADERMAL

## 2020-05-28 MED ORDER — METHYLENE BLUE 0.5 % INJ SOLN
INTRAVENOUS | Status: AC
  Filled 2020-05-28: qty 10

## 2020-05-28 MED ORDER — AMISULPRIDE (ANTIEMETIC) 5 MG/2ML IV SOLN: 10.0000 mg | Freq: Once | INTRAVENOUS | Status: DC | PRN

## 2020-05-28 MED ORDER — GABAPENTIN 300 MG PO CAPS
300.0000 mg | ORAL_CAPSULE | ORAL | Status: AC
  Administered 2020-05-28: 14:00:00 300 mg via ORAL

## 2020-05-28 MED ORDER — DEXAMETHASONE SODIUM PHOSPHATE 10 MG/ML IJ SOLN
INTRAMUSCULAR | Status: AC
  Filled 2020-05-28: qty 1

## 2020-05-28 MED ORDER — ONDANSETRON HCL 4 MG/2ML IJ SOLN
INTRAMUSCULAR | Status: DC | PRN
  Administered 2020-05-28: 4 mg via INTRAVENOUS

## 2020-05-28 MED ORDER — BUPIVACAINE-EPINEPHRINE 0.25% -1:200000 IJ SOLN
INTRAMUSCULAR | Status: DC | PRN
  Administered 2020-05-28: 20 mL

## 2020-05-28 MED ORDER — LIDOCAINE HCL (CARDIAC) PF 100 MG/5ML IV SOSY
PREFILLED_SYRINGE | INTRAVENOUS | Status: DC | PRN
  Administered 2020-05-28: 60 mg via INTRAVENOUS

## 2020-05-28 MED ORDER — ONDANSETRON HCL 4 MG/2ML IJ SOLN: 4.0000 mg | Freq: Once | INTRAMUSCULAR | Status: DC | PRN

## 2020-05-28 SURGICAL SUPPLY — 42 items
APPLIER CLIP 9.375 MED OPEN (MISCELLANEOUS) ×3
BLADE SURG 15 STRL LF DISP TIS (BLADE) ×1 IMPLANT
BLADE SURG 15 STRL SS (BLADE) ×3
CANISTER SUC SOCK COL 7IN (MISCELLANEOUS) IMPLANT
CANISTER SUCT 1200ML W/VALVE (MISCELLANEOUS) IMPLANT
CHLORAPREP W/TINT 26 (MISCELLANEOUS) ×3 IMPLANT
CLIP APPLIE 9.375 MED OPEN (MISCELLANEOUS) ×1 IMPLANT
COVER BACK TABLE 60X90IN (DRAPES) ×3 IMPLANT
COVER MAYO STAND STRL (DRAPES) ×3 IMPLANT
COVER PROBE W GEL 5X96 (DRAPES) ×3 IMPLANT
COVER WAND RF STERILE (DRAPES) IMPLANT
DECANTER SPIKE VIAL GLASS SM (MISCELLANEOUS) IMPLANT
DERMABOND ADVANCED (GAUZE/BANDAGES/DRESSINGS) ×2
DERMABOND ADVANCED .7 DNX12 (GAUZE/BANDAGES/DRESSINGS) ×1 IMPLANT
DRAPE LAPAROSCOPIC ABDOMINAL (DRAPES) ×3 IMPLANT
DRAPE UTILITY XL STRL (DRAPES) ×3 IMPLANT
ELECT COATED BLADE 2.86 ST (ELECTRODE) ×3 IMPLANT
ELECT REM PT RETURN 9FT ADLT (ELECTROSURGICAL) ×3
ELECTRODE REM PT RTRN 9FT ADLT (ELECTROSURGICAL) ×1 IMPLANT
GLOVE BIO SURGEON STRL SZ7.5 (GLOVE) ×3 IMPLANT
GOWN STRL REUS W/ TWL LRG LVL3 (GOWN DISPOSABLE) ×2 IMPLANT
GOWN STRL REUS W/TWL LRG LVL3 (GOWN DISPOSABLE) ×6
ILLUMINATOR WAVEGUIDE N/F (MISCELLANEOUS) IMPLANT
KIT MARKER MARGIN INK (KITS) ×3 IMPLANT
LIGHT WAVEGUIDE WIDE FLAT (MISCELLANEOUS) IMPLANT
NDL SAFETY ECLIPSE 18X1.5 (NEEDLE) IMPLANT
NEEDLE HYPO 18GX1.5 SHARP (NEEDLE)
NEEDLE HYPO 25X1 1.5 SAFETY (NEEDLE) ×3 IMPLANT
NS IRRIG 1000ML POUR BTL (IV SOLUTION) IMPLANT
PACK BASIN DAY SURGERY FS (CUSTOM PROCEDURE TRAY) ×3 IMPLANT
PENCIL SMOKE EVACUATOR (MISCELLANEOUS) ×3 IMPLANT
SLEEVE SCD COMPRESS KNEE MED (MISCELLANEOUS) ×3 IMPLANT
SPONGE LAP 18X18 RF (DISPOSABLE) ×3 IMPLANT
SUT MON AB 4-0 PC3 18 (SUTURE) ×6 IMPLANT
SUT SILK 2 0 SH (SUTURE) IMPLANT
SUT VICRYL 3-0 CR8 SH (SUTURE) ×3 IMPLANT
SYR CONTROL 10ML LL (SYRINGE) ×3 IMPLANT
TOWEL GREEN STERILE FF (TOWEL DISPOSABLE) ×3 IMPLANT
TRAY FAXITRON CT DISP (TRAY / TRAY PROCEDURE) ×3 IMPLANT
TUBE CONNECTING 20'X1/4 (TUBING)
TUBE CONNECTING 20X1/4 (TUBING) IMPLANT
YANKAUER SUCT BULB TIP NO VENT (SUCTIONS) IMPLANT

## 2020-05-28 NOTE — Discharge Instructions (Signed)
Next dose of Tylenol at 8 PM   Post Anesthesia Home Care Instructions  Activity: Get plenty of rest for the remainder of the day. A responsible individual must stay with you for 24 hours following the procedure.  For the next 24 hours, DO NOT: -Drive a car -Paediatric nurse -Drink alcoholic beverages -Take any medication unless instructed by your physician -Make any legal decisions or sign important papers.  Meals: Start with liquid foods such as gelatin or soup. Progress to regular foods as tolerated. Avoid greasy, spicy, heavy foods. If nausea and/or vomiting occur, drink only clear liquids until the nausea and/or vomiting subsides. Call your physician if vomiting continues.  Special Instructions/Symptoms: Your throat may feel dry or sore from the anesthesia or the breathing tube placed in your throat during surgery. If this causes discomfort, gargle with warm salt water. The discomfort should disappear within 24 hours.  If you had a scopolamine patch placed behind your ear for the management of post- operative nausea and/or vomiting:  1. The medication in the patch is effective for 72 hours, after which it should be removed.  Wrap patch in a tissue and discard in the trash. Wash hands thoroughly with soap and water. 2. You may remove the patch earlier than 72 hours if you experience unpleasant side effects which may include dry mouth, dizziness or visual disturbances. 3. Avoid touching the patch. Wash your hands with soap and water after contact with the patch.

## 2020-05-28 NOTE — H&P (Signed)
Jessica Gilbert  Location: [facility] Patient #: [id number] DOB: September 30, 1945 Divorced / Language: Cleophus Molt / Race: White Female   History of Present Illness  The patient is a 74 year old female who presents for a follow-up for Breast cancer. The patient is a 74 year old white female who is about 4 years status post left breast lumpectomy for breast cancer. At that time she was not able to receive radiation because of radiation for lung cancer. She did not tolerate anti-estrogens for the full 5 years. She recently went for a routine screening mammogram at which time she was found to have a new 5 mm mass in the lower outer quadrant of the left breast with clinically negative nodes. The mass was biopsied and came back as invasive ductal cancer that was ER and PR positive and HER-2 negative with a Ki-67 of 25%. She does have a history of surgery and radiation for lung cancer. She does have some shortness of breath but is not on home oxygen. She does not smoke.   Past Surgical History Breast Biopsy  Left. Breast Mass; Local Excision  Left. Cataract Surgery  Right. Colon Polyp Removal - Colonoscopy  Lung Surgery  Right. Oral Surgery   Diagnostic Studies History  Colonoscopy  1-5 years ago 5-10 years ago Mammogram  within last year Pap Smear  >5 years ago  Allergies  Aspirin *ANALGESICS - NonNarcotic*  Codeine Phosphate *ANALGESICS - OPIOID*  Allergies Reconciled   Medication History  Advil (100MG Tablet Chewable, Oral) Active. Multivitamin Women (Oral) Active. Telmisartan (40MG Tablet, Oral) Active. Albuterol Sulfate (108 (90 Base)MCG/ACT Aero Pow Br Act, Inhalation) Active. Medications Reconciled  Social History Alcohol use  Occasional alcohol use, Recently quit alcohol use. Caffeine use  Coffee. No drug use  Tobacco use  Former smoker.  Family History  Breast Cancer  Sister. Colon Polyps  Brother, Mother, Son. Diabetes Mellitus   Brother, Father, Sister. Hypertension  Brother, Father, Sister. Respiratory Condition  Brother, Sister.  Pregnancy / Birth History Age at menarche  89 years. Age of menopause  18-50 47-55 Gravida  2 Maternal age  17-25 15-20 Para  2  Other Problems Breast Cancer  Cerebrovascular Accident  Chronic Obstructive Lung Disease  High blood pressure  Hypercholesterolemia  Lump In Breast  Lung Cancer     Review of Systems  General Not Present- Appetite Loss, Chills, Fatigue, Fever, Night Sweats, Weight Gain and Weight Loss. Skin Not Present- Change in Wart/Mole, Dryness, Hives, Jaundice, New Lesions, Non-Healing Wounds, Rash and Ulcer. HEENT Present- Seasonal Allergies and Wears glasses/contact lenses. Not Present- Earache, Hearing Loss, Hoarseness, Nose Bleed, Oral Ulcers, Ringing in the Ears, Sinus Pain, Sore Throat, Visual Disturbances and Yellow Eyes. Respiratory Not Present- Bloody sputum, Chronic Cough, Difficulty Breathing, Snoring and Wheezing. Breast Present- Breast Mass. Not Present- Breast Pain, Nipple Discharge and Skin Changes. Cardiovascular Not Present- Chest Pain, Difficulty Breathing Lying Down, Leg Cramps, Palpitations, Rapid Heart Rate, Shortness of Breath and Swelling of Extremities. Gastrointestinal Not Present- Abdominal Pain, Bloating, Bloody Stool, Change in Bowel Habits, Chronic diarrhea, Constipation, Difficulty Swallowing, Excessive gas, Gets full quickly at meals, Hemorrhoids, Indigestion, Nausea, Rectal Pain and Vomiting. Female Genitourinary Not Present- Frequency, Nocturia, Painful Urination, Pelvic Pain and Urgency. Musculoskeletal Not Present- Back Pain, Joint Pain, Joint Stiffness, Muscle Pain, Muscle Weakness and Swelling of Extremities. Neurological Not Present- Decreased Memory, Fainting, Headaches, Numbness, Seizures, Tingling, Tremor, Trouble walking and Weakness. Psychiatric Not Present- Anxiety, Bipolar, Change in Sleep Pattern,  Depression, Fearful and Frequent  crying. Endocrine Not Present- Cold Intolerance, Excessive Hunger, Hair Changes, Heat Intolerance, Hot flashes and New Diabetes. Hematology Not Present- Blood Thinners, Easy Bruising, Excessive bleeding, Gland problems, HIV and Persistent Infections.  Vitals  Weight: 126.5 lb Height: 64in Body Surface Area: 1.61 m Body Mass Index: 21.71 kg/m  Temp.: 97.51F  Pulse: 112 (Regular)        Physical Exam  General Mental Status-Alert. General Appearance-Consistent with stated age. Hydration-Well hydrated. Voice-Normal.  Head and Neck Head-normocephalic, atraumatic with no lesions or palpable masses. Trachea-midline. Thyroid Gland Characteristics - normal size and consistency.  Eye Eyeball - Bilateral-Extraocular movements intact. Sclera/Conjunctiva - Bilateral-No scleral icterus.  Chest and Lung Exam Chest and lung exam reveals -quiet, even and easy respiratory effort with no use of accessory muscles and on auscultation, normal breath sounds, no adventitious sounds and normal vocal resonance. Inspection Chest Wall - Normal. Back - normal.  Breast Note: There is no palpable mass in either breast. There is no palpable axillary, supraclavicular, or cervical lymphadenopathy.   Cardiovascular Cardiovascular examination reveals -normal heart sounds, regular rate and rhythm with no murmurs and normal pedal pulses bilaterally.  Abdomen Inspection Inspection of the abdomen reveals - No Hernias. Skin - Scar - no surgical scars. Palpation/Percussion Palpation and Percussion of the abdomen reveal - Soft, Non Tender, No Rebound tenderness, No Rigidity (guarding) and No hepatosplenomegaly. Auscultation Auscultation of the abdomen reveals - Bowel sounds normal.  Neurologic Neurologic evaluation reveals -alert and oriented x 3 with no impairment of recent or remote memory. Mental  Status-Normal.  Musculoskeletal Normal Exam - Left-Upper Extremity Strength Normal and Lower Extremity Strength Normal. Normal Exam - Right-Upper Extremity Strength Normal and Lower Extremity Strength Normal.  Lymphatic Head & Neck  General Head & Neck Lymphatics: Bilateral - Description - Normal. Axillary  General Axillary Region: Bilateral - Description - Normal. Tenderness - Non Tender. Femoral & Inguinal  Generalized Femoral & Inguinal Lymphatics: Bilateral - Description - Normal. Tenderness - Non Tender.    Assessment & Plan  MALIGNANT NEOPLASM OF LOWER-OUTER QUADRANT OF LEFT BREAST OF FEMALE, ESTROGEN RECEPTOR POSITIVE (C50.512) Impression: The patient is about 4 years status post left breast lumpectomy for breast cancer. On her recent mammogram she has a new 5 mm cancer in the lower outer quadrant of the left breast. Clinically her nodes are negative. I have discussed with her in detail the different options for treatment and at this point she favors breast conservation. She would still be a candidate for sentinel node biopsy. I have discussed with her in detail the risks and benefits of the operation as well as some on the technical aspects including the use of a radioactive seed for localization and she understands and wishes to proceed. She will likely not receive radiation therapy given her history of radiation for lung cancer. I will refer her back to medical and radiation oncology to discuss the options and then proceed with surgery scheduling. This patient encounter took 30 minutes today to perform the following: take history, perform exam, review outside records, interpret imaging, counsel the patient on their diagnosis and document encounter, findings & plan in the EHR Current Plans Referred to Oncology, for evaluation and follow up (Oncology). Routine.

## 2020-05-28 NOTE — Anesthesia Procedure Notes (Signed)
Anesthesia Regional Block: Pectoralis block   Pre-Anesthetic Checklist: ,, timeout performed, Correct Patient, Correct Site, Correct Laterality, Correct Procedure, Correct Position, site marked, Risks and benefits discussed,  Surgical consent,  Pre-op evaluation,  At surgeon's request and post-op pain management  Laterality: Left  Prep: chloraprep       Needles:  Injection technique: Single-shot     Needle Length: 9cm  Needle Gauge: 21     Additional Needles:   Narrative:  Start time: 05/28/2020 2:13 PM End time: 05/28/2020 2:23 PM Injection made incrementally with aspirations every 5 mL.  Performed by: Personally  Anesthesiologist: Lillia Abed, MD  Additional Notes: Monitors applied. Patient sedated. Sterile prep and drape,hand hygiene and sterile gloves were used. Relevant anatomy identified.Needle position confirmed.Local anesthetic injected incrementally after negative aspiration. Local anesthetic spread visualized. Vascular puncture avoided. No complications. Image printed for medical record.The patient tolerated the procedure well.

## 2020-05-28 NOTE — Progress Notes (Signed)
Assisted Dr. Conrad [place] with left, ultrasound guided, pectoralis block. Side rails up, monitors on throughout procedure. See vital signs in flow sheet. Tolerated Procedure well.

## 2020-05-28 NOTE — Anesthesia Procedure Notes (Signed)
Procedure Name: LMA Insertion Date/Time: 05/28/2020 2:48 PM Performed by: Signe Colt, CRNA Pre-anesthesia Checklist: Patient identified, Emergency Drugs available, Suction available and Patient being monitored Patient Re-evaluated:Patient Re-evaluated prior to induction Oxygen Delivery Method: Circle System Utilized Preoxygenation: Pre-oxygenation with 100% oxygen Induction Type: IV induction Ventilation: Mask ventilation without difficulty LMA: LMA inserted LMA Size: 4.0 Number of attempts: 1 Airway Equipment and Method: bite block Placement Confirmation: positive ETCO2 Tube secured with: Tape Dental Injury: Teeth and Oropharynx as per pre-operative assessment

## 2020-05-28 NOTE — Anesthesia Preprocedure Evaluation (Signed)
Anesthesia Evaluation  Patient identified by MRN, date of birth, ID band Patient awake    Reviewed: Allergy & Precautions, NPO status , Patient's Chart, lab work & pertinent test results  Airway Mallampati: III       Dental   Pulmonary former smoker,    Pulmonary exam normal        Cardiovascular hypertension, Pt. on medications Normal cardiovascular exam     Neuro/Psych CVA    GI/Hepatic   Endo/Other    Renal/GU      Musculoskeletal   Abdominal   Peds  Hematology   Anesthesia Other Findings   Reproductive/Obstetrics                             Anesthesia Physical Anesthesia Plan  ASA: III  Anesthesia Plan: General   Post-op Pain Management:    Induction: Intravenous  PONV Risk Score and Plan: 3  Airway Management Planned: LMA  Additional Equipment:   Intra-op Plan:   Post-operative Plan: Extubation in OR  Informed Consent: I have reviewed the patients History and Physical, chart, labs and discussed the procedure including the risks, benefits and alternatives for the proposed anesthesia with the patient or authorized representative who has indicated his/her understanding and acceptance.       Plan Discussed with: CRNA and Surgeon  Anesthesia Plan Comments:         Anesthesia Quick Evaluation

## 2020-05-28 NOTE — Transfer of Care (Signed)
Immediate Anesthesia Transfer of Care Note  Patient: Jessica Gilbert  Procedure(s) Performed: LEFT BREAST LUMPECTOMY WITH RADIOACTIVE SEED AND SENTINEL LYMPH NODE BIOPSY (Left Breast)  Patient Location: PACU  Anesthesia Type:GA combined with regional for post-op pain  Level of Consciousness: drowsy and patient cooperative  Airway & Oxygen Therapy: Patient Spontanous Breathing and Patient connected to face mask oxygen  Post-op Assessment: Report given to RN and Post -op Vital signs reviewed and stable  Post vital signs: Reviewed and stable  Last Vitals:  Vitals Value Taken Time  BP 140/66 05/28/20 1558  Temp 36.4 C 05/28/20 1558  Pulse 95 05/28/20 1559  Resp 19 05/28/20 1559  SpO2 100 % 05/28/20 1559  Vitals shown include unvalidated device data.  Last Pain:  Vitals:   05/28/20 1341  TempSrc: Oral  PainSc: 0-No pain         Complications: No complications documented.

## 2020-05-28 NOTE — Interval H&P Note (Signed)
History and Physical Interval Note:  05/28/2020 2:10 PM  Jessica Gilbert  has presented today for surgery, with the diagnosis of LEFT BREAST CANCER.  The various methods of treatment have been discussed with the patient and family. After consideration of risks, benefits and other options for treatment, the patient has consented to  Procedure(s): LEFT BREAST LUMPECTOMY WITH RADIOACTIVE SEED AND SENTINEL LYMPH NODE BIOPSY (Left) as a surgical intervention.  The patient's history has been reviewed, patient examined, no change in status, stable for surgery.  I have reviewed the patient's chart and labs.  Questions were answered to the patient's satisfaction.     [name]

## 2020-05-28 NOTE — Op Note (Signed)
05/28/2020  3:51 PM  PATIENT:  Jessica Gilbert  74 y.o. female  PRE-OPERATIVE DIAGNOSIS:  LEFT BREAST CANCER  POST-OPERATIVE DIAGNOSIS:  LEFT BREAST CANCER  PROCEDURE:  Procedure(s): LEFT BREAST LUMPECTOMY WITH RADIOACTIVE SEED LOCALIZATION AND DEEP LEFT AXILLARY SENTINEL LYMPH NODE BIOPSY (Left)  SURGEON:  Surgeon(s) and Role:    * Jovita Kussmaul, MD - Primary  PHYSICIAN ASSISTANT:   ASSISTANTS: none   ANESTHESIA:   local and general  EBL:  minimal   BLOOD ADMINISTERED:none  DRAINS: none   LOCAL MEDICATIONS USED:  MARCAINE     SPECIMEN:  Source of Specimen:  left breast tissue and sentinel node  DISPOSITION OF SPECIMEN:  PATHOLOGY  COUNTS:  YES  TOURNIQUET:  * No tourniquets in log *  DICTATION: .Dragon Dictation   After informed consent was obtained the patient was brought to the operating room and placed in the supine position on the operating table.  After adequate induction of general anesthesia the patient's left chest, breast, and axillary area were prepped with ChloraPrep, allowed to dry, and draped in usual sterile manner.  An appropriate timeout was performed.  Previously an I-125 seed was placed in the lower outer quadrant of the left breast to mark an area of invasive breast cancer.  Also earlier in the day the patient underwent injection 1 mCi of technetium sulfur colloid in the subareolar position on the left.  Attention was first turned to the left axilla.  The neoprobe was set to technetium and an area of radioactivity was readily identified.  A small transversely oriented incision was made with a 15 blade knife overlying the area of radioactivity.  This area had already been infiltrated with quarter percent Marcaine.  The incision was carried through the skin and subcutaneous tissue sharply with the electrocautery until the deep left axillary space was entered.  The neoprobe was then used direct blunt hemostat dissection.  I was able to identify a hot lymph  node.  This was excised sharply with the electrocautery and the surrounding small vessels and lymphatics were controlled with clips.  Ex vivo counts on this node were approximately 800.  No other hot or palpable nodes were identified in the left axilla.  This node was sent to pathology as sentinel node #1.  The deep layer of the wound was then closed with interrupted 3-0 Vicryl stitches.  The skin was closed with a running 4-0 Monocryl subcuticular stitch.  Attention was then turned to the left breast.  The neoprobe was set to I-125 in the area of radioactivity was readily identified in the lower outer quadrant.  The signal from the seed was very superficial to the skin.  Because of this I elected to make a radially oriented elliptical incision in the skin overlying the area of radioactivity.  The incision was carried through the skin and subcutaneous tissue sharply with the electrocautery.  Dissection was then carried out widely around the radioactive seed while checking the area of radioactivity frequently.  This dissection was carried all the way to the chest wall.  Once the specimen was removed it was oriented with the appropriate paint colors.  A specimen radiograph was obtained that showed the clip and seed to be near the center of the specimen.  The specimen was then sent to pathology for further evaluation.  Hemostasis was achieved using the Bovie electrocautery.  The wound was irrigated with saline and infiltrated with more quarter percent Marcaine.  The deep layer of the wound  was then closed with layers of interrupted 3-0 Vicryl stitches.  The skin was closed with a running 4-0 Monocryl subcuticular stitch.  Dermabond dressings were applied.  The patient tolerated the procedure well.  At the end of the case all needle sponge and instrument counts were correct.  The patient was then awakened and taken recovery in stable condition.  PLAN OF CARE: Discharge to home after PACU  PATIENT DISPOSITION:  PACU  - hemodynamically stable.   Delay start of Pharmacological VTE agent (>24hrs) due to surgical blood loss or risk of bleeding: not applicable

## 2020-05-29 ENCOUNTER — Encounter (HOSPITAL_BASED_OUTPATIENT_CLINIC_OR_DEPARTMENT_OTHER): Payer: Self-pay | Admitting: General Surgery

## 2020-05-29 NOTE — Anesthesia Postprocedure Evaluation (Signed)
Anesthesia Post Note  Patient: Jessica Gilbert  Procedure(s) Performed: LEFT BREAST LUMPECTOMY WITH RADIOACTIVE SEED AND SENTINEL LYMPH NODE BIOPSY (Left Breast)     Patient location during evaluation: PACU Anesthesia Type: General Level of consciousness: awake and alert Pain management: pain level controlled Vital Signs Assessment: post-procedure vital signs reviewed and stable Respiratory status: spontaneous breathing, nonlabored ventilation and respiratory function stable Cardiovascular status: blood pressure returned to baseline and stable Postop Assessment: no apparent nausea or vomiting Anesthetic complications: no   No complications documented.  Last Vitals:  Vitals:   05/28/20 1645 05/28/20 1657  BP: 125/75 132/66  Pulse: 92 96  Resp: 13 16  Temp:  (!) 36.3 C  SpO2: 96% 94%    Last Pain:  Vitals:   05/28/20 1657  TempSrc:   PainSc: 0-No pain   Pain Goal:                   Lidia Collum

## 2020-05-30 ENCOUNTER — Encounter: Payer: Self-pay | Admitting: Oncology

## 2020-05-30 LAB — SURGICAL PATHOLOGY

## 2020-06-01 ENCOUNTER — Encounter: Payer: Self-pay | Admitting: *Deleted

## 2020-06-03 NOTE — Progress Notes (Signed)
[place]  [phone] Fax:([phone]) [phone]     ID: Jessica Gilbert DOB: 1946/05/07  MR#: [id number]  FVC#:[id number]  Patient Care Team: [name], MD as PCP - General (Family Medicine) Rena Sweeden, Virgie Dad, MD as Consulting Physician (Oncology) [name], MD as Attending Physician (Radiation Oncology) Jovita Kussmaul, MD as Consulting Physician (General Surgery) Mauro Kaufmann, RN as Oncology Nurse Navigator Rockwell Germany, RN as Oncology Nurse Navigator OTHER MD:  I connected with Jessica Gilbert on 06/04/20 at  3:00 PM EST by telephone visit and verified that I am speaking with the correct person using two identifiers.   I discussed the limitations, risks, security and privacy concerns of performing an evaluation and management service by telemedicine and the availability of in-person appointments. I also discussed with the patient that there may be a patient responsible charge related to this service. The patient expressed understanding and agreed to proceed.   Other persons participating in the visit and their role in the encounter: None  Patient's location: Home Provider's location: [address]:  Estrogen receptor positive breast cancer  CURRENT TREATMENT: To start Faslodex   INTERVAL HISTORY: Jessica Gilbert was contacted today for follow-up of her estrogen receptor positive breast cancer.    Since her last visit, she underwent left lumpectomy on 05/28/2020 with Dr. Marlou Starks. Pathology from the procedure [id number]) showed: invasive ductal carcinoma, 0.7 cm, grade 2; margins not involved.  The biopsied lymph node was negative for carcinoma.   REVIEW OF SYSTEMS: Jessica Gilbert tells me she did pretty well with the surgery.  She does have burning or stinging along the incision, but she says no redness or opening up of the incision.  She has a little bit of swelling in the armpit again with no redness.  She has tried ice for  the discomfort but did not work very well.  I suggested she could consider Tylenol.  She tells me she does not have an appointment with her surgeon until mid January.  A detailed review of systems today was otherwise stable   COVID 19 VACCINATION STATUS:    BREAST CANCER HISTORY: From the original intake note:   Jessica Gilbert (pronounced "ro-lund") had bilateral screening mammography at the [facility] Center 05/26/2014 showing a possible mass in the left breast. Left digital mammography with ultrasonography 06/14/2014 found a breast density to be category C. There was no persistent mass or distortion or malignant-type microcalcifications. . There was no palpable mass. Ultrasound showed a well circumscribed benign appearing nodule in the left breast 2:00 position measuring 4 mm. This was felt to be most likely benign but repeat ultrasound  At 6 months was recommended and thiswas performed 12/14/2014.  The mass was unchanged an incidental note was made of a benign appearing 0.5 cm lymph node in the left breast 2:00 position.   On 06/15/2015 Jessica Gilbert had bilateral diagnostic mammography with tomosynthesis and this time an irregular mass with spiculated margins was noted in the outer lower left breast measuring approximately 9 mm. Physical examination confirmed a firm fixed nodule at the 3:30 o'clock position an ultrasound of this area confirmed an irregular hypoechoic mass measuring 0.7 cm. There was a 0.2 cm adjacent satellite nodule. There was no left axillary lymphadenopathy.   biopsy of this mass was obtained 06/15/2015, and showed ([id number]) an invasive ductal carcinoma, grade 1, estrogen receptor 100% positive, progesterone receptor 40% positive, both with strong staining intensity, with an MIB-1 of  11%, and no HER-2 amplification, the signals ratio being 1.39 and the number per cell 2.15.   Jessica Gilbert also has a history of prior tobacco abuse, status post right upper lobectomy for lung cancer, with  significant emphysema.   The patient's subsequent history is as detailed below   PAST MEDICAL HISTORY: Past Medical History:  Diagnosis Date  . Acute respiratory failure ([place])   . Breast cancer ([place])   . Carotid stenosis, right   . Celiac disease   . Emphysema lung ([place])   . History of hiatal hernia   . Human metapneumovirus pneumonia March 2015  . Hyperlipidemia   . Hypertension   . Lung cancer ([place]) 2005   Rt upper lobe  . Stroke ([place])    TIA w/o defecits    PAST SURGICAL HISTORY: Past Surgical History:  Procedure Laterality Date  . BREAST LUMPECTOMY     left   . BREAST LUMPECTOMY WITH RADIOACTIVE SEED AND SENTINEL LYMPH NODE BIOPSY Left 07/17/2015   Procedure: LEFT BREAST LUMPECTOMY WITH RADIOACTIVE SEED WITH LEFT AXILLARY SENTINEL LYMPH NODE BIOPSY;  Surgeon: [name], MD;  Location: [facility];  Service: General;  Laterality: Left;  . BREAST LUMPECTOMY WITH RADIOACTIVE SEED AND SENTINEL LYMPH NODE BIOPSY Left 05/28/2020   Procedure: LEFT BREAST LUMPECTOMY WITH RADIOACTIVE SEED AND SENTINEL LYMPH NODE BIOPSY;  Surgeon: Jovita Kussmaul, MD;  Location: [facility];  Service: General;  Laterality: Left;  . LUNG REMOVAL, PARTIAL Right 2005   Upper lobe  . TRIGGER FINGER RELEASE    . TUBAL LIGATION  1980    FAMILY HISTORY Family History  Problem Relation Age of Onset  . Heart disease Father   . High blood pressure Father   . Congestive Heart Failure Father   . Leukemia Mother   . Colon polyps Mother   . High blood pressure Brother   . Lung cancer Brother   . Heart disease Brother   . Diabetes Brother   . Colon polyps Sister   . Breast cancer Sister   . High blood pressure Sister   . Emphysema Sister   . Pancreatic cancer Paternal Uncle    the patient's father died at the age of 6 with congestive 40 failure. Patient's mother died with acute leukemia at the age of 88. The patient had one brother, 2 sisters. One sister had breast  cancer at age 52. One brother, smoker, was diagnosed with lung cancer at the age of 56. There is a paternal uncle who may have had pancreatic cancer. There is no history of ovarian cancer in the family.   GYNECOLOGIC HISTORY:  No LMP recorded. Patient is postmenopausal.  menarche age 51, first live birth age 15. The patient is GX P2. She stopped having periods in 1998, took hormone replacement approximately 6 months. Remotely she took oral contraceptives about 3 years with no complications   SOCIAL HISTORY: (updated July 2021)  Jessica Gilbert worked as an Optometrist.  She describes herself is single. At home she lives by herself, with 3 cats. Her son Sinaya Minogue lives in [place] and was recently promoted to Tax adviser in Event organiser. The patient's other son died 22 years ago. Naliya has 2 grandchildren, a Geographical information systems officer who is a Conservation officer, nature at [employer] and a 82 y/o grandson. She is a Tourist information centre manager.    ADVANCED DIRECTIVES:  In place. Her son [name] is her healthcare power of attorney. He can be reached at [place]: Social History  Tobacco Use  . Smoking status: Former Smoker    Packs/day: 1.00    Years: 35.00    Pack years: 35.00    Types: Cigarettes    Quit date: 01/29/2004    Years since quitting: 16.3  . Smokeless tobacco: Never Used  Vaping Use  . Vaping Use: Never used  Substance Use Topics  . Alcohol use: Yes    Alcohol/week: 0.0 standard drinks    Comment: wine occasionally  . Drug use: No     Colonoscopy:  PAP:  Bone density: remote.   Lipid panel:  Allergies  Allergen Reactions  . Aspirin Other (See Comments)    "burning stomach"  . Codeine Nausea Only  . Vitamin D Analogs Other (See Comments)    Current Outpatient Medications  Medication Sig Dispense Refill  . albuterol (PROVENTIL HFA;VENTOLIN HFA) 108 (90 BASE) MCG/ACT inhaler Inhale 2 puffs into the lungs every 6 (six) hours as needed for wheezing or shortness of breath. 1 Inhaler 3  .  diphenhydrAMINE (BENADRYL) 25 MG tablet Take 25 mg by mouth every 8 (eight) hours as needed.    Marland Kitchen guaiFENesin (MUCINEX) 600 MG 12 hr tablet Take by mouth 2 (two) times daily.    Marland Kitchen ibuprofen (ADVIL,MOTRIN) 200 MG tablet Take 200 mg by mouth every 6 (six) hours as needed.    . methylPREDNISolone (MEDROL DOSEPAK) 4 MG TBPK tablet Take all tablets at breakfast with food. Day one: take 6 tablets Day two: take 5 tablets Day three: take 4 tablets Day four:take 3 tablets Day five: take two tablets Day six: take one tablet 21 tablet 0  . mometasone-formoterol (DULERA) 200-5 MCG/ACT AERO Inhale 2 puffs into the lungs 2 (two) times daily. 1 Inhaler 5  . Multiple Vitamin (MULTIVITAMIN WITH MINERALS) TABS tablet Take 1 tablet by mouth daily.    Marland Kitchen telmisartan (MICARDIS) 40 MG tablet Take 40 mg by mouth daily.    . traMADol (ULTRAM) 50 MG tablet Take 1-2 tablets (50-100 mg total) by mouth every 6 (six) hours as needed. 20 tablet 1   No current facility-administered medications for this visit.    OBJECTIVE:   There were no vitals filed for this visit.   There is no height or weight on file to calculate BMI.    ECOG FS:  Telemedicine visit 06/04/2020    LAB RESULTS:  CMP     Component Value Date/Time   NA 142 05/15/2020 1559   NA 140 10/08/2016 1152   K 3.8 05/15/2020 1559   K 4.3 10/08/2016 1152   CL 103 05/15/2020 1559   CO2 27 05/15/2020 1559   CO2 25 10/08/2016 1152   GLUCOSE 120 (H) 05/15/2020 1559   GLUCOSE 98 10/08/2016 1152   BUN 10 05/15/2020 1559   BUN 12.4 10/08/2016 1152   CREATININE 0.84 05/15/2020 1559   CREATININE 0.8 10/08/2016 1152   CALCIUM 9.6 05/15/2020 1559   CALCIUM 9.7 10/08/2016 1152   PROT 7.5 05/15/2020 1559   PROT 7.1 10/08/2016 1152   ALBUMIN 4.1 05/15/2020 1559   ALBUMIN 4.1 10/08/2016 1152   AST 26 05/15/2020 1559   AST 23 10/08/2016 1152   ALT 19 05/15/2020 1559   ALT 18 10/08/2016 1152   ALKPHOS 73 05/15/2020 1559   ALKPHOS 75 10/08/2016 1152    BILITOT 0.2 (L) 05/15/2020 1559   BILITOT 0.42 10/08/2016 1152   GFRNONAA >60 05/15/2020 1559   GFRAA >60 01/09/2020 1503    INo results found for: SPEP, UPEP  Lab Results  Component Value Date   WBC 10.9 (H) 05/15/2020   NEUTROABS 7.7 05/15/2020   HGB 13.7 05/15/2020   HCT 41.4 05/15/2020   MCV 88.8 05/15/2020   PLT 330 05/15/2020      Chemistry      Component Value Date/Time   NA 142 05/15/2020 1559   NA 140 10/08/2016 1152   K 3.8 05/15/2020 1559   K 4.3 10/08/2016 1152   CL 103 05/15/2020 1559   CO2 27 05/15/2020 1559   CO2 25 10/08/2016 1152   BUN 10 05/15/2020 1559   BUN 12.4 10/08/2016 1152   CREATININE 0.84 05/15/2020 1559   CREATININE 0.8 10/08/2016 1152      Component Value Date/Time   CALCIUM 9.6 05/15/2020 1559   CALCIUM 9.7 10/08/2016 1152   ALKPHOS 73 05/15/2020 1559   ALKPHOS 75 10/08/2016 1152   AST 26 05/15/2020 1559   AST 23 10/08/2016 1152   ALT 19 05/15/2020 1559   ALT 18 10/08/2016 1152   BILITOT 0.2 (L) 05/15/2020 1559   BILITOT 0.42 10/08/2016 1152       No results found for: LABCA2  No components found for: LABCA125  No results for input(s): INR in the last 168 hours.  Urinalysis No results found for: COLORURINE, APPEARANCEUR, LABSPEC, PHURINE, GLUCOSEU, HGBUR, BILIRUBINUR, KETONESUR, PROTEINUR, UROBILINOGEN, NITRITE, LEUKOCYTESUR   STUDIES: NM Sentinel Node Inj-No Rpt (Breast)  Result Date: 05/28/2020 Sulfur colloid was injected by the nuclear medicine technologist for melanoma sentinel node.     ASSESSMENT: 74 y.o.  [place] woman status post left breast lower outer quadrant biopsy 06/15/2015 for a clinical mT1c N0, stage IA  Invasive ductal carcinoma, grade 1, estrogen and progesterone receptor positive, HER-2 not amplified, with an MIB-1 of 11%  (1) status post left lumpectomy with sentinel lymph node sampling 07/17/2015 for a pT1b pN0, stage IA invasive ductal carcinoma, grade 2, with negative though close margins;  repeat HER-2 was again negative, and estrogen and progesterone receptors were again positive  (2) Oncotype DX score of 16 predicts an outside the breast risk of recurrence within 10 years of 10% if the patient's only systemic therapy is tamoxifen for 5 years. It also her next no benefit from chemotherapy  (3)  The patient opted against adjuvant radiation  (4)  Anastrozole started 09/07/2015 discontinued May 2017 with intolerance  (a) exemestane started June 2017, discontinued August 2017 with intolerance.  (b) bone density at the [facility] 11/05/2015 shows a T score of -2.0   (5) Factor V Leiden positive: elevated clotting risk with any procedure, immobilization, or procoagulant drugs such as estrogen  (6) s/p right upper lobectomy (after neoadjuvant chemo-radiation) August  2005 for a primary lung cancer: Non-small cell carcinoma, with a 4 cm area of tumor regression and necrosis and rare atypical cells but no obvious viable tumor remaining, and a total of 9 regional lymph nodes (N1, and 2) all negative.  (7) status post left breast upper outer quadrant 05/01/2020 shows invasive ductal carcinoma, low-grade, estrogen and progesterone receptor positive, HER-2 not amplified, with an MIB-1 of 25%  (8) status post left lumpectomy and sentinel lymph node sampling 05/28/2020 for a pT1b pN0, stage IA invasive ductal carcinoma, grade 2, with negative margins.  (a) a single left axillary lymph node removed  (9) Faslodex to start 07/13/2019  (a) patient is intolerant of aromatase inhibitors and tamoxifen is contraindicated (#5)  PLAN:  Rashan did well with her recent surgery.  She is having a  bit of discomfort but apparently no dehiscence or erythema.  She does not think she needs to see her surgeon at this point but she was encouraged to if she has any new developments.  The tumor was less than a centimeter.  We do not need to send an Oncotype.  She will not need chemotherapy.  She will however  benefit from systemic treatment.  She was not able to tolerate aromatase inhibitors, either steroidal or nonsteroidal.  She is not a candidate for tamoxifen because she carries the factor V Leiden gene which puts her at high risk for clots  Accordingly we are going to consider Faslodex.  Today we discussed this in a preliminary fashion.  She will see me again 07/13/2019 and if she agrees she will receive the first dose that same day  Recall she has already had chest wall radiation for her lung cancer and does not believe she can have any further radiation treatments.    Daniyal Tabor, Virgie Dad, MD  06/04/20 3:18 PM Medical Oncology and Hematology [facility] [place], [place] [address] Tel. [phone]    Fax. [phone]   I, Wilburn Mylar, am acting as scribe for Dr. Virgie Dad. Korie Streat.  I, Lurline Del MD, have reviewed the above documentation for accuracy and completeness, and I agree with the above.   *Total Encounter Time as defined by the Centers for Medicare and Medicaid Services includes, in addition to the face-to-face time of a patient visit (documented in the note above) non-face-to-face time: obtaining and reviewing outside history, ordering and reviewing medications, tests or procedures, care coordination (communications with other health care professionals or caregivers) and documentation in the medical record.

## 2020-06-04 ENCOUNTER — Inpatient Hospital Stay: Payer: Medicare HMO | Attending: Oncology | Admitting: Oncology

## 2020-06-04 DIAGNOSIS — J438 Other emphysema: Secondary | ICD-10-CM | POA: Diagnosis not present

## 2020-06-04 DIAGNOSIS — C50912 Malignant neoplasm of unspecified site of left female breast: Secondary | ICD-10-CM

## 2020-06-04 DIAGNOSIS — C3411 Malignant neoplasm of upper lobe, right bronchus or lung: Secondary | ICD-10-CM

## 2020-06-04 DIAGNOSIS — Z170 Estrogen receptor positive status [ER+]: Secondary | ICD-10-CM | POA: Diagnosis not present

## 2020-06-04 DIAGNOSIS — D6851 Activated protein C resistance: Secondary | ICD-10-CM | POA: Insufficient documentation

## 2020-06-04 DIAGNOSIS — C50512 Malignant neoplasm of lower-outer quadrant of left female breast: Secondary | ICD-10-CM | POA: Diagnosis not present

## 2020-06-05 ENCOUNTER — Encounter: Payer: Self-pay | Admitting: *Deleted

## 2020-06-06 ENCOUNTER — Telehealth: Payer: Self-pay | Admitting: Oncology

## 2020-06-06 NOTE — Telephone Encounter (Signed)
Scheduled appts per 12/20 los. Pt confirmed appt date and time.

## 2020-06-18 ENCOUNTER — Encounter: Payer: Self-pay | Admitting: Oncology

## 2020-06-21 ENCOUNTER — Encounter: Payer: Self-pay | Admitting: Oncology

## 2020-07-10 ENCOUNTER — Other Ambulatory Visit: Payer: Self-pay | Admitting: *Deleted

## 2020-07-10 ENCOUNTER — Other Ambulatory Visit: Payer: Self-pay

## 2020-07-10 DIAGNOSIS — C3411 Malignant neoplasm of upper lobe, right bronchus or lung: Secondary | ICD-10-CM

## 2020-07-10 DIAGNOSIS — Z170 Estrogen receptor positive status [ER+]: Secondary | ICD-10-CM

## 2020-07-10 DIAGNOSIS — C50512 Malignant neoplasm of lower-outer quadrant of left female breast: Secondary | ICD-10-CM

## 2020-07-11 NOTE — Progress Notes (Signed)
[facility]  [phone] [phone]     ID: AHLANI WICKES DOB: June 12, 1946  MR#: [id number]  UGQ#:[id number]  Patient Care Team: [name], MD as PCP - General (Family Medicine) Haziel Molner, Virgie Dad, MD as Consulting Physician (Oncology) [name], MD as Attending Physician (Radiation Oncology) Jovita Kussmaul, MD as Consulting Physician (General Surgery) Mauro Kaufmann, RN as Oncology Nurse Navigator Rockwell Germany, RN as Oncology Nurse Navigator OTHER MD:   CHIEF COMPLAINT:  Estrogen receptor positive breast cancer  CURRENT TREATMENT: Observation   INTERVAL HISTORY: [name] returns today for follow-up of her estrogen receptor positive breast cancer.    She is scheduled to begin Faslodex today.  However she has been doing some reading.  She does not think she is going to be able to tolerate this and wanted to know what would happen if she did not receive antiestrogens.  This is discussed further below.   REVIEW OF SYSTEMS: [name] is feeling a bit tired.  She tells me her sister was diagnosed with Covid but is doing well.  She thinks she has gained a little bit of weight.  A detailed review of systems today was otherwise stable   COVID 19 VACCINATION STATUS: Status post Moderna x2, no booster as of 07/12/2020   BREAST CANCER HISTORY: From the original intake note:   Jessica Gilbert (pronounced "ro-lund") had bilateral screening mammography at the [facility] 05/26/2014 showing a possible mass in the left breast. Left digital mammography with ultrasonography 06/14/2014 found a breast density to be category C. There was no persistent mass or distortion or malignant-type microcalcifications. . There was no palpable mass. Ultrasound showed a well circumscribed benign appearing nodule in the left breast 2:00 position measuring 4 mm. This was felt to be most likely benign but repeat ultrasound  At 6 months was recommended and thiswas performed  12/14/2014.  The mass was unchanged an incidental note was made of a benign appearing 0.5 cm lymph node in the left breast 2:00 position.   On 06/15/2015 Jessica Gilbert had bilateral diagnostic mammography with tomosynthesis and this time an irregular mass with spiculated margins was noted in the outer lower left breast measuring approximately 9 mm. Physical examination confirmed a firm fixed nodule at the 3:30 o'clock position an ultrasound of this area confirmed an irregular hypoechoic mass measuring 0.7 cm. There was a 0.2 cm adjacent satellite nodule. There was no left axillary lymphadenopathy.   biopsy of this mass was obtained 06/15/2015, and showed ([id number]) an invasive ductal carcinoma, grade 1, estrogen receptor 100% positive, progesterone receptor 40% positive, both with strong staining intensity, with an MIB-1 of 11%, and no HER-2 amplification, the signals ratio being 1.39 and the number per cell 2.15.   Margee also has a history of prior tobacco abuse, status post right upper lobectomy for lung cancer, with significant emphysema.   The patient's subsequent history is as detailed below   PAST MEDICAL HISTORY: Past Medical History:  Diagnosis Date  . Acute respiratory failure ([place])   . Breast cancer ([place])   . Carotid stenosis, right   . Celiac disease   . Emphysema lung ([place])   . History of hiatal hernia   . Human metapneumovirus pneumonia March 2015  . Hyperlipidemia   . Hypertension   . Lung cancer ([place]) 2005   Rt upper lobe  . Stroke ([place])    TIA w/o defecits    PAST SURGICAL HISTORY: Past Surgical History:  Procedure Laterality Date  .  BREAST LUMPECTOMY     left   . BREAST LUMPECTOMY WITH RADIOACTIVE SEED AND SENTINEL LYMPH NODE BIOPSY Left 07/17/2015   Procedure: LEFT BREAST LUMPECTOMY WITH RADIOACTIVE SEED WITH LEFT AXILLARY SENTINEL LYMPH NODE BIOPSY;  Surgeon: [name], MD;  Location: [facility];  Service: General;  Laterality: Left;  . BREAST  LUMPECTOMY WITH RADIOACTIVE SEED AND SENTINEL LYMPH NODE BIOPSY Left 05/28/2020   Procedure: LEFT BREAST LUMPECTOMY WITH RADIOACTIVE SEED AND SENTINEL LYMPH NODE BIOPSY;  Surgeon: Jovita Kussmaul, MD;  Location: [facility];  Service: General;  Laterality: Left;  . LUNG REMOVAL, PARTIAL Right 2005   Upper lobe  . TRIGGER FINGER RELEASE    . TUBAL LIGATION  1980    FAMILY HISTORY Family History  Problem Relation Age of Onset  . Heart disease Father   . High blood pressure Father   . Congestive Heart Failure Father   . Leukemia Mother   . Colon polyps Mother   . High blood pressure Brother   . Lung cancer Brother   . Heart disease Brother   . Diabetes Brother   . Colon polyps Sister   . Breast cancer Sister   . High blood pressure Sister   . Emphysema Sister   . Pancreatic cancer Paternal Uncle    the patient's father died at the age of 58 with congestive 40 failure. Patient's mother died with acute leukemia at the age of 41. The patient had one brother, 2 sisters. One sister had breast cancer at age 21. One brother, smoker, was diagnosed with lung cancer at the age of 69. There is a paternal uncle who may have had pancreatic cancer. There is no history of ovarian cancer in the family.   GYNECOLOGIC HISTORY:  No LMP recorded. Patient is postmenopausal.  menarche age 31, first live birth age 61. The patient is GX P2. She stopped having periods in 1998, took hormone replacement approximately 6 months. Remotely she took oral contraceptives about 3 years with no complications   SOCIAL HISTORY: (updated July 2021)  Jessica Gilbert worked as an Optometrist.  She describes herself is single. At home she lives by herself, with 3 cats. Her son Jessica Gilbert lives in [place] and was recently promoted to Tax adviser in Event organiser. The patient's other son died 28 years ago. Jessica Gilbert has 2 grandchildren, a Geographical information systems officer who is a Conservation officer, nature at [employer] and a 73 y/o grandson. She is a  Tourist information centre manager.    ADVANCED DIRECTIVES:  In place. Her son Jessica Gilbert is her healthcare power of attorney. He can be reached at [place]: Social History   Tobacco Use  . Smoking status: Former Smoker    Packs/day: 1.00    Years: 35.00    Pack years: 35.00    Types: Cigarettes    Quit date: 01/29/2004    Years since quitting: 16.4  . Smokeless tobacco: Never Used  Vaping Use  . Vaping Use: Never used  Substance Use Topics  . Alcohol use: Yes    Alcohol/week: 0.0 standard drinks    Comment: wine occasionally  . Drug use: No     Colonoscopy:  PAP:  Bone density: remote.   Lipid panel:  Allergies  Allergen Reactions  . Aspirin Other (See Comments)    "burning stomach"  . Codeine Nausea Only  . Vitamin D Analogs Other (See Comments)    Current Outpatient Medications  Medication Sig Dispense Refill  . albuterol (PROVENTIL HFA;VENTOLIN HFA) 108 (  90 BASE) MCG/ACT inhaler Inhale 2 puffs into the lungs every 6 (six) hours as needed for wheezing or shortness of breath. 1 Inhaler 3  . diphenhydrAMINE (BENADRYL) 25 MG tablet Take 25 mg by mouth every 8 (eight) hours as needed.    Marland Kitchen guaiFENesin (MUCINEX) 600 MG 12 hr tablet Take by mouth 2 (two) times daily.    Marland Kitchen ibuprofen (ADVIL,MOTRIN) 200 MG tablet Take 200 mg by mouth every 6 (six) hours as needed.    . methylPREDNISolone (MEDROL DOSEPAK) 4 MG TBPK tablet Take all tablets at breakfast with food. Day one: take 6 tablets Day two: take 5 tablets Day three: take 4 tablets Day four:take 3 tablets Day five: take two tablets Day six: take one tablet 21 tablet 0  . mometasone-formoterol (DULERA) 200-5 MCG/ACT AERO Inhale 2 puffs into the lungs 2 (two) times daily. 1 Inhaler 5  . Multiple Vitamin (MULTIVITAMIN WITH MINERALS) TABS tablet Take 1 tablet by mouth daily.    Marland Kitchen telmisartan (MICARDIS) 40 MG tablet Take 40 mg by mouth daily.    . traMADol (ULTRAM) 50 MG tablet Take 1-2 tablets (50-100 mg total) by mouth every  6 (six) hours as needed. 20 tablet 1   No current facility-administered medications for this visit.    OBJECTIVE: White woman in no acute distress  Vitals:   07/12/20 1456  BP: 136/74  Pulse: 93  Resp: 18  Temp: 97.6 F (36.4 C)  SpO2: 96%     Body mass index is 20.6 kg/m.    ECOG FS:  Sclerae unicteric, EOMs intact Wearing a mask No cervical or supraclavicular adenopathy Lungs no rales or rhonchi Heart regular rate and rhythm Abd soft, nontender, positive bowel sounds MSK no focal spinal tenderness, no upper extremity lymphedema Neuro: nonfocal, well oriented, appropriate affect Breasts: The right breast is benign.  The left breast is status post biopsy x2.  There is no evidence of residual or recurrent disease.  Both axillae are benign.   LAB RESULTS:  CMP     Component Value Date/Time   NA 142 05/15/2020 1559   NA 140 10/08/2016 1152   K 3.8 05/15/2020 1559   K 4.3 10/08/2016 1152   CL 103 05/15/2020 1559   CO2 27 05/15/2020 1559   CO2 25 10/08/2016 1152   GLUCOSE 120 (H) 05/15/2020 1559   GLUCOSE 98 10/08/2016 1152   BUN 10 05/15/2020 1559   BUN 12.4 10/08/2016 1152   CREATININE 0.84 05/15/2020 1559   CREATININE 0.8 10/08/2016 1152   CALCIUM 9.6 05/15/2020 1559   CALCIUM 9.7 10/08/2016 1152   PROT 7.5 05/15/2020 1559   PROT 7.1 10/08/2016 1152   ALBUMIN 4.1 05/15/2020 1559   ALBUMIN 4.1 10/08/2016 1152   AST 26 05/15/2020 1559   AST 23 10/08/2016 1152   ALT 19 05/15/2020 1559   ALT 18 10/08/2016 1152   ALKPHOS 73 05/15/2020 1559   ALKPHOS 75 10/08/2016 1152   BILITOT 0.2 (L) 05/15/2020 1559   BILITOT 0.42 10/08/2016 1152   GFRNONAA >60 05/15/2020 1559   GFRAA >60 01/09/2020 1503    INo results found for: SPEP, UPEP  Lab Results  Component Value Date   WBC 10.6 (H) 07/12/2020   NEUTROABS 8.0 (H) 07/12/2020   HGB 14.3 07/12/2020   HCT 43.9 07/12/2020   MCV 89.4 07/12/2020   PLT 322 07/12/2020      Chemistry      Component Value  Date/Time   NA 142 05/15/2020 1559  NA 140 10/08/2016 1152   K 3.8 05/15/2020 1559   K 4.3 10/08/2016 1152   CL 103 05/15/2020 1559   CO2 27 05/15/2020 1559   CO2 25 10/08/2016 1152   BUN 10 05/15/2020 1559   BUN 12.4 10/08/2016 1152   CREATININE 0.84 05/15/2020 1559   CREATININE 0.8 10/08/2016 1152      Component Value Date/Time   CALCIUM 9.6 05/15/2020 1559   CALCIUM 9.7 10/08/2016 1152   ALKPHOS 73 05/15/2020 1559   ALKPHOS 75 10/08/2016 1152   AST 26 05/15/2020 1559   AST 23 10/08/2016 1152   ALT 19 05/15/2020 1559   ALT 18 10/08/2016 1152   BILITOT 0.2 (L) 05/15/2020 1559   BILITOT 0.42 10/08/2016 1152       No results found for: LABCA2  No components found for: LABCA125  No results for input(s): INR in the last 168 hours.  Urinalysis No results found for: COLORURINE, APPEARANCEUR, LABSPEC, PHURINE, GLUCOSEU, HGBUR, BILIRUBINUR, KETONESUR, PROTEINUR, UROBILINOGEN, NITRITE, LEUKOCYTESUR   STUDIES: No results found.   ASSESSMENT: 75 y.o.   woman status post left breast lower outer quadrant biopsy 06/15/2015 for a clinical mT1c N0, stage IA  Invasive ductal carcinoma, grade 1, estrogen and progesterone receptor positive, HER-2 not amplified, with an MIB-1 of 11%  (1) status post left lumpectomy with sentinel lymph node sampling 07/17/2015 for a pT1b pN0, stage IA invasive ductal carcinoma, grade 2, with negative though close margins; repeat HER-2 was again negative, and estrogen and progesterone receptors were again positive  (2) Oncotype DX score of 16 predicts an outside the breast risk of recurrence within 10 years of 10% if the patient's only systemic therapy is tamoxifen for 5 years. It also her next no benefit from chemotherapy  (3)  The patient opted against adjuvant radiation  (4)  Anastrozole started 09/07/2015 discontinued May 2017 with intolerance  (a) exemestane started June 2017, discontinued August 2017 with intolerance.  (b) bone density  at the [facility] 11/05/2015 shows a T score of -2.0   (5) Factor V Leiden positive: elevated clotting risk with any procedure, immobilization, or procoagulant drugs such as estrogen  (6) s/p right upper lobectomy (after neoadjuvant chemo-radiation) August  2005 for a primary lung cancer: Non-small cell carcinoma, with a 4 cm area of tumor regression and necrosis and rare atypical cells but no obvious viable tumor remaining, and a total of 9 regional lymph nodes (N1, and 2) all negative.  (7) status post left breast upper outer quadrant 05/01/2020 shows invasive ductal carcinoma, low-grade, estrogen and progesterone receptor positive, HER-2 not amplified, with an MIB-1 of 25%  (8) status post left lumpectomy and sentinel lymph node sampling 05/28/2020 for a pT1b pN0, stage IA invasive ductal carcinoma, grade 2, with negative margins.  (a) a single left axillary lymph node removed  (b) status post chemoradiation for right upper lobectomy, making adjuvant radiation for her breast cancer problematic  (9) fulvestrant to start 07/13/2019, but patient opted against  (a) patient is intolerant of aromatase inhibitors and tamoxifen is contraindicated (#5)   PLAN:  Kylei has pretty much decided that she does not want any antiestrogens.  We discussed the aromatase inhibitors which she tried previously and did not tolerate.  We discussed tamoxifen and we are of course concerned about issues regarding clotting.  She has been reading up on fulvestrant and after reading the possible side effects as she does not think she wants that medication.  She understands she does not have  to have any of the side effects that she named, most of my patients on tramadol really complain only about the actual injection process.  The risk of clots with tramadol is quite low.  Nevertheless Kareena pretty much is decided that she does not want to put herself through these treatments.  She wanted to know if her prognosis was very  dire or generally good.  Actually her prognosis is pretty good.  She had small tumors node-negative tumors which were not high-grade.  Her prognosis would be better by some percentage points with antiestrogens but I could not give her a specific number.  I think her risk of recurrence overall is perhaps as high as 20 % without further treatment.  This is not motivating to her  Accordingly we are going for observation alone.  She has be density breast so I do not think she needs MRIs and I do not have data for doing by annual mammography in the setting.  Her next mammogram will be in November and she will see me shortly after that  Total encounter time 25 minutes.*   Cadyn Fann, Virgie Dad, MD  07/12/20 3:03 PM Medical Oncology and Hematology [facility] [place], [place] [address] Tel. [phone]    Fax. [phone]   I, Wilburn Mylar, am acting as scribe for Dr. Virgie Dad. Shalia Bartko.  I, Lurline Del MD, have reviewed the above documentation for accuracy and completeness, and I agree with the above.   *Total Encounter Time as defined by the Centers for Medicare and Medicaid Services includes, in addition to the face-to-face time of a patient visit (documented in the note above) non-face-to-face time: obtaining and reviewing outside history, ordering and reviewing medications, tests or procedures, care coordination (communications with other health care professionals or caregivers) and documentation in the medical record.

## 2020-07-12 ENCOUNTER — Inpatient Hospital Stay: Payer: Medicare HMO

## 2020-07-12 ENCOUNTER — Inpatient Hospital Stay (HOSPITAL_BASED_OUTPATIENT_CLINIC_OR_DEPARTMENT_OTHER): Payer: Medicare HMO | Admitting: Oncology

## 2020-07-12 ENCOUNTER — Other Ambulatory Visit: Payer: Self-pay

## 2020-07-12 ENCOUNTER — Inpatient Hospital Stay: Payer: Medicare HMO | Attending: Oncology

## 2020-07-12 VITALS — BP 136/74 | HR 93 | Temp 97.6°F | Resp 18 | Ht 64.0 in | Wt 120.0 lb

## 2020-07-12 DIAGNOSIS — Z170 Estrogen receptor positive status [ER+]: Secondary | ICD-10-CM | POA: Diagnosis not present

## 2020-07-12 DIAGNOSIS — C3411 Malignant neoplasm of upper lobe, right bronchus or lung: Secondary | ICD-10-CM

## 2020-07-12 DIAGNOSIS — I10 Essential (primary) hypertension: Secondary | ICD-10-CM

## 2020-07-12 DIAGNOSIS — Z85118 Personal history of other malignant neoplasm of bronchus and lung: Secondary | ICD-10-CM | POA: Diagnosis not present

## 2020-07-12 DIAGNOSIS — D6851 Activated protein C resistance: Secondary | ICD-10-CM | POA: Diagnosis not present

## 2020-07-12 DIAGNOSIS — C50512 Malignant neoplasm of lower-outer quadrant of left female breast: Secondary | ICD-10-CM | POA: Insufficient documentation

## 2020-07-12 DIAGNOSIS — Z9221 Personal history of antineoplastic chemotherapy: Secondary | ICD-10-CM | POA: Insufficient documentation

## 2020-07-12 DIAGNOSIS — Z79899 Other long term (current) drug therapy: Secondary | ICD-10-CM | POA: Insufficient documentation

## 2020-07-12 DIAGNOSIS — Z923 Personal history of irradiation: Secondary | ICD-10-CM | POA: Insufficient documentation

## 2020-07-12 DIAGNOSIS — Z87891 Personal history of nicotine dependence: Secondary | ICD-10-CM | POA: Diagnosis not present

## 2020-07-12 DIAGNOSIS — K900 Celiac disease: Secondary | ICD-10-CM | POA: Insufficient documentation

## 2020-07-12 DIAGNOSIS — C50912 Malignant neoplasm of unspecified site of left female breast: Secondary | ICD-10-CM | POA: Diagnosis not present

## 2020-07-12 DIAGNOSIS — J439 Emphysema, unspecified: Secondary | ICD-10-CM | POA: Diagnosis not present

## 2020-07-12 DIAGNOSIS — E785 Hyperlipidemia, unspecified: Secondary | ICD-10-CM | POA: Insufficient documentation

## 2020-07-12 DIAGNOSIS — J438 Other emphysema: Secondary | ICD-10-CM | POA: Diagnosis not present

## 2020-07-12 DIAGNOSIS — Z8673 Personal history of transient ischemic attack (TIA), and cerebral infarction without residual deficits: Secondary | ICD-10-CM | POA: Diagnosis not present

## 2020-07-12 LAB — CBC WITH DIFFERENTIAL (CANCER CENTER ONLY)
Abs Immature Granulocytes: 0.03 10*3/uL (ref 0.00–0.07)
Basophils Absolute: 0 10*3/uL (ref 0.0–0.1)
Basophils Relative: 0 %
Eosinophils Absolute: 0 10*3/uL (ref 0.0–0.5)
Eosinophils Relative: 0 %
HCT: 43.9 % (ref 36.0–46.0)
Hemoglobin: 14.3 g/dL (ref 12.0–15.0)
Immature Granulocytes: 0 %
Lymphocytes Relative: 17 %
Lymphs Abs: 1.8 10*3/uL (ref 0.7–4.0)
MCH: 29.1 pg (ref 26.0–34.0)
MCHC: 32.6 g/dL (ref 30.0–36.0)
MCV: 89.4 fL (ref 80.0–100.0)
Monocytes Absolute: 0.8 10*3/uL (ref 0.1–1.0)
Monocytes Relative: 7 %
Neutro Abs: 8 10*3/uL — ABNORMAL HIGH (ref 1.7–7.7)
Neutrophils Relative %: 76 %
Platelet Count: 322 10*3/uL (ref 150–400)
RBC: 4.91 MIL/uL (ref 3.87–5.11)
RDW: 12.9 % (ref 11.5–15.5)
WBC Count: 10.6 10*3/uL — ABNORMAL HIGH (ref 4.0–10.5)
nRBC: 0 % (ref 0.0–0.2)

## 2020-07-12 LAB — CMP (CANCER CENTER ONLY)
ALT: 20 U/L (ref 0–44)
AST: 27 U/L (ref 15–41)
Albumin: 4.2 g/dL (ref 3.5–5.0)
Alkaline Phosphatase: 77 U/L (ref 38–126)
Anion gap: 10 (ref 5–15)
BUN: 17 mg/dL (ref 8–23)
CO2: 28 mmol/L (ref 22–32)
Calcium: 9.5 mg/dL (ref 8.9–10.3)
Chloride: 104 mmol/L (ref 98–111)
Creatinine: 0.96 mg/dL (ref 0.44–1.00)
GFR, Estimated: >60 mL/min (ref >60)
Glucose, Bld: 120 mg/dL — ABNORMAL HIGH (ref 70–99)
Potassium: 4.5 mmol/L (ref 3.5–5.1)
Sodium: 142 mmol/L (ref 135–145)
Total Bilirubin: 0.3 mg/dL (ref 0.3–1.2)
Total Protein: 7.7 g/dL (ref 6.5–8.1)

## 2020-11-20 ENCOUNTER — Encounter: Payer: Self-pay | Admitting: Oncology

## 2021-01-01 DIAGNOSIS — K900 Celiac disease: Secondary | ICD-10-CM | POA: Diagnosis not present

## 2021-01-01 DIAGNOSIS — Z85118 Personal history of other malignant neoplasm of bronchus and lung: Secondary | ICD-10-CM | POA: Diagnosis not present

## 2021-01-01 DIAGNOSIS — J449 Chronic obstructive pulmonary disease, unspecified: Secondary | ICD-10-CM | POA: Diagnosis not present

## 2021-01-01 DIAGNOSIS — D6851 Activated protein C resistance: Secondary | ICD-10-CM | POA: Diagnosis not present

## 2021-01-01 DIAGNOSIS — M8580 Other specified disorders of bone density and structure, unspecified site: Secondary | ICD-10-CM | POA: Diagnosis not present

## 2021-01-01 DIAGNOSIS — I10 Essential (primary) hypertension: Secondary | ICD-10-CM | POA: Diagnosis not present

## 2021-01-01 DIAGNOSIS — E7800 Pure hypercholesterolemia, unspecified: Secondary | ICD-10-CM | POA: Diagnosis not present

## 2021-01-01 DIAGNOSIS — Z8673 Personal history of transient ischemic attack (TIA), and cerebral infarction without residual deficits: Secondary | ICD-10-CM | POA: Diagnosis not present

## 2021-01-09 DIAGNOSIS — Z1389 Encounter for screening for other disorder: Secondary | ICD-10-CM | POA: Diagnosis not present

## 2021-01-09 DIAGNOSIS — Z0000 Encounter for general adult medical examination without abnormal findings: Secondary | ICD-10-CM | POA: Diagnosis not present

## 2021-02-11 DIAGNOSIS — C50512 Malignant neoplasm of lower-outer quadrant of left female breast: Secondary | ICD-10-CM | POA: Diagnosis not present

## 2021-02-11 DIAGNOSIS — Z170 Estrogen receptor positive status [ER+]: Secondary | ICD-10-CM | POA: Diagnosis not present

## 2021-03-04 DIAGNOSIS — H52223 Regular astigmatism, bilateral: Secondary | ICD-10-CM | POA: Diagnosis not present

## 2021-03-23 DIAGNOSIS — Z23 Encounter for immunization: Secondary | ICD-10-CM | POA: Diagnosis not present

## 2021-04-24 ENCOUNTER — Other Ambulatory Visit: Payer: Self-pay | Admitting: *Deleted

## 2021-04-24 DIAGNOSIS — Z170 Estrogen receptor positive status [ER+]: Secondary | ICD-10-CM

## 2021-04-24 DIAGNOSIS — C50512 Malignant neoplasm of lower-outer quadrant of left female breast: Secondary | ICD-10-CM

## 2021-04-24 NOTE — Progress Notes (Signed)
[place]  [phone] Fax:([phone]     ID: Jessica Gilbert DOB: [birth year]  MR#: [id number]  ENI#:[id number]  Patient Care Team: [name], MD as PCP - General (Family Medicine) Maraki Macquarrie, Virgie Dad, MD as Consulting Physician (Oncology) [name], MD as Attending Physician (Radiation Oncology) Jovita Kussmaul, MD as Consulting Physician (General Surgery) Mauro Kaufmann, RN as Oncology Nurse Navigator Rockwell Germany, RN as Oncology Nurse Navigator OTHER MD:   CHIEF COMPLAINT:  Estrogen receptor positive breast cancer  CURRENT TREATMENT: Observation   INTERVAL HISTORY: Jessica Gilbert returns today for follow-up of her estrogen receptor positive breast cancer. She continues under observation.  She tells me she is doing "good".  She is scheduled for a bilateral diagnostic mammography at [facility] 05/08/2021.  REVIEW OF SYSTEMS: Jessica Gilbert has lost 10 pounds over the last 10 months.  She is not trying to lose weight.  She has what I would consider a good diet and she cooks her food herself.  She does have celiac disease so she has to be somewhat careful.  Recall she lives by herself.  She says she is tired frequently but manages to walk at least 30 minutes most days and also does a good bit of yard work.  Sometimes she has a little pain in the right upper quadrant which she attributes to gas.  This comes up suddenly, last maybe an hour or 2 and then goes with no particular intervention.  She says her bowel movements currently are now normal.  She is a little short of breath some days not others but generally this is very stable a detailed review of systems was otherwise benign today   COVID 19 VACCINATION STATUS: Status post Moderna x4,   BREAST CANCER HISTORY: From the original intake note:   Jessica Gilbert (pronounced "ro-lund") had bilateral screening mammography at the [facility] Center 05/26/2014 showing a possible mass in the left breast. Left digital  mammography with ultrasonography 06/14/2014 found a breast density to be category C. There was no persistent mass or distortion or malignant-type microcalcifications. . There was no palpable mass. Ultrasound showed a well circumscribed benign appearing nodule in the left breast 2:00 position measuring 4 mm. This was felt to be most likely benign but repeat ultrasound  At 6 months was recommended and thiswas performed 12/14/2014.  The mass was unchanged an incidental note was made of a benign appearing 0.5 cm lymph node in the left breast 2:00 position.   On 06/15/2015 Jessica Gilbert had bilateral diagnostic mammography with tomosynthesis and this time an irregular mass with spiculated margins was noted in the outer lower left breast measuring approximately 9 mm. Physical examination confirmed a firm fixed nodule at the 3:30 o'clock position an ultrasound of this area confirmed an irregular hypoechoic mass measuring 0.7 cm. There was a 0.2 cm adjacent satellite nodule. There was no left axillary lymphadenopathy.   biopsy of this mass was obtained 06/15/2015, and showed ([id number]) an invasive ductal carcinoma, grade 1, estrogen receptor 100% positive, progesterone receptor 40% positive, both with strong staining intensity, with an MIB-1 of 11%, and no HER-2 amplification, the signals ratio being 1.39 and the number per cell 2.15.   Suhana also has a history of prior tobacco abuse, status post right upper lobectomy for lung cancer, with significant emphysema.   The patient's subsequent history is as detailed below   PAST MEDICAL HISTORY: Past Medical History:  Diagnosis Date   Acute respiratory failure ([place])  Breast cancer ([place])    Carotid stenosis, right    Celiac disease    Emphysema lung ([facility])    History of hiatal hernia    Human metapneumovirus pneumonia March 2015   Hyperlipidemia    Hypertension    Lung cancer ([place]) 2005   Rt upper lobe   Stroke ([place])    TIA w/o defecits    PAST SURGICAL  HISTORY: Past Surgical History:  Procedure Laterality Date   BREAST LUMPECTOMY     left    BREAST LUMPECTOMY WITH RADIOACTIVE SEED AND SENTINEL LYMPH NODE BIOPSY Left 07/17/2015   Procedure: LEFT BREAST LUMPECTOMY WITH RADIOACTIVE SEED WITH LEFT AXILLARY SENTINEL LYMPH NODE BIOPSY;  Surgeon: [name], MD;  Location: [facility];  Service: General;  Laterality: Left;   BREAST LUMPECTOMY WITH RADIOACTIVE SEED AND SENTINEL LYMPH NODE BIOPSY Left 05/28/2020   Procedure: LEFT BREAST LUMPECTOMY WITH RADIOACTIVE SEED AND SENTINEL LYMPH NODE BIOPSY;  Surgeon: Jovita Kussmaul, MD;  Location: [facility];  Service: General;  Laterality: Left;   LUNG REMOVAL, PARTIAL Right 2005   Upper lobe   TRIGGER FINGER RELEASE     TUBAL LIGATION  1980    FAMILY HISTORY Family History  Problem Relation Age of Onset   Heart disease Father    High blood pressure Father    Congestive Heart Failure Father    Leukemia Mother    Colon polyps Mother    High blood pressure Brother    Lung cancer Brother    Heart disease Brother    Diabetes Brother    Colon polyps Sister    Breast cancer Sister    High blood pressure Sister    Emphysema Sister    Pancreatic cancer Paternal Uncle    the patient's father died at the age of 63 with congestive 40 failure. Patient's mother died with acute leukemia at the age of 106. The patient had one brother, 2 sisters. One sister had breast cancer at age 23. One brother, smoker, was diagnosed with lung cancer at the age of 12. There is a paternal uncle who may have had pancreatic cancer. There is no history of ovarian cancer in the family.   GYNECOLOGIC HISTORY:  No LMP recorded. Patient is postmenopausal.  menarche age 50, first live birth age 43. The patient is GX P2. She stopped having periods in 1998, took hormone replacement approximately 6 months. Remotely she took oral contraceptives about 3 years with no complications   SOCIAL HISTORY:  (updated November 2022)  Jessica Gilbert worked as an Optometrist.  She describes herself is single. At home she lives by herself, with 3 cats. Her son Jessica Gilbert lives in [place] and was recently promoted to Tax adviser in Event organiser. The patient's other son died 21 years ago. Tkai has 2 grandchildren, a Geographical information systems officer who graduated from [employer] and now is working towards a PhD and a grandson who works for [employer] in [place].. She is a Tourist information centre manager.    ADVANCED DIRECTIVES:  In place. Her son Jessica Gilbert is her healthcare power of attorney. He can be reached at [place]: Social History   Tobacco Use   Smoking status: Former    Packs/day: 1.00    Years: 35.00    Pack years: 35.00    Types: Cigarettes    Quit date: 01/29/2004    Years since quitting: 17.2   Smokeless tobacco: Never  Vaping Use   Vaping Use: Never used  Substance  Use Topics   Alcohol use: Yes    Alcohol/week: 0.0 standard drinks    Comment: wine occasionally   Drug use: No     Colonoscopy:  PAP:  Bone density: remote.   Lipid panel:  Allergies  Allergen Reactions   Aspirin Other (See Comments)    "burning stomach"   Codeine Nausea Only   Vitamin D Analogs Other (See Comments)    Current Outpatient Medications  Medication Sig Dispense Refill   albuterol (PROVENTIL HFA;VENTOLIN HFA) 108 (90 BASE) MCG/ACT inhaler Inhale 2 puffs into the lungs every 6 (six) hours as needed for wheezing or shortness of breath. 1 Inhaler 3   diphenhydrAMINE (BENADRYL) 25 MG tablet Take 25 mg by mouth every 8 (eight) hours as needed.     guaiFENesin (MUCINEX) 600 MG 12 hr tablet Take by mouth 2 (two) times daily.     ibuprofen (ADVIL,MOTRIN) 200 MG tablet Take 200 mg by mouth every 6 (six) hours as needed.     methylPREDNISolone (MEDROL DOSEPAK) 4 MG TBPK tablet Take all tablets at breakfast with food. Day one: take 6 tablets Day two: take 5 tablets Day three: take 4 tablets Day four:take 3 tablets Day  five: take two tablets Day six: take one tablet 21 tablet 0   mometasone-formoterol (DULERA) 200-5 MCG/ACT AERO Inhale 2 puffs into the lungs 2 (two) times daily. 1 Inhaler 5   Multiple Vitamin (MULTIVITAMIN WITH MINERALS) TABS tablet Take 1 tablet by mouth daily.     telmisartan (MICARDIS) 40 MG tablet Take 40 mg by mouth daily.     traMADol (ULTRAM) 50 MG tablet Take 1-2 tablets (50-100 mg total) by mouth every 6 (six) hours as needed. 20 tablet 1   No current facility-administered medications for this visit.    OBJECTIVE: White woman in no acute distress  Vitals:   04/25/21 1303  BP: (!) 158/77  Pulse: 85  Resp: 16  Temp: 97.7 F (36.5 C)  SpO2: 96%      Body mass index is 19.04 kg/m.    ECOG FS: Filed Weights   04/25/21 1303  Weight: 110 lb 14.4 oz (50.3 kg)    Sclerae unicteric, EOMs intact Wearing a mask No cervical or supraclavicular adenopathy Lungs no rales or rhonchi Heart regular rate and rhythm Abd soft, nontender, positive bowel sounds MSK no focal spinal tenderness, no upper extremity lymphedema Neuro: nonfocal, well oriented, appropriate affect Breasts: The right breast is unremarkable.  The left breast is status post lumpectomy x2, with no evidence of local recurrence.  Both axillae are benign.   LAB RESULTS:  CMP     Component Value Date/Time   NA 142 07/12/2020 1443   NA 140 10/08/2016 1152   K 4.5 07/12/2020 1443   K 4.3 10/08/2016 1152   CL 104 07/12/2020 1443   CO2 28 07/12/2020 1443   CO2 25 10/08/2016 1152   GLUCOSE 120 (H) 07/12/2020 1443   GLUCOSE 98 10/08/2016 1152   BUN 17 07/12/2020 1443   BUN 12.4 10/08/2016 1152   CREATININE 0.96 07/12/2020 1443   CREATININE 0.8 10/08/2016 1152   CALCIUM 9.5 07/12/2020 1443   CALCIUM 9.7 10/08/2016 1152   PROT 7.7 07/12/2020 1443   PROT 7.1 10/08/2016 1152   ALBUMIN 4.2 07/12/2020 1443   ALBUMIN 4.1 10/08/2016 1152   AST 27 07/12/2020 1443   AST 23 10/08/2016 1152   ALT 20 07/12/2020 1443    ALT 18 10/08/2016 1152   ALKPHOS 77 07/12/2020 1443  ALKPHOS 75 10/08/2016 1152   BILITOT 0.3 07/12/2020 1443   BILITOT 0.42 10/08/2016 1152   GFRNONAA >60 07/12/2020 1443   GFRAA >60 01/09/2020 1503    INo results found for: SPEP, UPEP  Lab Results  Component Value Date   WBC 9.6 04/25/2021   NEUTROABS 6.8 04/25/2021   HGB 14.0 04/25/2021   HCT 43.3 04/25/2021   MCV 88.4 04/25/2021   PLT 308 04/25/2021      Chemistry      Component Value Date/Time   NA 142 07/12/2020 1443   NA 140 10/08/2016 1152   K 4.5 07/12/2020 1443   K 4.3 10/08/2016 1152   CL 104 07/12/2020 1443   CO2 28 07/12/2020 1443   CO2 25 10/08/2016 1152   BUN 17 07/12/2020 1443   BUN 12.4 10/08/2016 1152   CREATININE 0.96 07/12/2020 1443   CREATININE 0.8 10/08/2016 1152      Component Value Date/Time   CALCIUM 9.5 07/12/2020 1443   CALCIUM 9.7 10/08/2016 1152   ALKPHOS 77 07/12/2020 1443   ALKPHOS 75 10/08/2016 1152   AST 27 07/12/2020 1443   AST 23 10/08/2016 1152   ALT 20 07/12/2020 1443   ALT 18 10/08/2016 1152   BILITOT 0.3 07/12/2020 1443   BILITOT 0.42 10/08/2016 1152       No results found for: LABCA2  No components found for: LABCA125  No results for input(s): INR in the last 168 hours.  Urinalysis No results found for: COLORURINE, APPEARANCEUR, LABSPEC, PHURINE, GLUCOSEU, HGBUR, BILIRUBINUR, KETONESUR, PROTEINUR, UROBILINOGEN, NITRITE, LEUKOCYTESUR   STUDIES: No results found.   ASSESSMENT: 75 y.o.  [place] woman status post left breast lower outer quadrant biopsy 06/15/2015 for a clinical mT1c N0, stage IA  Invasive ductal carcinoma, grade 1, estrogen and progesterone receptor positive, HER-2 not amplified, with an MIB-1 of 11%  (1) status post left lumpectomy with sentinel lymph node sampling 07/17/2015 for a pT1b pN0, stage IA invasive ductal carcinoma, grade 2, with negative though close margins; repeat HER-2 was again negative, and estrogen and progesterone  receptors were again positive  (2) Oncotype DX score of 16 predicts an outside the breast risk of recurrence within 10 years of 10% if the patient's only systemic therapy is tamoxifen for 5 years. It also her next no benefit from chemotherapy  (3)  The patient opted against adjuvant radiation  (4)  Anastrozole started 09/07/2015 discontinued May 2017 with intolerance  (a) exemestane started June 2017, discontinued August 2017 with intolerance.  (b) bone density at [facility] 11/05/2015 shows a T score of -2.0   (5) Factor V Leiden positive: elevated clotting risk with any procedure, immobilization, or procoagulant drugs such as estrogen  (6) s/p right upper lobectomy (after neoadjuvant chemo-radiation) August  2005 for a primary lung cancer: Non-small cell carcinoma, with a 4 cm area of tumor regression and necrosis and rare atypical cells but no obvious viable tumor remaining, and a total of 9 regional lymph nodes (N1, and 2) all negative.  (7) status post left breast upper outer quadrant 05/01/2020 shows invasive ductal carcinoma, low-grade, estrogen and progesterone receptor positive, HER-2 not amplified, with an MIB-1 of 25%  (8) status post left lumpectomy and sentinel lymph node sampling 05/28/2020 for a pT1b pN0, stage IA invasive ductal carcinoma, grade 2, with negative margins.  (a) a single left axillary lymph node removed  (b) status post chemoradiation for right upper lobectomy, making adjuvant radiation for her breast cancer problematic  (9) fulvestrant  to start 07/13/2019, but patient opted against  (a) patient is intolerant of aromatase inhibitors and tamoxifen is contraindicated (#5)   PLAN:  Jessica Gilbert is now 17 years out from definitive surgery for her stage I lung cancer and coming up on a year out from her second left lumpectomy, with no evidence of active disease.  This is favorable.  Aside from the weight loss she has a benign review of systems.  I am going to  obtain a CT of the chest just to make sure there is nothing obvious.  She is of course also due for mammography later this month.  I am putting in a virtual visit with her the first week in December just to tie all this up but I am expecting good news from the above studies.  We did discuss diet issues and I recommended she go with carbohydrates except her problem as she has celiac disease so they have to be somewhat selective carbohydrates.  She will see [place] again in April.  She knows to call for any other issue that may develop before then  Total encounter time 25 minutes.*  Total encounter time 25 minutes.*   Shayla Heming, Virgie Dad, MD  04/25/21 1:24 PM Medical Oncology and Hematology [facility] [place], [place] [address] Tel. [phone]    Fax. [phone]   I, Wilburn Mylar, am acting as scribe for Dr. Virgie Dad. Makinlee Awwad.  I, Lurline Del MD, have reviewed the above documentation for accuracy and completeness, and I agree with the above.   *Total Encounter Time as defined by the Centers for Medicare and Medicaid Services includes, in addition to the face-to-face time of a patient visit (documented in the note above) non-face-to-face time: obtaining and reviewing outside history, ordering and reviewing medications, tests or procedures, care coordination (communications with other health care professionals or caregivers) and documentation in the medical record.

## 2021-04-25 ENCOUNTER — Inpatient Hospital Stay: Payer: Medicare HMO | Attending: Oncology | Admitting: Oncology

## 2021-04-25 ENCOUNTER — Other Ambulatory Visit: Payer: Self-pay

## 2021-04-25 ENCOUNTER — Inpatient Hospital Stay: Payer: Medicare HMO

## 2021-04-25 VITALS — BP 158/77 | HR 85 | Temp 97.7°F | Resp 16 | Ht 64.0 in | Wt 110.9 lb

## 2021-04-25 DIAGNOSIS — Z803 Family history of malignant neoplasm of breast: Secondary | ICD-10-CM | POA: Insufficient documentation

## 2021-04-25 DIAGNOSIS — Z836 Family history of other diseases of the respiratory system: Secondary | ICD-10-CM | POA: Diagnosis not present

## 2021-04-25 DIAGNOSIS — Z8249 Family history of ischemic heart disease and other diseases of the circulatory system: Secondary | ICD-10-CM | POA: Insufficient documentation

## 2021-04-25 DIAGNOSIS — D6851 Activated protein C resistance: Secondary | ICD-10-CM | POA: Diagnosis not present

## 2021-04-25 DIAGNOSIS — C50512 Malignant neoplasm of lower-outer quadrant of left female breast: Secondary | ICD-10-CM

## 2021-04-25 DIAGNOSIS — Z801 Family history of malignant neoplasm of trachea, bronchus and lung: Secondary | ICD-10-CM | POA: Insufficient documentation

## 2021-04-25 DIAGNOSIS — R1011 Right upper quadrant pain: Secondary | ICD-10-CM | POA: Insufficient documentation

## 2021-04-25 DIAGNOSIS — Z170 Estrogen receptor positive status [ER+]: Secondary | ICD-10-CM | POA: Diagnosis not present

## 2021-04-25 DIAGNOSIS — Z833 Family history of diabetes mellitus: Secondary | ICD-10-CM | POA: Diagnosis not present

## 2021-04-25 DIAGNOSIS — Z902 Acquired absence of lung [part of]: Secondary | ICD-10-CM | POA: Diagnosis not present

## 2021-04-25 DIAGNOSIS — Z87891 Personal history of nicotine dependence: Secondary | ICD-10-CM | POA: Insufficient documentation

## 2021-04-25 DIAGNOSIS — Z79899 Other long term (current) drug therapy: Secondary | ICD-10-CM | POA: Insufficient documentation

## 2021-04-25 DIAGNOSIS — C50412 Malignant neoplasm of upper-outer quadrant of left female breast: Secondary | ICD-10-CM | POA: Insufficient documentation

## 2021-04-25 DIAGNOSIS — Z923 Personal history of irradiation: Secondary | ICD-10-CM | POA: Diagnosis not present

## 2021-04-25 DIAGNOSIS — Z806 Family history of leukemia: Secondary | ICD-10-CM | POA: Insufficient documentation

## 2021-04-25 DIAGNOSIS — Z800 Family history of malignant neoplasm of digestive organs: Secondary | ICD-10-CM | POA: Diagnosis not present

## 2021-04-25 DIAGNOSIS — I10 Essential (primary) hypertension: Secondary | ICD-10-CM | POA: Insufficient documentation

## 2021-04-25 DIAGNOSIS — Z886 Allergy status to analgesic agent status: Secondary | ICD-10-CM | POA: Insufficient documentation

## 2021-04-25 DIAGNOSIS — Z85118 Personal history of other malignant neoplasm of bronchus and lung: Secondary | ICD-10-CM | POA: Diagnosis not present

## 2021-04-25 DIAGNOSIS — Z8371 Family history of colonic polyps: Secondary | ICD-10-CM | POA: Insufficient documentation

## 2021-04-25 DIAGNOSIS — C50912 Malignant neoplasm of unspecified site of left female breast: Secondary | ICD-10-CM

## 2021-04-25 DIAGNOSIS — Z9221 Personal history of antineoplastic chemotherapy: Secondary | ICD-10-CM | POA: Insufficient documentation

## 2021-04-25 DIAGNOSIS — Z885 Allergy status to narcotic agent status: Secondary | ICD-10-CM | POA: Insufficient documentation

## 2021-04-25 DIAGNOSIS — Z8673 Personal history of transient ischemic attack (TIA), and cerebral infarction without residual deficits: Secondary | ICD-10-CM | POA: Diagnosis not present

## 2021-04-25 LAB — CMP (CANCER CENTER ONLY)
ALT: 24 U/L (ref 0–44)
AST: 27 U/L (ref 15–41)
Albumin: 4.3 g/dL (ref 3.5–5.0)
Alkaline Phosphatase: 70 U/L (ref 38–126)
Anion gap: 10 (ref 5–15)
BUN: 11 mg/dL (ref 8–23)
CO2: 26 mmol/L (ref 22–32)
Calcium: 10 mg/dL (ref 8.9–10.3)
Chloride: 104 mmol/L (ref 98–111)
Creatinine: 0.88 mg/dL (ref 0.44–1.00)
GFR, Estimated: >60 mL/min (ref >60)
Glucose, Bld: 118 mg/dL — ABNORMAL HIGH (ref 70–99)
Potassium: 4.3 mmol/L (ref 3.5–5.1)
Sodium: 140 mmol/L (ref 135–145)
Total Bilirubin: 0.5 mg/dL (ref 0.3–1.2)
Total Protein: 7.5 g/dL (ref 6.5–8.1)

## 2021-04-25 LAB — CBC WITH DIFFERENTIAL (CANCER CENTER ONLY)
Abs Immature Granulocytes: 0.02 10*3/uL (ref 0.00–0.07)
Basophils Absolute: 0 10*3/uL (ref 0.0–0.1)
Basophils Relative: 0 %
Eosinophils Absolute: 0 10*3/uL (ref 0.0–0.5)
Eosinophils Relative: 0 %
HCT: 43.3 % (ref 36.0–46.0)
Hemoglobin: 14 g/dL (ref 12.0–15.0)
Immature Granulocytes: 0 %
Lymphocytes Relative: 23 %
Lymphs Abs: 2.2 10*3/uL (ref 0.7–4.0)
MCH: 28.6 pg (ref 26.0–34.0)
MCHC: 32.3 g/dL (ref 30.0–36.0)
MCV: 88.4 fL (ref 80.0–100.0)
Monocytes Absolute: 0.6 10*3/uL (ref 0.1–1.0)
Monocytes Relative: 6 %
Neutro Abs: 6.8 10*3/uL (ref 1.7–7.7)
Neutrophils Relative %: 71 %
Platelet Count: 308 10*3/uL (ref 150–400)
RBC: 4.9 MIL/uL (ref 3.87–5.11)
RDW: 13 % (ref 11.5–15.5)
WBC Count: 9.6 10*3/uL (ref 4.0–10.5)
nRBC: 0 % (ref 0.0–0.2)

## 2021-05-07 ENCOUNTER — Ambulatory Visit (HOSPITAL_COMMUNITY)
Admission: RE | Admit: 2021-05-07 | Discharge: 2021-05-07 | Disposition: A | Discharge status: Home / Self Care | Payer: Medicare HMO | Source: Ambulatory Visit | Attending: Oncology | Admitting: Oncology

## 2021-05-07 ENCOUNTER — Other Ambulatory Visit: Payer: Self-pay

## 2021-05-07 DIAGNOSIS — R911 Solitary pulmonary nodule: Secondary | ICD-10-CM | POA: Diagnosis not present

## 2021-05-07 DIAGNOSIS — Z170 Estrogen receptor positive status [ER+]: Secondary | ICD-10-CM | POA: Insufficient documentation

## 2021-05-07 DIAGNOSIS — C50912 Malignant neoplasm of unspecified site of left female breast: Secondary | ICD-10-CM | POA: Diagnosis not present

## 2021-05-07 DIAGNOSIS — J439 Emphysema, unspecified: Secondary | ICD-10-CM | POA: Diagnosis not present

## 2021-05-07 DIAGNOSIS — C50512 Malignant neoplasm of lower-outer quadrant of left female breast: Secondary | ICD-10-CM | POA: Diagnosis not present

## 2021-05-07 DIAGNOSIS — I700 Atherosclerosis of aorta: Secondary | ICD-10-CM | POA: Diagnosis not present

## 2021-05-07 DIAGNOSIS — D6851 Activated protein C resistance: Secondary | ICD-10-CM

## 2021-05-07 MED ORDER — SODIUM CHLORIDE (PF) 0.9 % IJ SOLN
INTRAMUSCULAR | Status: AC
  Filled 2021-05-07: qty 50

## 2021-05-07 MED ORDER — IOHEXOL 350 MG/ML SOLN
60.0000 mL | Freq: Once | INTRAVENOUS | Status: AC | PRN
  Administered 2021-05-07: 60 mL via INTRAVENOUS

## 2021-05-08 ENCOUNTER — Telehealth: Payer: Self-pay | Admitting: *Deleted

## 2021-05-08 ENCOUNTER — Encounter: Payer: Self-pay | Admitting: Oncology

## 2021-05-08 DIAGNOSIS — J189 Pneumonia, unspecified organism: Secondary | ICD-10-CM

## 2021-05-08 DIAGNOSIS — C50512 Malignant neoplasm of lower-outer quadrant of left female breast: Secondary | ICD-10-CM

## 2021-05-08 DIAGNOSIS — R922 Inconclusive mammogram: Secondary | ICD-10-CM | POA: Diagnosis not present

## 2021-05-08 DIAGNOSIS — C3411 Malignant neoplasm of upper lobe, right bronchus or lung: Secondary | ICD-10-CM

## 2021-05-08 DIAGNOSIS — Z853 Personal history of malignant neoplasm of breast: Secondary | ICD-10-CM | POA: Diagnosis not present

## 2021-05-08 DIAGNOSIS — Z170 Estrogen receptor positive status [ER+]: Secondary | ICD-10-CM

## 2021-05-08 DIAGNOSIS — N6489 Other specified disorders of breast: Secondary | ICD-10-CM | POA: Diagnosis not present

## 2021-05-08 MED ORDER — AZITHROMYCIN 250 MG PO TABS: ORAL_TABLET | ORAL | 0 refills | Status: DC

## 2021-05-08 NOTE — Telephone Encounter (Signed)
This RN spoke with pt per MD review of CT with noted concern for infection.  Pt states she has " had breathing issues for years and it may be a little worse at present "  She is not running fevers.  Per discussion pharmacy verified and prescription sent.  This RN also discussed MD recommendation to be seen by a pulmonologist- Dr Montez Morita with pt verbalizing agreement.  Referral placed per above.

## 2021-05-21 NOTE — Progress Notes (Signed)
[place]  [phone] Fax:([phone]) [phone]     ID: Jessica Gilbert DOB: October 07, 1945  MR#: [id number]  JJH#:[id number]  Patient Care Team: Vernie Shanks, MD as PCP - General (Family Medicine) Eddy Liszewski, Virgie Dad, MD as Consulting Physician (Oncology) [name], MD as Attending Physician (Radiation Oncology) Jovita Kussmaul, MD as Consulting Physician (General Surgery) Mauro Kaufmann, RN as Oncology Nurse Navigator Rockwell Germany, RN as Oncology Nurse Navigator OTHER MD:  I connected with Enis Slipper on 05/21/21 at  4:30 PM EST by telephone visit and verified that I am speaking with the correct person using two identifiers.   I discussed the limitations, risks, security and privacy concerns of performing an evaluation and management service by telemedicine and the availability of in-person appointments. I also discussed with the patient that there may be a patient responsible charge related to this service. The patient expressed understanding and agreed to proceed.   Other persons participating in the visit and their role in the encounter: None  Patient's location: Home Provider's location: [facility]   I provided 20 minutes of non face-to-face telephone visit time during this encounter, and > 50% was spent counseling as documented under my assessment & plan.   CHIEF COMPLAINT:  Estrogen receptor positive breast cancer  CURRENT TREATMENT: Observation   INTERVAL HISTORY: Jessica Gilbert was contacted today for follow-up of her estrogen receptor positive breast cancer. She continues under observation.    Since her last visit, she underwent restaging chest CT on 05/07/2021 showing: multiple new nodular areas of architectural distortion in left upper lobe near apex, nonspecific and favored infectious or inflammatory; imaging findings suggestive of underlying COPD. Short-term follow up (3-4 weeks) with repeat chest CT without contrast was  recommended.  She also underwent bilateral diagnostic mammography with tomography at [facility] on 05/08/2021 showing: breast density category C; there were new postsurgical changes in the upper outer left breast consistent with a lumpectomy but also developing density in the left retroareolar breast.  This also might be related to interval surgery but ultrasound of the left breast was performed showing a scar at 3:00 extending from the areola posteriorly.  This was all consistent with postsurgical change.   REVIEW OF SYSTEMS: Jessica Gilbert tells me she has had 2 rounds of antibiotics for "severe sinus and mucus problems.  There was some improvement but no resolution.  She generally feels well otherwise, and is walking for exercise.  A detailed review of systems was otherwise stable.   COVID 19 VACCINATION STATUS: Status post Moderna x4,   BREAST CANCER HISTORY: From the original intake note:   Jessica Gilbert (pronounced "ro-lund") had bilateral screening mammography at the breast Center 05/26/2014 showing a possible mass in the left breast. Left digital mammography with ultrasonography 06/14/2014 found a breast density to be category C. There was no persistent mass or distortion or malignant-type microcalcifications. . There was no palpable mass. Ultrasound showed a well circumscribed benign appearing nodule in the left breast 2:00 position measuring 4 mm. This was felt to be most likely benign but repeat ultrasound  At 6 months was recommended and thiswas performed 12/14/2014.  The mass was unchanged an incidental note was made of a benign appearing 0.5 cm lymph node in the left breast 2:00 position.   On 06/15/2015 Jessica Gilbert had bilateral diagnostic mammography with tomosynthesis and this time an irregular mass with spiculated margins was noted in the outer lower left breast measuring approximately 9 mm. Physical examination  confirmed a firm fixed nodule at the 3:30 o'clock position an ultrasound of this area  confirmed an irregular hypoechoic mass measuring 0.7 cm. There was a 0.2 cm adjacent satellite nodule. There was no left axillary lymphadenopathy.   biopsy of this mass was obtained 06/15/2015, and showed ([id number]) an invasive ductal carcinoma, grade 1, estrogen receptor 100% positive, progesterone receptor 40% positive, both with strong staining intensity, with an MIB-1 of 11%, and no HER-2 amplification, the signals ratio being 1.39 and the number per cell 2.15.   Jessica Gilbert also has a history of prior tobacco abuse, status post right upper lobectomy for lung cancer, with significant emphysema.   The patient's subsequent history is as detailed below   PAST MEDICAL HISTORY: Past Medical History:  Diagnosis Date   Acute respiratory failure ([place])    Breast cancer ([place])    Carotid stenosis, right    Celiac disease    Emphysema lung ([place])    History of hiatal hernia    Human metapneumovirus pneumonia March 2015   Hyperlipidemia    Hypertension    Lung cancer ([place]) 2005   Rt upper lobe   Stroke ([place])    TIA w/o defecits    PAST SURGICAL HISTORY: Past Surgical History:  Procedure Laterality Date   BREAST LUMPECTOMY     left    BREAST LUMPECTOMY WITH RADIOACTIVE SEED AND SENTINEL LYMPH NODE BIOPSY Left 07/17/2015   Procedure: LEFT BREAST LUMPECTOMY WITH RADIOACTIVE SEED WITH LEFT AXILLARY SENTINEL LYMPH NODE BIOPSY;  Surgeon: [name], MD;  Location: [place];  Service: General;  Laterality: Left;   BREAST LUMPECTOMY WITH RADIOACTIVE SEED AND SENTINEL LYMPH NODE BIOPSY Left 05/28/2020   Procedure: LEFT BREAST LUMPECTOMY WITH RADIOACTIVE SEED AND SENTINEL LYMPH NODE BIOPSY;  Surgeon: Jovita Kussmaul, MD;  Location: [place];  Service: General;  Laterality: Left;   LUNG REMOVAL, PARTIAL Right 2005   Upper lobe   TRIGGER FINGER RELEASE     TUBAL LIGATION  1980    FAMILY HISTORY Family History  Problem Relation Age of Onset   Heart disease Father     High blood pressure Father    Congestive Heart Failure Father    Leukemia Mother    Colon polyps Mother    High blood pressure Brother    Lung cancer Brother    Heart disease Brother    Diabetes Brother    Colon polyps Sister    Breast cancer Sister    High blood pressure Sister    Emphysema Sister    Pancreatic cancer Paternal Uncle    the patient's father died at the age of 35 with congestive 40 failure. Patient's mother died with acute leukemia at the age of 14. The patient had one brother, 2 sisters. One sister had breast cancer at age 91. One brother, smoker, was diagnosed with lung cancer at the age of 81. There is a paternal uncle who may have had pancreatic cancer. There is no history of ovarian cancer in the family.   GYNECOLOGIC HISTORY:  No LMP recorded. Patient is postmenopausal.  menarche age 67, first live birth age 89. The patient is GX P2. She stopped having periods in 1998, took hormone replacement approximately 6 months. Remotely she took oral contraceptives about 3 years with no complications   SOCIAL HISTORY: (updated November 2022)  Jessica Gilbert worked as an Optometrist.  She describes herself is single. At home she lives by herself, with 3 cats. Her son Jessica Belts  Gilbert lives in [place] and was recently promoted to Tax adviser in Event organiser. The patient's other son died 59 years ago. Jessica Gilbert has 2 grandchildren, a Geographical information systems officer who graduated from [employer] and now is working towards a PhD and a grandson who works for [employer] in [place].. She is a Tourist information centre manager.    ADVANCED DIRECTIVES:  In place. Her son Jessica Belts is her healthcare power of attorney. He can be reached at [place]: Social History   Tobacco Use   Smoking status: Former    Packs/day: 1.00    Years: 35.00    Pack years: 35.00    Types: Cigarettes    Quit date: 01/29/2004    Years since quitting: 17.3   Smokeless tobacco: Never  Vaping Use   Vaping Use: Never used  Substance  Use Topics   Alcohol use: Yes    Alcohol/week: 0.0 standard drinks    Comment: wine occasionally   Drug use: No     Colonoscopy:  PAP:  Bone density: remote.   Lipid panel:  Allergies  Allergen Reactions   Aspirin Other (See Comments)    "burning stomach"   Codeine Nausea Only   Vitamin D Analogs Other (See Comments)    Current Outpatient Medications  Medication Sig Dispense Refill   albuterol (PROVENTIL HFA;VENTOLIN HFA) 108 (90 BASE) MCG/ACT inhaler Inhale 2 puffs into the lungs every 6 (six) hours as needed for wheezing or shortness of breath. 1 Inhaler 3   azithromycin (ZITHROMAX) 250 MG tablet Take as directed 6 each 0   diphenhydrAMINE (BENADRYL) 25 MG tablet Take 25 mg by mouth every 8 (eight) hours as needed.     guaiFENesin (MUCINEX) 600 MG 12 hr tablet Take by mouth 2 (two) times daily.     ibuprofen (ADVIL,MOTRIN) 200 MG tablet Take 200 mg by mouth every 6 (six) hours as needed.     methylPREDNISolone (MEDROL DOSEPAK) 4 MG TBPK tablet Take all tablets at breakfast with food. Day one: take 6 tablets Day two: take 5 tablets Day three: take 4 tablets Day four:take 3 tablets Day five: take two tablets Day six: take one tablet 21 tablet 0   mometasone-formoterol (DULERA) 200-5 MCG/ACT AERO Inhale 2 puffs into the lungs 2 (two) times daily. 1 Inhaler 5   Multiple Vitamin (MULTIVITAMIN WITH MINERALS) TABS tablet Take 1 tablet by mouth daily.     telmisartan (MICARDIS) 40 MG tablet Take 40 mg by mouth daily.     traMADol (ULTRAM) 50 MG tablet Take 1-2 tablets (50-100 mg total) by mouth every 6 (six) hours as needed. 20 tablet 1   No current facility-administered medications for this visit.    OBJECTIVE:   There were no vitals filed for this visit.     There is no height or weight on file to calculate BMI.    ECOG FS: There were no vitals filed for this visit.   Telemedicine visit 05/22/2021     LAB RESULTS:  CMP     Component Value Date/Time   NA 140  04/25/2021 1248   NA 140 10/08/2016 1152   K 4.3 04/25/2021 1248   K 4.3 10/08/2016 1152   CL 104 04/25/2021 1248   CO2 26 04/25/2021 1248   CO2 25 10/08/2016 1152   GLUCOSE 118 (H) 04/25/2021 1248   GLUCOSE 98 10/08/2016 1152   BUN 11 04/25/2021 1248   BUN 12.4 10/08/2016 1152   CREATININE 0.88 04/25/2021 1248   CREATININE 0.8 10/08/2016 1152  CALCIUM 10.0 04/25/2021 1248   CALCIUM 9.7 10/08/2016 1152   PROT 7.5 04/25/2021 1248   PROT 7.1 10/08/2016 1152   ALBUMIN 4.3 04/25/2021 1248   ALBUMIN 4.1 10/08/2016 1152   AST 27 04/25/2021 1248   AST 23 10/08/2016 1152   ALT 24 04/25/2021 1248   ALT 18 10/08/2016 1152   ALKPHOS 70 04/25/2021 1248   ALKPHOS 75 10/08/2016 1152   BILITOT 0.5 04/25/2021 1248   BILITOT 0.42 10/08/2016 1152   GFRNONAA >60 04/25/2021 1248   GFRAA >60 01/09/2020 1503    INo results found for: SPEP, UPEP  Lab Results  Component Value Date   WBC 9.6 04/25/2021   NEUTROABS 6.8 04/25/2021   HGB 14.0 04/25/2021   HCT 43.3 04/25/2021   MCV 88.4 04/25/2021   PLT 308 04/25/2021      Chemistry      Component Value Date/Time   NA 140 04/25/2021 1248   NA 140 10/08/2016 1152   K 4.3 04/25/2021 1248   K 4.3 10/08/2016 1152   CL 104 04/25/2021 1248   CO2 26 04/25/2021 1248   CO2 25 10/08/2016 1152   BUN 11 04/25/2021 1248   BUN 12.4 10/08/2016 1152   CREATININE 0.88 04/25/2021 1248   CREATININE 0.8 10/08/2016 1152      Component Value Date/Time   CALCIUM 10.0 04/25/2021 1248   CALCIUM 9.7 10/08/2016 1152   ALKPHOS 70 04/25/2021 1248   ALKPHOS 75 10/08/2016 1152   AST 27 04/25/2021 1248   AST 23 10/08/2016 1152   ALT 24 04/25/2021 1248   ALT 18 10/08/2016 1152   BILITOT 0.5 04/25/2021 1248   BILITOT 0.42 10/08/2016 1152       No results found for: LABCA2  No components found for: LABCA125  No results for input(s): INR in the last 168 hours.  Urinalysis No results found for: COLORURINE, APPEARANCEUR, LABSPEC, PHURINE, GLUCOSEU,  HGBUR, BILIRUBINUR, KETONESUR, PROTEINUR, UROBILINOGEN, NITRITE, LEUKOCYTESUR   STUDIES: CT Chest W Contrast  Result Date: 05/08/2021 CLINICAL DATA:  75 year old female with history of breast cancer with history of weight loss. Follow-up study. EXAM: CT CHEST WITH CONTRAST TECHNIQUE: Multidetector CT imaging of the chest was performed during intravenous contrast administration. CONTRAST:  41m OMNIPAQUE IOHEXOL 350 MG/ML SOLN COMPARISON:  Chest CT 03/14/2014. FINDINGS: Cardiovascular: Heart size is normal. Small amount of anterior pericardial fluid and/or thickening, unlikely to be of any hemodynamic significance at this time. No associated pericardial calcification. There is aortic atherosclerosis, as well as atherosclerosis of the great vessels of the mediastinum and the coronary arteries, including calcified atherosclerotic plaque in the left main, left anterior descending, left circumflex and right coronary arteries. Mediastinum/Nodes: No pathologically enlarged mediastinal, internal mammary or hilar lymph nodes. Esophagus is unremarkable in appearance. No axillary or subpectoral lymphadenopathy. Lungs/Pleura: Status post right upper lobectomy. Compensatory hyperexpansion of the right middle and lower lobes. In the left upper lobe there are numerous nodular areas of architectural distortion, particularly near the apex. The largest of these has some internal cavitation (axial image 21 of series 7) measuring up to 1.5 x 1.2 x 1.7 cm, and is in contact with the pleural surface superiorly. Several nearby satellite nodules are noted in the left upper lobe, largest of which measures only 7 x 6 mm (axial image 36 of series 7). No acute consolidative airspace disease. No pleural effusions. Diffuse bronchial wall thickening with severe centrilobular and paraseptal emphysema. Upper Abdomen: Aortic atherosclerosis. Musculoskeletal: There are no aggressive appearing lytic or blastic  lesions noted in the visualized  portions of the skeleton. IMPRESSION: 1. Multiple new nodular areas of architectural distortion in the left upper lobe near the apex, largest of which has some internal cavitation. These are nonspecific, and favored to be of infectious or inflammatory etiology, however, underlying neoplasm is difficult to entirely exclude. Further clinical evaluation is recommended. Consideration for short-term trial of antimicrobial therapy followed by repeat noncontrast chest CT in 3-4 weeks is suggested for initial evaluation. PET-CT is not encouraged, as the likelihood of a false-positive examination in the setting of infection is very high. 2. Diffuse bronchial wall thickening with severe centrilobular and paraseptal emphysema; imaging findings suggestive of underlying COPD. 3. Aortic atherosclerosis, in addition to left main and 3 vessel coronary artery disease. Assessment for potential risk factor modification, dietary therapy or pharmacologic therapy may be warranted, if clinically indicated. These results will be called to the ordering clinician or representative by the Radiologist Assistant, and communication documented in the PACS or [employer]. Aortic Atherosclerosis (ICD10-I70.0) and Emphysema (ICD10-J43.9). Electronically Signed   By: Vinnie Langton M.D.   On: 05/08/2021 05:21     ASSESSMENT: 75 y.o.  [place] woman status post left breast lower outer quadrant biopsy 06/15/2015 for a clinical mT1c N0, stage IA  Invasive ductal carcinoma, grade 1, estrogen and progesterone receptor positive, HER-2 not amplified, with an MIB-1 of 11%  (1) status post left lumpectomy with sentinel lymph node sampling 07/17/2015 for a pT1b pN0, stage IA invasive ductal carcinoma, grade 2, with negative though close margins; repeat HER-2 was again negative, and estrogen and progesterone receptors were again positive  (2) Oncotype DX score of 16 predicts an outside the breast risk of recurrence within 10 years of 10% if the  patient's only systemic therapy is tamoxifen for 5 years. It also her next no benefit from chemotherapy  (3)  The patient opted against adjuvant radiation  (4)  Anastrozole started 09/07/2015 discontinued May 2017 with intolerance  (a) exemestane started June 2017, discontinued August 2017 with intolerance.  (b) [facility]05/2015 shows a T score of -2.0   (5) Factor V Leiden positive: elevated clotting risk with any procedure, immobilization, or procoagulant drugs such as estrogen  (6) s/p right upper lobectomy (after neoadjuvant chemo-radiation) August  2005 for a primary lung cancer: Non-small cell carcinoma, with a 4 cm area of tumor regression and necrosis and rare atypical cells but no obvious viable tumor remaining, and a total of 9 regional lymph nodes (N1, and 2) all negative.  (7) status post left breast upper outer quadrant 05/01/2020 shows invasive ductal carcinoma, low-grade, estrogen and progesterone receptor positive, HER-2 not amplified, with an MIB-1 of 25%  (8) status post left lumpectomy and sentinel lymph node sampling 05/28/2020 for a pT1b pN0, stage IA invasive ductal carcinoma, grade 2, with negative margins.  (a) a single left axillary lymph node removed  (b) status post chemoradiation for right upper lobectomy, making adjuvant radiation for her breast cancer problematic  (9) fulvestrant was to start 07/13/2019, but patient opted against  (a) patient is intolerant of aromatase inhibitors and tamoxifen is contraindicated (#5)   PLAN:  Corianne is now 41 years out from definitive surgery for her stage I lung cancer and a little over a year out from her second left lumpectomy, with no evidence of active disease.  She has refused adjuvant antiestrogens and is being followed with observation alone.    She has had significant "sinus" problems and  chest CT 05/08/2021 shows what most likely is residual changes from infection or inflammation.  She has  had 2 courses of antibiotic with some clinical improvement.  We considered referral to a pulmonologist but I think what we will do is schedule her to see [place] sometime in January for follow-up.  She will have a chest x-ray on the way in so we can assess for resolution of the changes noted on the CT of the chest.  She knows to call us for any other issues that may develop before the next visit.   Cai Flott, Virgie Dad, MD  05/21/21 7:47 PM Medical Oncology and Hematology [facility] [place], [place] [address] Tel. [phone]    Fax. [phone]   I, Wilburn Mylar, am acting as scribe for Dr. Virgie Dad. Jessica Gilbert.  I, Lurline Del MD, have reviewed the above documentation for accuracy and completeness, and I agree with the above.   *Total Encounter Time as defined by the Centers for Medicare and Medicaid Services includes, in addition to the face-to-face time of a patient visit (documented in the note above) non-face-to-face time: obtaining and reviewing outside history, ordering and reviewing medications, tests or procedures, care coordination (communications with other health care professionals or caregivers) and documentation in the medical record.

## 2021-05-22 ENCOUNTER — Inpatient Hospital Stay: Payer: Medicare HMO | Attending: Oncology | Admitting: Oncology

## 2021-05-22 DIAGNOSIS — Z8673 Personal history of transient ischemic attack (TIA), and cerebral infarction without residual deficits: Secondary | ICD-10-CM | POA: Diagnosis not present

## 2021-05-22 DIAGNOSIS — Z170 Estrogen receptor positive status [ER+]: Secondary | ICD-10-CM | POA: Insufficient documentation

## 2021-05-22 DIAGNOSIS — E785 Hyperlipidemia, unspecified: Secondary | ICD-10-CM | POA: Diagnosis not present

## 2021-05-22 DIAGNOSIS — C50912 Malignant neoplasm of unspecified site of left female breast: Secondary | ICD-10-CM | POA: Diagnosis not present

## 2021-05-22 DIAGNOSIS — Z791 Long term (current) use of non-steroidal anti-inflammatories (NSAID): Secondary | ICD-10-CM | POA: Diagnosis not present

## 2021-05-22 DIAGNOSIS — J449 Chronic obstructive pulmonary disease, unspecified: Secondary | ICD-10-CM | POA: Diagnosis not present

## 2021-05-22 DIAGNOSIS — C50412 Malignant neoplasm of upper-outer quadrant of left female breast: Secondary | ICD-10-CM | POA: Insufficient documentation

## 2021-05-22 DIAGNOSIS — I10 Essential (primary) hypertension: Secondary | ICD-10-CM | POA: Insufficient documentation

## 2021-05-22 DIAGNOSIS — I700 Atherosclerosis of aorta: Secondary | ICD-10-CM | POA: Diagnosis not present

## 2021-05-22 DIAGNOSIS — C3411 Malignant neoplasm of upper lobe, right bronchus or lung: Secondary | ICD-10-CM

## 2021-05-22 DIAGNOSIS — Z79899 Other long term (current) drug therapy: Secondary | ICD-10-CM | POA: Diagnosis not present

## 2021-05-22 DIAGNOSIS — C50512 Malignant neoplasm of lower-outer quadrant of left female breast: Secondary | ICD-10-CM

## 2021-05-23 ENCOUNTER — Encounter: Payer: Self-pay | Admitting: Oncology

## 2021-05-24 ENCOUNTER — Telehealth: Payer: Self-pay | Admitting: Oncology

## 2021-05-24 NOTE — Telephone Encounter (Signed)
Scheduled appointment per 12/7 los. Patient is aware.

## 2021-07-11 ENCOUNTER — Ambulatory Visit: Payer: Medicare HMO | Admitting: Internal Medicine

## 2021-07-11 ENCOUNTER — Other Ambulatory Visit: Payer: Self-pay

## 2021-07-11 ENCOUNTER — Encounter: Payer: Self-pay | Admitting: Internal Medicine

## 2021-07-11 VITALS — BP 124/70 | HR 74 | Temp 98.4°F | Ht 64.0 in | Wt 111.8 lb

## 2021-07-11 DIAGNOSIS — R0989 Other specified symptoms and signs involving the circulatory and respiratory systems: Secondary | ICD-10-CM | POA: Diagnosis not present

## 2021-07-11 DIAGNOSIS — R0609 Other forms of dyspnea: Secondary | ICD-10-CM | POA: Diagnosis not present

## 2021-07-11 DIAGNOSIS — C3490 Malignant neoplasm of unspecified part of unspecified bronchus or lung: Secondary | ICD-10-CM | POA: Diagnosis not present

## 2021-07-11 DIAGNOSIS — R911 Solitary pulmonary nodule: Secondary | ICD-10-CM | POA: Diagnosis not present

## 2021-07-11 DIAGNOSIS — J449 Chronic obstructive pulmonary disease, unspecified: Secondary | ICD-10-CM | POA: Diagnosis not present

## 2021-07-11 DIAGNOSIS — Z902 Acquired absence of lung [part of]: Secondary | ICD-10-CM

## 2021-07-11 DIAGNOSIS — Z8589 Personal history of malignant neoplasm of other organs and systems: Secondary | ICD-10-CM

## 2021-07-11 DIAGNOSIS — R918 Other nonspecific abnormal finding of lung field: Secondary | ICD-10-CM

## 2021-07-11 LAB — SEDIMENTATION RATE: Sed Rate: 23 mm/hr (ref 0–30)

## 2021-07-11 NOTE — Patient Instructions (Addendum)
Nodule of apex of left lung Multiple lung nodules on CT Hx of Lung cancer - right upper lobectomy 2005  - get blood test for Quantiferon gold, RF, CCP, ANA, DS-DNA, MPO, PR-3, ESR - get PET scan  Chronic obstructive pulmonary disease, unspecified COPD type ([place]) DOE (dyspnea on exertion)  - time to restage lungs copd but noted dyspnea is stable over year but significant  Plan  - full PFT - blood alpha 1 AT phenotype - get echo - do not qualify for veracyte study  Chronic sinus complaints x worse x 1 year  Plan -  start take generic fluticasone inhaler 2 squirts each nostril daily - if not relief we  will get CT scan  Followup  - Eric Form APP to discuss results and decide next step

## 2021-07-11 NOTE — Progress Notes (Signed)
OV 07/11/2021  Subjective:  Patient ID: Jessica Gilbert, female , DOB: 11-24-1945 , age 76 y.o. , MRN: [id number] , ADDRESS: [address] [place] [address] PCP Vernie Shanks, MD Patient Care Team: Vernie Shanks, MD as PCP - General (Family Medicine) Magrinat, Virgie Dad, MD as Consulting Physician (Oncology) [name], MD as Attending Physician (Radiation Oncology) Jovita Kussmaul, MD as Consulting Physician (General Surgery) Mauro Kaufmann, RN as Oncology Nurse Navigator Rockwell Germany, RN as Oncology Nurse Navigator  This Provider for this visit: Treatment Team:  Attending Provider: Brand Males, MD    07/11/2021 -   Chief Complaint  Patient presents with   Consult    Consult for lung infection.Pt states that she has some issues with difficultly breathing and sinus issues noted. This has been occurring for 8-9 years now      HPI Jessica Gilbert 76 y.o. -as a new consultation referred by her now retired Dr. Jana Hakim.  Patient has had multiple cancers.  In 2005 she had lung cancer with right upper lobectomy by Dr. Jearld Fenton who has since retired.  Then in 2017 and 2020 when she has had breast cancer.  Both are under remission and she is on observation.  She has a long history of COPD for over 10 years.  She quit smoking many years ago.  She is on [place].  She does not like Respimat.  She has significant dyspnea on exertion relieved by rest for exertion.  Probably class II-3.  It is stable according to her.  However towards the end of 2022 when she saw Dr. Jana Hakim she had a variation in her shortness of breath.  In addition she has had chronic sinus complaints and she felt that the sinus drainage was worse in the last 1 year.  She is not on any treatment for this.  Because of this she had a CT scan of the chest.  This shows left upper lobe [contralateral to the previous lung cancer] greater than 1 cm cavity in the left apex posteriorly.  In the right below  that some of the smaller nodules.  Radiologist feels these are infection.  The apical 1 to me on my personal visualization looks like cavitary nodule.  Given the previous history of cancers.  Is concerning for malignancy.  Her last PFT was some years ago.  It is personally reviewed and visualized.   CT Chest data 05/07/21  Narrative & Impression  CLINICAL DATA:  76 year old female with history of breast cancer with history of weight loss. Follow-up study.   EXAM: CT CHEST WITH CONTRAST   TECHNIQUE: Multidetector CT imaging of the chest was performed during intravenous contrast administration.   CONTRAST:  12m OMNIPAQUE IOHEXOL 350 MG/ML SOLN   COMPARISON:  Chest CT 03/14/2014.   FINDINGS: Cardiovascular: Heart size is normal. Small amount of anterior pericardial fluid and/or thickening, unlikely to be of any hemodynamic significance at this time. No associated pericardial calcification. There is aortic atherosclerosis, as well as atherosclerosis of the great vessels of the mediastinum and the coronary arteries, including calcified atherosclerotic plaque in the left main, left anterior descending, left circumflex and right coronary arteries.   Mediastinum/Nodes: No pathologically enlarged mediastinal, internal mammary or hilar lymph nodes. Esophagus is unremarkable in appearance. No axillary or subpectoral lymphadenopathy.   Lungs/Pleura: Status post right upper lobectomy. Compensatory hyperexpansion of the right middle and lower lobes. In the left upper lobe there are numerous nodular areas  of architectural distortion, particularly near the apex. The largest of these has some internal cavitation (axial image 21 of series 7) measuring up to 1.5 x 1.2 x 1.7 cm, and is in contact with the pleural surface superiorly. Several nearby satellite nodules are noted in the left upper lobe, largest of which measures only 7 x 6 mm (axial image 36 of series 7). No acute consolidative  airspace disease. No pleural effusions. Diffuse bronchial wall thickening with severe centrilobular and paraseptal emphysema.   Upper Abdomen: Aortic atherosclerosis.   Musculoskeletal: There are no aggressive appearing lytic or blastic lesions noted in the visualized portions of the skeleton.   IMPRESSION: 1. Multiple new nodular areas of architectural distortion in the left upper lobe near the apex, largest of which has some internal cavitation. These are nonspecific, and favored to be of infectious or inflammatory etiology, however, underlying neoplasm is difficult to entirely exclude. Further clinical evaluation is recommended. Consideration for short-term trial of antimicrobial therapy followed by repeat noncontrast chest CT in 3-4 weeks is suggested for initial evaluation. PET-CT is not encouraged, as the likelihood of a false-positive examination in the setting of infection is very high. 2. Diffuse bronchial wall thickening with severe centrilobular and paraseptal emphysema; imaging findings suggestive of underlying COPD. 3. Aortic atherosclerosis, in addition to left main and 3 vessel coronary artery disease. Assessment for potential risk factor modification, dietary therapy or pharmacologic therapy may be warranted, if clinically indicated.   These results will be called to the ordering clinician or representative by the Radiologist Assistant, and communication documented in the PACS or [employer].   Aortic Atherosclerosis (ICD10-I70.0) and Emphysema (ICD10-J43.9).     Electronically Signed   By: Vinnie Langton M.D.   On: 05/08/2021 05:21    No results found.    PFT  PFT Results Latest Ref Rng & Units 02/16/2014  FVC-Pre L 2.16  FVC-Predicted Pre % 72  FVC-Post L 2.27  FVC-Predicted Post % 76  Pre FEV1/FVC % % 35  Post FEV1/FCV % % 36  FEV1-Pre L 0.76  FEV1-Predicted Pre % 33  FEV1-Post L 0.82  DLCO uncorrected ml/min/mmHg 12.06  DLCO UNC% % 52   DLVA Predicted % 57  TLC L 6.03  TLC % Predicted % 123  RV % Predicted % 159       has a past medical history of Acute respiratory failure ([place]), Breast cancer ([place]), Carotid stenosis, right, Celiac disease, Emphysema lung ([place]), History of hiatal hernia, Human metapneumovirus pneumonia (March 2015), Hyperlipidemia, Hypertension, Lung cancer ([place]) (2005), and Stroke [facility]).   reports that she quit smoking about 17 years ago. Her smoking use included cigarettes. She has a 35.00 pack-year smoking history. She has never used smokeless tobacco.  Past Surgical History:  Procedure Laterality Date   BREAST LUMPECTOMY     left    BREAST LUMPECTOMY WITH RADIOACTIVE SEED AND SENTINEL LYMPH NODE BIOPSY Left 07/17/2015   Procedure: LEFT BREAST LUMPECTOMY WITH RADIOACTIVE SEED WITH LEFT AXILLARY SENTINEL LYMPH NODE BIOPSY;  Surgeon: [name], MD;  Location: [facility];  Service: General;  Laterality: Left;   BREAST LUMPECTOMY WITH RADIOACTIVE SEED AND SENTINEL LYMPH NODE BIOPSY Left 05/28/2020   Procedure: LEFT BREAST LUMPECTOMY WITH RADIOACTIVE SEED AND SENTINEL LYMPH NODE BIOPSY;  Surgeon: Jovita Kussmaul, MD;  Location: [facility];  Service: General;  Laterality: Left;   LUNG REMOVAL, PARTIAL Right 2005   Upper lobe   TRIGGER FINGER RELEASE  TUBAL LIGATION  1980    Allergies  Allergen Reactions   Aspirin Other (See Comments)    "burning stomach"   Codeine Nausea Only   Vitamin D Analogs Other (See Comments)    Immunization History  Administered Date(s) Administered   Fluad Quad(high Dose 65+) 03/15/2016   Influenza Split 03/16/2013, 04/11/2013, 03/15/2014   Influenza, High Dose Seasonal PF 04/22/2015, 03/24/2017, 04/02/2018, 04/05/2019, 04/03/2020, 03/23/2021   Influenza-Unspecified 03/15/2014   Moderna Sars-Covid-2 Vaccination 08/16/2019, 09/14/2019, 08/31/2020   Pneumococcal Polysaccharide-23 02/19/2012    Family History  Problem Relation Age  of Onset   Heart disease Father    High blood pressure Father    Congestive Heart Failure Father    Leukemia Mother    Colon polyps Mother    High blood pressure Brother    Lung cancer Brother    Heart disease Brother    Diabetes Brother    Colon polyps Sister    Breast cancer Sister    High blood pressure Sister    Emphysema Sister    Pancreatic cancer Paternal Uncle      Current Outpatient Medications:    albuterol (VENTOLIN HFA) 108 (90 Base) MCG/ACT inhaler, Inhale into the lungs every 6 (six) hours as needed for wheezing or shortness of breath., Disp: , Rfl:    diphenhydrAMINE (BENADRYL) 25 MG tablet, Take 25 mg by mouth every 8 (eight) hours as needed., Disp: , Rfl:    guaiFENesin (MUCINEX) 600 MG 12 hr tablet, Take by mouth 2 (two) times daily., Disp: , Rfl:    ibuprofen (ADVIL,MOTRIN) 200 MG tablet, Take 200 mg by mouth every 6 (six) hours as needed., Disp: , Rfl:    mometasone-formoterol (DULERA) 200-5 MCG/ACT AERO, Inhale 2 puffs into the lungs 2 (two) times daily., Disp: 1 Inhaler, Rfl: 5   Multiple Vitamin (MULTIVITAMIN WITH MINERALS) TABS tablet, Take 1 tablet by mouth daily., Disp: , Rfl:    telmisartan (MICARDIS) 40 MG tablet, Take 40 mg by mouth daily., Disp: , Rfl:    telmisartan (MICARDIS) 40 MG tablet, Take 1 tablet by mouth daily., Disp: , Rfl:    Multiple Vitamin (MULTIVITAMIN) capsule, Take 1 capsule by mouth daily., Disp: , Rfl:       Objective:   Vitals:   07/11/21 1106  BP: 124/70  Pulse: 74  Temp: 98.4 F (36.9 C)  TempSrc: Oral  SpO2: 93%  Weight: 111 lb 12.8 oz (50.7 kg)  Height: 5' 4"  (1.626 m)    Estimated body mass index is 19.19 kg/m as calculated from the following:   Height as of this encounter: 5' 4"  (1.626 m).   Weight as of this encounter: 111 lb 12.8 oz (50.7 kg).  @WEIGHTCHANGE @  [employer]   07/11/21 1106  Weight: 111 lb 12.8 oz (50.7 kg)     Physical Exam General: No distress.  Cachectic female.  Using accessory  muscles of respiration even at baseline. Neuro: Alert and Oriented x 3. GCS 15. Speech normal Psych: Pleasant Resp:  Barrel Chest -mild barrel chest yes.  Wheeze - no, Crackles - no, No overt respiratory distress.  Right shoulder droop CVS: Normal heart sounds. Murmurs - no Ext: Stigmata of Connective Tissue Disease - no HEENT: Normal upper airway. PEERL +. No post nasal drip        Assessment:       ICD-10-CM   1. Nodule of apex of left lung  R91.1 QuantiFERON-TB Gold Plus    Rheumatoid Factor    Cyclic  citrul peptide antibody, IgG    ANA+ENA+DNA/DS+Scl 70+SjoSSA/B    Sed Rate (ESR)    Pulmonary function test    Sed Rate (ESR)    ANA+ENA+DNA/DS+Scl 35+[place]UO/H    Cyclic citrul peptide antibody, IgG    Rheumatoid Factor    QuantiFERON-TB Gold Plus    2. Multiple lung nodules on CT  R91.8 QuantiFERON-TB Gold Plus    Rheumatoid Factor    Cyclic citrul peptide antibody, IgG    ANA+ENA+DNA/DS+Scl 70+SjoSSA/B    Sed Rate (ESR)    Pulmonary function test    Sed Rate (ESR)    ANA+ENA+DNA/DS+Scl 72+BMSXJD/B    Cyclic citrul peptide antibody, IgG    Rheumatoid Factor    QuantiFERON-TB Gold Plus    3. Chronic obstructive pulmonary disease, unspecified COPD type ([place])  J44.9 Alpha-1 antitrypsin phenotype    Alpha-1 antitrypsin phenotype    4. DOE (dyspnea on exertion)  R06.09 ECHOCARDIOGRAM COMPLETE    5. Chronic sinus complaints  R09.89     6. History of secondary lung cancer  Z85.89     7. History of lobectomy of lung  Z90.2     8. Malignant neoplasm of unspecified part of unspecified bronchus or lung (H[facility])  C34.90 NM PET Image Initial (PI) Skull Base To Thigh         Plan:     Patient Instructions  Nodule of apex of left lung Multiple lung nodules on CT Hx of Lung cancer - right upper lobectomy 2005  - get blood test for Quantiferon gold, RF, CCP, ANA, DS-DNA, MPO, PR-3, ESR - get PET scan  Chronic obstructive pulmonary disease, unspecified COPD type  ([place]) DOE (dyspnea on exertion)  - time to restage lungs copd but noted dyspnea is stable over year but significant  Plan  - full PFT - blood alpha 1 AT phenotype - get echo - do not qualify for veracyte study  Chronic sinus complaints x worse x 1 year  Plan -  start take generic fluticasone inhaler 2 squirts each nostril daily - if not relief we  will get CT scan  Followup  - Eric Form APP to discuss results and decide next step     ( Level 05 visit: New 60-74 min   in  visit type: on-site physical face to visit  in total care time and counseling or/and coordination of care by this undersigned MD - Dr Brand Males. This includes one or more of the following on this same day 07/11/2021: pre-charting, chart review, note writing, documentation discussion of test results, diagnostic or treatment recommendations, prognosis, risks and benefits of management options, instructions, education, compliance or risk-factor reduction. It excludes time spent by the [facility] or office staff in the care of the patient. Actual time 43 min)  SIGNATURE    Dr. Brand Males, M.D., F.C.C.P,  Pulmonary and Critical Care Medicine Staff Physician, [place] Director - Interstitial Lung Disease  Program  Pulmonary [facility] [place], [place], [address]  Pager: [phone], If no answer or between  15:00h - 7:00h: call [phone] Telephone: [phone]  12:43 PM 07/11/2021

## 2021-07-12 ENCOUNTER — Ambulatory Visit: Payer: Medicare HMO | Admitting: Hematology and Oncology

## 2021-07-12 ENCOUNTER — Other Ambulatory Visit: Payer: Medicare HMO

## 2021-07-12 LAB — CYCLIC CITRUL PEPTIDE ANTIBODY, IGG: Cyclic Citrullin Peptide Ab: <16 UNITS

## 2021-07-12 LAB — RHEUMATOID FACTOR: Rheumatoid fact SerPl-aCnc: <14 IU/mL (ref <14)

## 2021-07-14 LAB — QUANTIFERON-TB GOLD PLUS
Mitogen-NIL: >10 IU/mL
NIL: 0.04 IU/mL
QuantiFERON-TB Gold Plus: NEGATIVE
TB1-NIL: 0 IU/mL
TB2-NIL: 0 IU/mL

## 2021-07-18 LAB — ANA+ENA+DNA/DS+SCL 70+SJOSSA/B
ANA Titer 1: NEGATIVE
ENA RNP Ab: <0.2 AI (ref 0.0–0.9)
ENA SM Ab Ser-aCnc: <0.2 AI (ref 0.0–0.9)
ENA SSA (RO) Ab: <0.2 AI (ref 0.0–0.9)
ENA SSB (LA) Ab: <0.2 AI (ref 0.0–0.9)
Scleroderma (Scl-70) (ENA) Antibody, IgG: <0.2 AI (ref 0.0–0.9)
dsDNA Ab: 1 IU/mL (ref 0–9)

## 2021-07-22 ENCOUNTER — Ambulatory Visit (HOSPITAL_COMMUNITY)
Admission: RE | Admit: 2021-07-22 | Discharge: 2021-07-22 | Disposition: A | Discharge status: Home / Self Care | Payer: Medicare HMO | Source: Ambulatory Visit | Attending: Internal Medicine | Admitting: Internal Medicine

## 2021-07-22 ENCOUNTER — Other Ambulatory Visit: Payer: Self-pay

## 2021-07-22 DIAGNOSIS — R918 Other nonspecific abnormal finding of lung field: Secondary | ICD-10-CM | POA: Diagnosis not present

## 2021-07-22 DIAGNOSIS — I2510 Atherosclerotic heart disease of native coronary artery without angina pectoris: Secondary | ICD-10-CM | POA: Diagnosis not present

## 2021-07-22 DIAGNOSIS — I700 Atherosclerosis of aorta: Secondary | ICD-10-CM | POA: Diagnosis not present

## 2021-07-22 DIAGNOSIS — C3490 Malignant neoplasm of unspecified part of unspecified bronchus or lung: Secondary | ICD-10-CM | POA: Insufficient documentation

## 2021-07-22 DIAGNOSIS — J439 Emphysema, unspecified: Secondary | ICD-10-CM | POA: Diagnosis not present

## 2021-07-22 LAB — GLUCOSE, CAPILLARY: Glucose-Capillary: 100 mg/dL — ABNORMAL HIGH (ref 70–99)

## 2021-07-22 MED ORDER — FLUDEOXYGLUCOSE F - 18 (FDG) INJECTION
5.5000 | Freq: Once | INTRAVENOUS | Status: AC
  Administered 2021-07-22: 5.5 via INTRAVENOUS

## 2021-07-24 LAB — ALPHA-1 ANTITRYPSIN PHENOTYPE: A-1 Antitrypsin, Ser: 179 mg/dL (ref 83–199)

## 2021-07-25 DIAGNOSIS — Z853 Personal history of malignant neoplasm of breast: Secondary | ICD-10-CM | POA: Diagnosis not present

## 2021-07-25 DIAGNOSIS — Z86010 Personal history of colonic polyps: Secondary | ICD-10-CM | POA: Diagnosis not present

## 2021-07-25 DIAGNOSIS — R918 Other nonspecific abnormal finding of lung field: Secondary | ICD-10-CM | POA: Diagnosis not present

## 2021-07-25 DIAGNOSIS — R634 Abnormal weight loss: Secondary | ICD-10-CM | POA: Diagnosis not present

## 2021-07-25 DIAGNOSIS — Z85118 Personal history of other malignant neoplasm of bronchus and lung: Secondary | ICD-10-CM | POA: Diagnosis not present

## 2021-07-25 DIAGNOSIS — R0602 Shortness of breath: Secondary | ICD-10-CM | POA: Diagnosis not present

## 2021-07-26 ENCOUNTER — Telehealth: Payer: Self-pay | Admitting: Hematology and Oncology

## 2021-07-26 NOTE — Telephone Encounter (Signed)
Sch per 2/9 inbasket, pt aware

## 2021-07-30 ENCOUNTER — Encounter (HOSPITAL_COMMUNITY): Payer: Self-pay

## 2021-07-31 ENCOUNTER — Other Ambulatory Visit: Payer: Self-pay

## 2021-07-31 ENCOUNTER — Ambulatory Visit (HOSPITAL_COMMUNITY): Payer: Medicare HMO | Attending: Internal Medicine

## 2021-07-31 DIAGNOSIS — R0609 Other forms of dyspnea: Secondary | ICD-10-CM | POA: Diagnosis not present

## 2021-07-31 LAB — ECHOCARDIOGRAM COMPLETE
Area-P 1/2: 3.68 cm2
S' Lateral: 2.7 cm

## 2021-08-01 ENCOUNTER — Telehealth: Payer: Self-pay | Admitting: Internal Medicine

## 2021-08-01 NOTE — Telephone Encounter (Signed)
47m lingular nodule in nov 2022 was 361min Nov 2022 -> so grown and is PET hot BUT given rapid growth suggests against malignanty  LUL nodule is not pet hot  ECHO ok but one part of the heart muscle is not moving well though overall heart strong   Plan  Review these results when visiting with APP    SIGNATURE    Dr. MuBrand MalesM.D., F.C.C.P,  Pulmonary and Critical Care Medicine Staff Physician, [facility] - Interstitial Lung Disease  Program  [place] [id number] Number:  NPI #[id number]: [id number]: [phone] no answer  -> Check AMION or Try [phone] Telephone (clinical office): [phone] Telephone (research): [phone]  7:11 PM 08/01/2021   LABS    PULMONARY No results for input(s): PHART, PCO2ART, PO2ART, HCO3, TCO2, O2SAT in the last 168 hours.  Invalid input(s): PCO2, PO2  CBC No results for input(s): HGB, HCT, WBC, PLT in the last 168 hours.  COAGULATION No results for input(s): INR in the last 168 hours.  CARDIAC  No results for input(s): TROPONINI in the last 168 hours. No results for input(s): PROBNP in the last 168 hours.   CHEMISTRY No results for input(s): NA, K, CL, CO2, GLUCOSE, BUN, CREATININE, CALCIUM, MG, PHOS in the last 168 hours. CrCl cannot be calculated (Patient's most recent lab result is older than the maximum 21 days allowed.).   LIVER No results for input(s): AST, ALT, ALKPHOS, BILITOT, PROT, ALBUMIN, INR in the last 168 hours.   INFECTIOUS No results for input(s): LATICACIDVEN, PROCALCITON in the last 168 hours.   ENDOCRINE CBG (last 3)  No results for input(s): GLUCAP in the last 72 hours.       IMAGING x48h  - image(s) personally visualized  -   highlighted in bold ECHOCARDIOGRAM COMPLETE  Result Date: 07/31/2021    ECHOCARDIOGRAM REPORT   Patient Name:   Jessica DARRAHDate of Exam: 07/31/2021 Medical Rec #:  [id number]     Height:       64.0 in Accession #:    [id number]    Weight:       111.8 lb Date of Birth:  09/1945/10/28     BSA:          1.528 m Patient Age:    [age]        BP:           124/70 mmHg Patient Gender: F               HR:           84 bpm. Exam Location:  [address] Procedure: 2D Echo, 3D Echo, Cardiac Doppler, Color Doppler and Strain Analysis Indications:    R06.00 Dyspnea  History:        Patient has no prior history of Echocardiogram examinations.                 Stroke; Risk Factors:Hypertension and Dyslipidemia.  Sonographer:    NaWilford Sportsodgers-Jones RDCS Referring Phys: [id number] Barrett Goldie IMPRESSIONS  1. Basal septal hypokinesis     Global longitudinal strain -25.6%. Left ventricular ejection fraction, by estimation, is 60 to 65%. The left ventricle has normal function. Left ventricular diastolic parameters were normal.  2. Right ventricular systolic function is normal. The right ventricular size  is normal.  3. The mitral valve is normal in structure. Mild mitral valve regurgitation.  4. Tricuspid valve regurgitation is mild to moderate.  5. The aortic valve is tricuspid. Aortic valve regurgitation is trivial.  6. The inferior vena cava is normal in size with greater than 50% respiratory variability, suggesting right atrial pressure of 3 mmHg. FINDINGS  Left Ventricle: Basal septal hypokinesis Global longitudinal strain -25.6%. Left ventricular ejection fraction, by estimation, is 60 to 65%. The left ventricle has normal function. The left ventricular internal cavity size was normal in size. There is no left ventricular hypertrophy. Left ventricular diastolic parameters were normal. Right Ventricle: The right ventricular size is normal. Right vetricular wall thickness was not assessed. Right ventricular systolic function is normal. Left Atrium: Left atrial size was normal in size. Right Atrium: Right atrial size was normal in size.  Pericardium: There is no evidence of pericardial effusion. Mitral Valve: The mitral valve is normal in structure. Mild mitral valve regurgitation. Tricuspid Valve: The tricuspid valve is normal in structure. Tricuspid valve regurgitation is mild to moderate. Aortic Valve: The aortic valve is tricuspid. Aortic valve regurgitation is trivial. Pulmonic Valve: The pulmonic valve was normal in structure. Pulmonic valve regurgitation is not visualized. Aorta: The aortic root and ascending aorta are structurally normal, with no evidence of dilitation. Venous: The inferior vena cava is normal in size with greater than 50% respiratory variability, suggesting right atrial pressure of 3 mmHg. IAS/Shunts: No atrial level shunt detected by color flow Doppler.  LEFT VENTRICLE PLAX 2D LVIDd:         4.00 cm   Diastology LVIDs:         2.70 cm   LV e' medial:    8.81 cm/s LV PW:         0.60 cm   LV E/e' medial:  7.9 LV IVS:        0.60 cm   LV e' lateral:   10.60 cm/s LVOT diam:     1.90 cm   LV E/e' lateral: 6.5 LV SV:         45 LV SV Index:   29        2D Longitudinal Strain LVOT Area:     2.84 cm  2D Strain GLS (A2C):   -25.9 %                          2D Strain GLS (A3C):   -25.3 %                          2D Strain GLS (A4C):   -25.6 %                          2D Strain GLS Avg:     -25.6 %                           3D Volume EF:                          3D EF:        60 %                          LV EDV:       74 ml  LV ESV:       30 ml                          LV SV:        44 ml RIGHT VENTRICLE             IVC RV Basal diam:  3.00 cm     IVC diam: 1.00 cm RV S prime:     14.23 cm/s TAPSE (M-mode): 2.6 cm LEFT ATRIUM             Index        RIGHT ATRIUM          Index LA diam:        3.10 cm 2.03 cm/m   RA Area:     9.21 cm LA Vol (A2C):   20.0 ml 13.09 ml/m  RA Volume:   19.80 ml 12.96 ml/m LA Vol (A4C):   24.0 ml 15.71 ml/m LA Biplane Vol: 22.5 ml 14.73 ml/m  AORTIC VALVE LVOT Vmax:    80.53 cm/s LVOT Vmean:  53.267 cm/s LVOT VTI:    0.157 m  AORTA Ao Root diam: 3.40 cm Ao Asc diam:  3.40 cm MITRAL VALVE               TRICUSPID VALVE MV Area (PHT): 3.68 cm    TR Peak grad:   30.9 mmHg MV Decel Time: 206 msec    TR Vmax:        278.00 cm/s MV E velocity: 69.40 cm/s MV A velocity: 89.10 cm/s  SHUNTS MV E/A ratio:  0.78        Systemic VTI:  0.16 m                            Systemic Diam: 1.90 cm Dorris Carnes MD Electronically signed by Dorris Carnes MD Signature Date/Time: 07/31/2021/4:37:36 PM    Final      PET scan 07/22/21  Narrative & Impression  CLINICAL DATA:  Initial treatment strategy for left lung nodule. Personal history of right upper lobe lung carcinoma and breast carcinoma.   EXAM: NUCLEAR MEDICINE PET SKULL BASE TO THIGH   TECHNIQUE: 5.5 mCi F-18 FDG was injected intravenously. Full-ring PET imaging was performed from the skull base to thigh after the radiotracer. CT data was obtained and used for attenuation correction and anatomic localization.   Fasting blood glucose: 100 mg/dl   COMPARISON:  Chest CT on 05/07/2021   FINDINGS: Mediastinal blood-pool activity (background): SUV max = 2.1   Liver activity (reference): SUV max = N/A   NECK:  No hypermetabolic lymph nodes or masses.   Incidental CT findings:  None.   CHEST: No hypermetabolic lymph nodes. Several pulmonary nodules in the left lung apex persist, but show no significant change compared to most recent CT. The dominant 13 mm nodule in the posterior apex which shows central cavitation (image 11/8), is mildly hypermetabolic with SUV max of 2.9. The other smaller left apical nodules show low-grade radiotracer uptake but are not hypermetabolic.   A 9 x 5 mm pulmonary nodule is seen in the lingula on image 53.8, which has increased in size from 3 mm previously, and shows hypermetabolic activity with SUV max of 6.3.   Incidental CT findings: Stable postop changes are seen from previous right  upper lobectomy. Moderate emphysema is again noted. Aortic and coronary atherosclerotic calcification noted.  ABDOMEN/PELVIS: No abnormal hypermetabolic activity within the liver, pancreas, adrenal glands, or spleen. No hypermetabolic lymph nodes in the abdomen or pelvis.   Incidental CT findings:  None.   SKELETON: No focal hypermetabolic bone lesions to suggest skeletal metastasis.   Incidental CT findings:  None.   IMPRESSION: Persistent pulmonary nodules in left lung apex, with dominant 13 mm cavitary nodule showing mild hypermetabolic activity. These remain indeterminate, and may be infectious or inflammatory in etiology, although malignancy cannot be excluded.   Increased size of 9 mm hypermetabolic pulmonary nodule in lingula. Given short interval from prior chest CT, this may represent an infectious or inflammatory etiology, although malignancy cannot be excluded.   No evidence of malignancy within the neck, abdomen, or pelvis.     Electronically Signed   By: Marlaine Hind M.D.   On: 07/23/2021 12:20

## 2021-08-02 NOTE — Telephone Encounter (Signed)
Called and spoke with pt letting her know the info stated by MR and stated to her that all will be further discussed in detail at upcoming appt. Pt verbalized understanding. Nothing further needed.

## 2021-08-16 ENCOUNTER — Ambulatory Visit (INDEPENDENT_AMBULATORY_CARE_PROVIDER_SITE_OTHER): Payer: Medicare HMO | Admitting: Internal Medicine

## 2021-08-16 ENCOUNTER — Other Ambulatory Visit: Payer: Self-pay

## 2021-08-16 ENCOUNTER — Encounter: Payer: Self-pay | Admitting: Acute Care

## 2021-08-16 ENCOUNTER — Ambulatory Visit: Payer: Medicare HMO | Admitting: Acute Care

## 2021-08-16 ENCOUNTER — Telehealth: Payer: Self-pay | Admitting: Acute Care

## 2021-08-16 VITALS — BP 132/84 | HR 84 | Ht 64.0 in | Wt 110.6 lb

## 2021-08-16 DIAGNOSIS — J449 Chronic obstructive pulmonary disease, unspecified: Secondary | ICD-10-CM

## 2021-08-16 DIAGNOSIS — J432 Centrilobular emphysema: Secondary | ICD-10-CM | POA: Diagnosis not present

## 2021-08-16 DIAGNOSIS — R911 Solitary pulmonary nodule: Secondary | ICD-10-CM

## 2021-08-16 DIAGNOSIS — R0609 Other forms of dyspnea: Secondary | ICD-10-CM

## 2021-08-16 DIAGNOSIS — Z853 Personal history of malignant neoplasm of breast: Secondary | ICD-10-CM | POA: Diagnosis not present

## 2021-08-16 DIAGNOSIS — Z85118 Personal history of other malignant neoplasm of bronchus and lung: Secondary | ICD-10-CM | POA: Diagnosis not present

## 2021-08-16 DIAGNOSIS — R918 Other nonspecific abnormal finding of lung field: Secondary | ICD-10-CM

## 2021-08-16 LAB — PULMONARY FUNCTION TEST
DL/VA % pred: 54 %
DL/VA: 2.25 ml/min/mmHg/L
DLCO cor % pred: 45 %
DLCO cor: 8.4 ml/min/mmHg
DLCO unc % pred: 45 %
DLCO unc: 8.4 ml/min/mmHg
FEF 25-75 Post: 0.32 L/sec
FEF 25-75 Pre: 0.28 L/sec
FEF2575-%Change-Post: 13 %
FEF2575-%Pred-Post: 19 %
FEF2575-%Pred-Pre: 17 %
FEV1-%Change-Post: 9 %
FEV1-%Pred-Post: 32 %
FEV1-%Pred-Pre: 29 %
FEV1-Post: 0.67 L
FEV1-Pre: 0.61 L
FEV1FVC-%Change-Post: 5 %
FEV1FVC-%Pred-Pre: 42 %
FEV6-%Change-Post: 5 %
FEV6-%Pred-Post: 70 %
FEV6-%Pred-Pre: 66 %
FEV6-Post: 1.83 L
FEV6-Pre: 1.73 L
FEV6FVC-%Change-Post: 3 %
FEV6FVC-%Pred-Post: 98 %
FEV6FVC-%Pred-Pre: 95 %
FVC-%Change-Post: 3 %
FVC-%Pred-Post: 72 %
FVC-%Pred-Pre: 70 %
FVC-Post: 1.98 L
FVC-Pre: 1.91 L
Post FEV1/FVC ratio: 34 %
Post FEV6/FVC ratio: 94 %
Pre FEV1/FVC ratio: 32 %
Pre FEV6/FVC Ratio: 91 %
RV % pred: 161 %
RV: 3.58 L
TLC % pred: 117 %
TLC: 5.77 L

## 2021-08-16 MED ORDER — BREZTRI AEROSPHERE 160-9-4.8 MCG/ACT IN AERO: 2.0000 | INHALATION_SPRAY | Freq: Two times a day (BID) | RESPIRATORY_TRACT | 0 refills | Status: DC

## 2021-08-16 NOTE — Patient Instructions (Addendum)
It is good to see you today. ?Please use your flutter valve several times a day. ?Blow 8-10 times into the flutter valve several times a day for chest congestion ?We will send in sputum cultures.  ?We will give you some specimen cups.  ?Return to the lab in the office within 4 hours of collection. ?We will let you know when the results come back.  ?We will do a 3 month  follow up CT Chest without contrast in may 2023 to re-evaluate the pulmonary nodule. ?Continue Dulera 2 puffs twice daily. ?Rinse mouth after use ?Use albuterol as needed for breakthrough shortness of breath or wheezing.  ?Follow up after CT Chest in 3 months with Dr. Chase Caller or Judson Roch NP.  ?PFT's did show COPD, severe.  ?We will do a therapeutic trial of Breztri ?2 puffs in the morning and 2 puffs in the evening.  ?Rinse mouth after use. ?Use this in place of [facility].  ?If you find no benefit, ok to resume Dulera. ?If you like it, call the office and we will send in a prescription for it.  ?Please contact office for sooner follow up if symptoms do not improve or worsen or seek emergency care   ? ?

## 2021-08-16 NOTE — Progress Notes (Signed)
History of Present Illness Jessica Gilbert is a 76 y.o. female former smoker , quit 2005 with a 35+ pack year smoking history , with COPD, severe emphysema per imaging, and malignant neoplasm of upper lobe of right lung in 2005, breast cancer in 2017 and 2020 both of which are in remission, COPD, and dyspnea on exertion, and chronic sinusitis.  Most recent chest imaging shows multiple new nodular areas of architectural distortion in the left upper lobe near the apex the largest of which has some internal cavitation, suspected infectious and inflammatory.  She is followed by Dr. Chase Caller Maintenance is [facility] and Albuterol  08/16/2021 Pt. Presents for follow up.  She had PFTs this morning which endorsed severe obstruction, and reduced DLCO.  Patient refuses to believe that she has COPD, insisting that she has asthma and that smoking did not contribute to her current pulmonary situation.  She endorses a lot of thick secretions, they are creamy to white. She has not been using her flutter valve.  She has not been using her Mucinex.  She did start using Flonase per Dr. Golden Pop instructions, and states this is helped a lot with her sinus symptoms.  Patient continues to endorse shortness of breath with exertion.  She is compliant with her Dulera and her albuterol.  Saturations today upon evaluation in the office were 92% on room air.  Patient weight is 110 pounds 9.6 ounces today, BMI is 18.98 kg/m Last office visit 07/11/2021 patient weight was 111 pounds 12.8 ounces, BMI was 19.9 kg/m  Test Results: PFTs 08/16/2021         PET scan 07/22/2021 Persistent pulmonary nodules in left lung apex, with dominant 13 mm cavitary nodule showing mild hypermetabolic activity. These remain indeterminate, and may be infectious or inflammatory in etiology, although malignancy cannot be excluded.   Increased size of 9 mm hypermetabolic pulmonary nodule in lingula. Given short interval from prior chest CT,  this may represent an infectious or inflammatory etiology, although malignancy cannot be excluded.   No evidence of malignancy within the neck, abdomen, or pelvis.        CT chest 11/22 1. Multiple new nodular areas of architectural distortion in the left upper lobe near the apex, largest of which has some internal cavitation. These are nonspecific, and favored to be of infectious or inflammatory etiology, however, underlying neoplasm is difficult to entirely exclude. Further clinical evaluation is recommended. Consideration for short-term trial of antimicrobial therapy followed by repeat noncontrast chest CT in 3-4 weeks is suggested for initial evaluation. PET-CT is not encouraged, as the likelihood of a false-positive examination in the setting of infection is very high. 2. Diffuse bronchial wall thickening with severe centrilobular and paraseptal emphysema; imaging findings suggestive of underlying COPD.    CBC Latest Ref Rng & Units 04/25/2021 07/12/2020 05/15/2020  WBC 4.0 - 10.5 K/uL 9.6 10.6(H) 10.9(H)  Hemoglobin 12.0 - 15.0 g/dL 14.0 14.3 13.7  Hematocrit 36.0 - 46.0 % 43.3 43.9 41.4  Platelets 150 - 400 K/uL 308 322 330    BMP Latest Ref Rng & Units 04/25/2021 07/12/2020 05/15/2020  Glucose 70 - 99 mg/dL 118(H) 120(H) 120(H)  BUN 8 - 23 mg/dL 11 17 10   Creatinine 0.44 - 1.00 mg/dL 0.88 0.96 0.84  Sodium 135 - 145 mmol/L 140 142 142  Potassium 3.5 - 5.1 mmol/L 4.3 4.5 3.8  Chloride 98 - 111 mmol/L 104 104 103  CO2 22 - 32 mmol/L 26 28 27   Calcium 8.9 -  10.3 mg/dL 10.0 9.5 9.6    BNP No results found for: BNP  ProBNP    Component Value Date/Time   PROBNP 118.5 08/28/2013 0937    PFT    Component Value Date/Time   FEV1PRE 0.61 08/16/2021 1049   FEV1POST 0.67 08/16/2021 1049   FVCPRE 1.91 08/16/2021 1049   FVCPOST 1.98 08/16/2021 1049   TLC 5.77 08/16/2021 1049   DLCOUNC 8.40 08/16/2021 1049   PREFEV1FVCRT 32 08/16/2021 1049   PSTFEV1FVCRT 34  08/16/2021 1049    NM PET Image Initial (PI) Skull Base To Thigh  Result Date: 07/23/2021 CLINICAL DATA:  Initial treatment strategy for left lung nodule. Personal history of right upper lobe lung carcinoma and breast carcinoma. EXAM: NUCLEAR MEDICINE PET SKULL BASE TO THIGH TECHNIQUE: 5.5 mCi F-18 FDG was injected intravenously. Full-ring PET imaging was performed from the skull base to thigh after the radiotracer. CT data was obtained and used for attenuation correction and anatomic localization. Fasting blood glucose: 100 mg/dl COMPARISON:  Chest CT on 05/07/2021 FINDINGS: Mediastinal blood-pool activity (background): SUV max = 2.1 Liver activity (reference): SUV max = N/A NECK:  No hypermetabolic lymph nodes or masses. Incidental CT findings:  None. CHEST: No hypermetabolic lymph nodes. Several pulmonary nodules in the left lung apex persist, but show no significant change compared to most recent CT. The dominant 13 mm nodule in the posterior apex which shows central cavitation (image 11/8), is mildly hypermetabolic with SUV max of 2.9. The other smaller left apical nodules show low-grade radiotracer uptake but are not hypermetabolic. A 9 x 5 mm pulmonary nodule is seen in the lingula on image 53.8, which has increased in size from 3 mm previously, and shows hypermetabolic activity with SUV max of 6.3. Incidental CT findings: Stable postop changes are seen from previous right upper lobectomy. Moderate emphysema is again noted. Aortic and coronary atherosclerotic calcification noted. ABDOMEN/PELVIS: No abnormal hypermetabolic activity within the liver, pancreas, adrenal glands, or spleen. No hypermetabolic lymph nodes in the abdomen or pelvis. Incidental CT findings:  None. SKELETON: No focal hypermetabolic bone lesions to suggest skeletal metastasis. Incidental CT findings:  None. IMPRESSION: Persistent pulmonary nodules in left lung apex, with dominant 13 mm cavitary nodule showing mild hypermetabolic  activity. These remain indeterminate, and may be infectious or inflammatory in etiology, although malignancy cannot be excluded. Increased size of 9 mm hypermetabolic pulmonary nodule in lingula. Given short interval from prior chest CT, this may represent an infectious or inflammatory etiology, although malignancy cannot be excluded. No evidence of malignancy within the neck, abdomen, or pelvis. Electronically Signed   By: Marlaine Hind M.D.   On: 07/23/2021 12:20   ECHOCARDIOGRAM COMPLETE  Result Date: 07/31/2021    ECHOCARDIOGRAM REPORT   Patient Name:   SUBRENA DEVEREUX Date of Exam: 07/31/2021 Medical Rec #:  [id number]       Height:       64.0 in Accession #:    [id number]      Weight:       111.8 lb Date of Birth:  06-04-1946       BSA:          1.528 m Patient Age:    10 years        BP:           124/70 mmHg Patient Gender: F               HR:           84  bpm. Exam Location:  [address] Procedure: 2D Echo, 3D Echo, Cardiac Doppler, Color Doppler and Strain Analysis Indications:    R06.00 Dyspnea  History:        Patient has no prior history of Echocardiogram examinations.                 Stroke; Risk Factors:Hypertension and Dyslipidemia.  Sonographer:    Wilford Sports Rodgers-Jones RDCS Referring Phys: [id number] MURALI RAMASWAMY IMPRESSIONS  1. Basal septal hypokinesis     Global longitudinal strain -25.6%. Left ventricular ejection fraction, by estimation, is 60 to 65%. The left ventricle has normal function. Left ventricular diastolic parameters were normal.  2. Right ventricular systolic function is normal. The right ventricular size is normal.  3. The mitral valve is normal in structure. Mild mitral valve regurgitation.  4. Tricuspid valve regurgitation is mild to moderate.  5. The aortic valve is tricuspid. Aortic valve regurgitation is trivial.  6. The inferior vena cava is normal in size with greater than 50% respiratory variability, suggesting right atrial pressure of 3 mmHg. FINDINGS  Left Ventricle:  Basal septal hypokinesis Global longitudinal strain -25.6%. Left ventricular ejection fraction, by estimation, is 60 to 65%. The left ventricle has normal function. The left ventricular internal cavity size was normal in size. There is no left ventricular hypertrophy. Left ventricular diastolic parameters were normal. Right Ventricle: The right ventricular size is normal. Right vetricular wall thickness was not assessed. Right ventricular systolic function is normal. Left Atrium: Left atrial size was normal in size. Right Atrium: Right atrial size was normal in size. Pericardium: There is no evidence of pericardial effusion. Mitral Valve: The mitral valve is normal in structure. Mild mitral valve regurgitation. Tricuspid Valve: The tricuspid valve is normal in structure. Tricuspid valve regurgitation is mild to moderate. Aortic Valve: The aortic valve is tricuspid. Aortic valve regurgitation is trivial. Pulmonic Valve: The pulmonic valve was normal in structure. Pulmonic valve regurgitation is not visualized. Aorta: The aortic root and ascending aorta are structurally normal, with no evidence of dilitation. Venous: The inferior vena cava is normal in size with greater than 50% respiratory variability, suggesting right atrial pressure of 3 mmHg. IAS/Shunts: No atrial level shunt detected by color flow Doppler.  LEFT VENTRICLE PLAX 2D LVIDd:         4.00 cm   Diastology LVIDs:         2.70 cm   LV e' medial:    8.81 cm/s LV PW:         0.60 cm   LV E/e' medial:  7.9 LV IVS:        0.60 cm   LV e' lateral:   10.60 cm/s LVOT diam:     1.90 cm   LV E/e' lateral: 6.5 LV SV:         45 LV SV Index:   29        2D Longitudinal Strain LVOT Area:     2.84 cm  2D Strain GLS (A2C):   -25.9 %                          2D Strain GLS (A3C):   -25.3 %                          2D Strain GLS (A4C):   -25.6 %  2D Strain GLS Avg:     -25.6 %                           3D Volume EF:                          3D EF:         60 %                          LV EDV:       74 ml                          LV ESV:       30 ml                          LV SV:        44 ml RIGHT VENTRICLE             IVC RV Basal diam:  3.00 cm     IVC diam: 1.00 cm RV S prime:     14.23 cm/s TAPSE (M-mode): 2.6 cm LEFT ATRIUM             Index        RIGHT ATRIUM          Index LA diam:        3.10 cm 2.03 cm/m   RA Area:     9.21 cm LA Vol (A2C):   20.0 ml 13.09 ml/m  RA Volume:   19.80 ml 12.96 ml/m LA Vol (A4C):   24.0 ml 15.71 ml/m LA Biplane Vol: 22.5 ml 14.73 ml/m  AORTIC VALVE LVOT Vmax:   80.53 cm/s LVOT Vmean:  53.267 cm/s LVOT VTI:    0.157 m  AORTA Ao Root diam: 3.40 cm Ao Asc diam:  3.40 cm MITRAL VALVE               TRICUSPID VALVE MV Area (PHT): 3.68 cm    TR Peak grad:   30.9 mmHg MV Decel Time: 206 msec    TR Vmax:        278.00 cm/s MV E velocity: 69.40 cm/s MV A velocity: 89.10 cm/s  SHUNTS MV E/A ratio:  0.78        Systemic VTI:  0.16 m                            Systemic Diam: 1.90 cm Dorris Carnes MD Electronically signed by Dorris Carnes MD Signature Date/Time: 07/31/2021/4:37:36 PM    Final      Past medical hx Past Medical History:  Diagnosis Date   Acute respiratory failure ([place])    Breast cancer ([place])    Carotid stenosis, right    Celiac disease    Emphysema lung ([place])    History of hiatal hernia    Human metapneumovirus pneumonia March 2015   Hyperlipidemia    Hypertension    Lung cancer ([place]) 2005   Rt upper lobe   Stroke ([place])    TIA w/o defecits     Social History   Tobacco Use   Smoking status: Former    Packs/day: 1.00    Years: 35.00    Pack years: 35.00    Types:  Cigarettes    Quit date: 01/29/2004    Years since quitting: 17.5   Smokeless tobacco: Never  Vaping Use   Vaping Use: Never used  Substance Use Topics   Alcohol use: Yes    Alcohol/week: 0.0 standard drinks    Comment: wine occasionally   Drug use: No    Ms.Engh reports that she quit smoking about 17 years ago. Her  smoking use included cigarettes. She has a 35.00 pack-year smoking history. She has never used smokeless tobacco. She reports current alcohol use. She reports that she does not use drugs.  Tobacco Cessation: Former smoker quit 2005 with a 35+ pack year smoking history   Past surgical hx, Family hx, Social hx all reviewed.  Current Outpatient Medications on File Prior to Visit  Medication Sig   albuterol (VENTOLIN HFA) 108 (90 Base) MCG/ACT inhaler Inhale into the lungs every 6 (six) hours as needed for wheezing or shortness of breath.   diphenhydrAMINE (BENADRYL) 25 MG tablet Take 25 mg by mouth every 8 (eight) hours as needed.   guaiFENesin (MUCINEX) 600 MG 12 hr tablet Take by mouth 2 (two) times daily.   ibuprofen (ADVIL,MOTRIN) 200 MG tablet Take 200 mg by mouth every 6 (six) hours as needed.   mometasone-formoterol (DULERA) 200-5 MCG/ACT AERO Inhale 2 puffs into the lungs 2 (two) times daily.   Multiple Vitamin (MULTIVITAMIN WITH MINERALS) TABS tablet Take 1 tablet by mouth daily.   Multiple Vitamin (MULTIVITAMIN) capsule Take 1 capsule by mouth daily.   telmisartan (MICARDIS) 40 MG tablet Take 40 mg by mouth daily.   telmisartan (MICARDIS) 40 MG tablet Take 1 tablet by mouth daily.   No current facility-administered medications on file prior to visit.     Allergies  Allergen Reactions   Aspirin Other (See Comments)    "burning stomach"   Codeine Nausea Only   Vitamin D Analogs Other (See Comments)    Review Of Systems:  Constitutional:   +  weight loss, Nonight sweats,  Fevers, chills, fatigue, or  lassitude.  HEENT:   No headaches,  Difficulty swallowing,  Tooth/dental problems, or  Sore throat,                No sneezing, itching, ear ache, nasal congestion, post nasal drip,   CV:  No chest pain,  Orthopnea, PND, swelling in lower extremities, anasarca, dizziness, palpitations, syncope.   GI  No heartburn, indigestion, abdominal pain, nausea, vomiting, diarrhea, change  in bowel habits, loss of appetite, bloody stools.   Resp: + shortness of breath with exertion less at rest.  + excess mucus, + productive cough,  No non-productive cough,  No coughing up of blood.  No change in color of mucus.  No wheezing.  No chest wall deformity  Skin: no rash or lesions.  GU: no dysuria, change in color of urine, no urgency or frequency.  No flank pain, no hematuria   MS:  No joint pain or swelling.  No decreased range of motion.  No back pain.  Psych:  No change in mood or affect. No depression or anxiety.  No memory loss.   Vital Signs BP 132/84    Pulse 84    Ht 5' 4"  (1.626 m)    Wt 110 lb 9.6 oz (50.2 kg)    SpO2 92%    BMI 18.98 kg/m    Physical Exam:  General- No distress,  A&Ox3, pleasant ENT: No sinus tenderness, TM clear, pale nasal mucosa, no oral  exudate, positive post nasal drip, no LAN Cardiac: S1, S2, regular rate and rhythm, no murmur Chest: No wheeze/ rales/ dullness; no accessory muscle use, no nasal flaring, no sternal retractions, diminished per bases Abd.: Soft Non-tender, nondistended, bowel sounds positive,Body mass index is 18.98 kg/m. Ext: No clubbing cyanosis, edema Neuro:  normal strength, moving all extremities x4, alert and oriented x3 Skin: No rashes, warm and dry, no lesions Psych: normal mood and behavior, states she is tired after the pulmonary function tests   Assessment/Plan History of lung cancer 2005 History of breast cancer 2017, 2020 Severe COPD Reduced DLCO Former smoker New pulmonary nodule concerning for recurrence versus infection inflammation Plan Please use your flutter valve several times a day. Blow 8-10 times into the flutter valve several times a day for chest congestion We will send in sputum cultures.  We will give you some specimen cups.  Return to the lab in the office within 4 hours of collection. We will let you know when the results come back.  Sputum for culture, fungal and AFB We will do a 3  month  follow up CT Chest without contrast in may 2023 to re-evaluate the pulmonary nodule. Continue Dulera 2 puffs twice daily. Rinse mouth after use Use albuterol as needed for breakthrough shortness of breath or wheezing.  Follow up after CT Chest in 3 months with Dr. Chase Caller or Judson Roch NP.  PFT's did show COPD, severe.  We will do a therapeutic trial of Breztri 2 puffs in the morning and 2 puffs in the evening.  Rinse mouth after use. Use this in place of [facility].  If you find no benefit, ok to resume Dulera. If you like it, call the office and we will send in a prescription for it.  Please contact office for sooner follow up if symptoms do not improve or worsen or seek emergency care    I am very concerned with patient's continued weight loss, history of both lung and breast cancer, that new imaging changes could be a slow-growing malignancy. Plan is for 69-monthfollow-up CT scan, I did message Dr. RChase Caller if he indicates he would like to recheck sooner we will do so.  I spent 60 minutes dedicated to the care of this patient on the date of this encounter to include pre-visit review of records, face-to-face time with the patient discussing conditions above, post visit ordering of testing, clinical documentation with the electronic health record, making appropriate referrals as documented, and communicating necessary information to the patient's healthcare team.     SMagdalen Spatz NP 08/16/2021  12:36 PM

## 2021-08-16 NOTE — Telephone Encounter (Signed)
Dr. Chase Caller, I saw this patient today I saw Jessica Gilbert today. PFT's confirm severe COPD. She is willing to try Breztri.  ?I am concerned about the lung nodule. We will do a 3 month follow CT chest to re-evaluate. Please let me know if you want this done sooner.  ?Thanks ?

## 2021-08-16 NOTE — Progress Notes (Signed)
Full PFT completed today ? ?

## 2021-08-21 ENCOUNTER — Telehealth: Payer: Self-pay | Admitting: Acute Care

## 2021-08-21 ENCOUNTER — Other Ambulatory Visit: Payer: Medicare HMO

## 2021-08-21 DIAGNOSIS — J449 Chronic obstructive pulmonary disease, unspecified: Secondary | ICD-10-CM | POA: Diagnosis not present

## 2021-08-21 DIAGNOSIS — R0609 Other forms of dyspnea: Secondary | ICD-10-CM | POA: Diagnosis not present

## 2021-08-21 NOTE — Telephone Encounter (Signed)
?  Ok to get CT chest 3 months from 3/07/22/21 PET scan ? ? ?PFT Results Latest Ref Rng & Units 08/16/2021 02/16/2014  ?FVC-Pre L 1.91 2.16  ?FVC-Predicted Pre % 70 72  ?FVC-Post L 1.98 2.27  ?FVC-Predicted Post % 72 76  ?Pre FEV1/FVC % % 32 35  ?Post FEV1/FCV % % 34 36  ?FEV1-Pre L 0.61 0.76  ?FEV1-Predicted Pre % 29 33  ?FEV1-Post L 0.67 0.82  ?DLCO uncorrected ml/min/mmHg 8.40 12.06  ?DLCO UNC% % 45 52  ?DLCO corrected ml/min/mmHg 8.40 -  ?DLCO COR %Predicted % 45 -  ?DLVA Predicted % 54 57  ?TLC L 5.77 6.03  ?TLC % Predicted % 117 123  ?RV % Predicted % 161 159  ? ? ? ?Narrative & Impression  ?CLINICAL DATA:  Initial treatment strategy for left lung nodule. ?Personal history of right upper lobe lung carcinoma and breast ?carcinoma. ?  ?EXAM: ?NUCLEAR MEDICINE PET SKULL BASE TO THIGH ?  ?TECHNIQUE: ?5.5 mCi F-18 FDG was injected intravenously. Full-ring PET imaging ?was performed from the skull base to thigh after the radiotracer. CT ?data was obtained and used for attenuation correction and anatomic ?localization. ?  ?Fasting blood glucose: 100 mg/dl ?  ?COMPARISON:  Chest CT on 05/07/2021 ?  ?FINDINGS: ?Mediastinal blood-pool activity (background): SUV max = 2.1 ?  ?Liver activity (reference): SUV max = N/A ?  ?NECK:  No hypermetabolic lymph nodes or masses. ?  ?Incidental CT findings:  None. ?  ?CHEST: No hypermetabolic lymph nodes. Several pulmonary nodules in ?the left lung apex persist, but show no significant change compared ?to most recent CT. The dominant 13 mm nodule in the posterior apex ?which shows central cavitation (image 11/8), is mildly ?hypermetabolic with SUV max of 2.9. The other smaller left apical ?nodules show low-grade radiotracer uptake but are not ?hypermetabolic. ?  ?A 9 x 5 mm pulmonary nodule is seen in the lingula on image 53.8, ?which has increased in size from 3 mm previously, and shows ?hypermetabolic activity with SUV max of 6.3. ?  ?Incidental CT findings: Stable postop changes are  seen from previous ?right upper lobectomy. Moderate emphysema is again noted. Aortic and ?coronary atherosclerotic calcification noted. ?  ?ABDOMEN/PELVIS: No abnormal hypermetabolic activity within the ?liver, pancreas, adrenal glands, or spleen. No hypermetabolic lymph ?nodes in the abdomen or pelvis. ?  ?Incidental CT findings:  None. ?  ?SKELETON: No focal hypermetabolic bone lesions to suggest skeletal ?metastasis. ?  ?Incidental CT findings:  None. ?  ?IMPRESSION: ?Persistent pulmonary nodules in left lung apex, with dominant 13 mm ?cavitary nodule showing mild hypermetabolic activity. These remain ?indeterminate, and may be infectious or inflammatory in etiology, ?although malignancy cannot be excluded. ?  ?Increased size of 9 mm hypermetabolic pulmonary nodule in lingula. ?Given short interval from prior chest CT, this may represent an ?infectious or inflammatory etiology, although malignancy cannot be ?excluded. ?  ?No evidence of malignancy within the neck, abdomen, or pelvis. ?  ?  ?Electronically Signed ?  By: Marlaine Hind M.D. ?  On: 07/23/2021 12:20  ? ?

## 2021-08-21 NOTE — Telephone Encounter (Signed)
I see in the note it shows that CT is due in 05/23 per Eric Form.  ?

## 2021-08-21 NOTE — Telephone Encounter (Signed)
Jessica Gilbert, we need to make sure this patient's follow up CT Chest is scheduled for may, 2023. ( 3 months after her PET scan she had done 07/22/2021).Thanks so much ?

## 2021-08-22 ENCOUNTER — Telehealth: Payer: Self-pay | Admitting: Acute Care

## 2021-08-22 NOTE — Telephone Encounter (Signed)
Called and spoke with patient. She stated that she wanted to see if the results were back for the sputum culture she dropped off yesterday. I advised her that it sometimes will take a week for the results to come back. She is aware that we will call her once we have received the results.  She verbalized understanding.  ? ?Nothing further needed at time of call.  ?

## 2021-08-23 NOTE — Telephone Encounter (Signed)
The order has been placed in the April CT folder in my office to be contacted about May CT.  ?

## 2021-08-24 LAB — RESPIRATORY CULTURE OR RESPIRATORY AND SPUTUM CULTURE
MICRO NUMBER:: 13103998
RESULT:: NORMAL
SPECIMEN QUALITY:: ADEQUATE

## 2021-08-27 NOTE — Telephone Encounter (Signed)
Thanks

## 2021-10-17 ENCOUNTER — Ambulatory Visit (HOSPITAL_COMMUNITY)
Admission: RE | Admit: 2021-10-17 | Discharge: 2021-10-17 | Disposition: A | Discharge status: Home / Self Care | Payer: Medicare HMO | Source: Ambulatory Visit | Attending: Acute Care | Admitting: Acute Care

## 2021-10-17 DIAGNOSIS — J439 Emphysema, unspecified: Secondary | ICD-10-CM | POA: Insufficient documentation

## 2021-10-17 DIAGNOSIS — R911 Solitary pulmonary nodule: Secondary | ICD-10-CM | POA: Diagnosis not present

## 2021-10-17 DIAGNOSIS — I2510 Atherosclerotic heart disease of native coronary artery without angina pectoris: Secondary | ICD-10-CM | POA: Insufficient documentation

## 2021-10-17 DIAGNOSIS — R918 Other nonspecific abnormal finding of lung field: Secondary | ICD-10-CM | POA: Diagnosis not present

## 2021-10-17 DIAGNOSIS — I700 Atherosclerosis of aorta: Secondary | ICD-10-CM | POA: Diagnosis not present

## 2021-11-17 NOTE — Progress Notes (Unsigned)
OV 07/11/2021  Subjective:  Patient ID: Jessica Gilbert, female , DOB: 1945-11-29 , age 76 y.o. , MRN: [id number] , ADDRESS: [address] [place] [address] PCP Vernie Shanks, MD Patient Care Team: Vernie Shanks, MD as PCP - General (Family Medicine) Magrinat, Virgie Dad, MD as Consulting Physician (Oncology) [name], MD as Attending Physician (Radiation Oncology) Jovita Kussmaul, MD as Consulting Physician (General Surgery) Mauro Kaufmann, RN as Oncology Nurse Navigator Rockwell Germany, RN as Oncology Nurse Navigator  This Provider for this visit: Treatment Team:  Attending Provider: Brand Males, MD    07/11/2021 -   Chief Complaint  Patient presents with   Consult    Consult for lung infection.Pt states that she has some issues with difficultly breathing and sinus issues noted. This has been occurring for 8-9 years now      HPI Jessica Gilbert 76 y.o. -as a new consultation referred by her now retired Dr. Jana Hakim.  Patient has had multiple cancers.  In 2005 she had lung cancer with right upper lobectomy by Dr. Jearld Fenton who has since retired.  Then in 2017 and 2020 when she has had breast cancer.  Both are under remission and she is on observation.  She has a long history of COPD for over 10 years.  She quit smoking many years ago.  She is on [place].  She does not like Respimat.  She has significant dyspnea on exertion relieved by rest for exertion.  Probably class II-3.  It is stable according to her.  However towards the end of 2022 when she saw Dr. Jana Hakim she had a variation in her shortness of breath.  In addition she has had chronic sinus complaints and she felt that the sinus drainage was worse in the last 1 year.  She is not on any treatment for this.  Because of this she had a CT scan of the chest.  This shows left upper lobe [contralateral to the previous lung cancer] greater than 1 cm cavity in the left apex posteriorly.  In the right below  that some of the smaller nodules.  Radiologist feels these are infection.  The apical 1 to me on my personal visualization looks like cavitary nodule.  Given the previous history of cancers.  Is concerning for malignancy.  Her last PFT was some years ago.  It is personally reviewed and visualized.   CT Chest data 05/07/21  Narrative & Impression  CLINICAL DATA:  76 year old female with history of breast cancer with history of weight loss. Follow-up study.   EXAM: CT CHEST WITH CONTRAST   TECHNIQUE: Multidetector CT imaging of the chest was performed during intravenous contrast administration.   CONTRAST:  37m OMNIPAQUE IOHEXOL 350 MG/ML SOLN   COMPARISON:  Chest CT 03/14/2014.   FINDINGS: Cardiovascular: Heart size is normal. Small amount of anterior pericardial fluid and/or thickening, unlikely to be of any hemodynamic significance at this time. No associated pericardial calcification. There is aortic atherosclerosis, as well as atherosclerosis of the great vessels of the mediastinum and the coronary arteries, including calcified atherosclerotic plaque in the left main, left anterior descending, left circumflex and right coronary arteries.   Mediastinum/Nodes: No pathologically enlarged mediastinal, internal mammary or hilar lymph nodes. Esophagus is unremarkable in appearance. No axillary or subpectoral lymphadenopathy.   Lungs/Pleura: Status post right upper lobectomy. Compensatory hyperexpansion of the right middle and lower lobes. In the left upper lobe there are numerous  nodular areas of architectural distortion, particularly near the apex. The largest of these has some internal cavitation (axial image 21 of series 7) measuring up to 1.5 x 1.2 x 1.7 cm, and is in contact with the pleural surface superiorly. Several nearby satellite nodules are noted in the left upper lobe, largest of which measures only 7 x 6 mm (axial image 36 of series 7). No acute consolidative  airspace disease. No pleural effusions. Diffuse bronchial wall thickening with severe centrilobular and paraseptal emphysema.   Upper Abdomen: Aortic atherosclerosis.   Musculoskeletal: There are no aggressive appearing lytic or blastic lesions noted in the visualized portions of the skeleton.   IMPRESSION: 1. Multiple new nodular areas of architectural distortion in the left upper lobe near the apex, largest of which has some internal cavitation. These are nonspecific, and favored to be of infectious or inflammatory etiology, however, underlying neoplasm is difficult to entirely exclude. Further clinical evaluation is recommended. Consideration for short-term trial of antimicrobial therapy followed by repeat noncontrast chest CT in 3-4 weeks is suggested for initial evaluation. PET-CT is not encouraged, as the likelihood of a false-positive examination in the setting of infection is very high. 2. Diffuse bronchial wall thickening with severe centrilobular and paraseptal emphysema; imaging findings suggestive of underlying COPD. 3. Aortic atherosclerosis, in addition to left main and 3 vessel coronary artery disease. Assessment for potential risk factor modification, dietary therapy or pharmacologic therapy may be warranted, if clinically indicated.   These results will be called to the ordering clinician or representative by the Radiologist Assistant, and communication documented in the PACS or [employer].   Aortic Atherosclerosis (ICD10-I70.0) and Emphysema (ICD10-J43.9).     Electronically Signed   By: Vinnie Langton M.D.   On: 05/08/2021 05:21      08/16/2021 Pt. Presents for follow up.  She had PFTs this morning which endorsed severe obstruction, and reduced DLCO.  Patient refuses to believe that she has COPD, insisting that she has asthma and that smoking did not contribute to her current pulmonary situation.  She endorses a lot of thick secretions, they are  creamy to white. She has not been using her flutter valve.  She has not been using her Mucinex.  She did start using Flonase per Dr. Golden Pop instructions, and states this is helped a lot with her sinus symptoms.  Patient continues to endorse shortness of breath with exertion.  She is compliant with her Dulera and her albuterol.  Saturations today upon evaluation in the office were 92% on room air.  Patient weight is 110 pounds 9.6 ounces today, BMI is 18.98 kg/m Last office visit 07/11/2021 patient weight was 111 pounds 12.8 ounces, BMI was 19.9 kg/m  OV 11/18/2021  Subjective:  Patient ID: Jessica Gilbert, female , DOB: 1946/01/28 , age 4 y.o. , MRN: [id number] , ADDRESS: [address] [place] [address] PCP Vernie Shanks, MD Patient Care Team: Vernie Shanks, MD as PCP - General (Family Medicine) Magrinat, Virgie Dad, MD (Inactive) as Consulting Physician (Oncology) [name], MD as Attending Physician (Radiation Oncology) Jovita Kussmaul, MD as Consulting Physician (General Surgery) Mauro Kaufmann, RN as Oncology Nurse Navigator Rockwell Germany, RN as Oncology Nurse Navigator  This Provider for this visit: Treatment Team:  Attending Provider: Brand Males, MD    11/18/2021 -   Chief Complaint  Patient presents with   Follow-up    Pt recently had a CT performed and is here today to go over  the results.     HPI JOYLENE WESCOTT 76 y.o. -   #COPD follow-up Spiriva in the past and did not tolerate that.  Nurse practitioner put her on bREZTRI but she went back to [facility].  Currently COPD is stable.  No new complaints.  #Pulmonary nodule: She has previous history of breast cancer and lung cancer.  Previous lobectomy on the right side.  She is now being followed for nodules.  She had PET scan that was indeterminate/slightly active.  This was then followed up with a CT scan of the chest a month ago.  This shows the left apical 1.6 cm nodule to be stable but the  lingular nodule has enlarged to 1.1 cm and it is cavitating.  She is aware this could be cancer given the prior history of cancer.  We discussed methods of diagnosis.  She definitely does not want a transthoracic CT-guided biopsy because of bad experience in the past.  I did explain to her that the lesion is too deep to even think about that approach.  We discussed bronchoscopy through navigational bronchoscopy and endobronchial ultrasound by our proceduralist Dr. Leory Plowman Icard/Robert Byrum.  She is willing to proceed. Risks of pneumothorax, hemothorax, sedation/anesthesia complications such as cardiac or respiratory arrest or hypotension, stroke and bleeding all explained. Benefits of diagnosis but limitations of non-diagnosis also explained. Patient verbalized understanding and wished to proceed.     PET scan 07/22/21  IMPRESSION: Persistent pulmonary nodules in left lung apex, with dominant 13 mm cavitary nodule showing mild hypermetabolic activity. These remain indeterminate, and may be infectious or inflammatory in etiology, although malignancy cannot be excluded.   Increased size of 9 mm hypermetabolic pulmonary nodule in lingula. Given short interval from prior chest CT, this may represent an infectious or inflammatory etiology, although malignancy cannot be excluded.   No evidence of malignancy within the neck, abdomen, or pelvis.     Electronically Signed   By: Marlaine Hind M.D.   On: 07/23/2021 12:20    CT Chest data 10/17/21  Narrative & Impression  CLINICAL DATA:  Abnormal chest radiograph, pulmonary nodule. Remote history of lung cancer treated with chemotherapy and radiation therapy as well as surgery. Former smoker.   EXAM: CT CHEST WITHOUT CONTRAST   TECHNIQUE: Multidetector CT imaging of the chest was performed following the standard protocol without IV contrast.   RADIATION DOSE REDUCTION: This exam was performed according to the departmental dose-optimization  program which includes automated exposure control, adjustment of the mA and/or kV according to patient size and/or use of iterative reconstruction technique.   COMPARISON:  PET 07/22/2021 and 05/07/2021.   FINDINGS: Cardiovascular: Atherosclerotic calcification of the aorta, aortic valve and coronary arteries. Heart size normal. No pericardial effusion.   Mediastinum/Nodes: No pathologically enlarged mediastinal or axillary lymph nodes. Surgical clips in the left axilla. Hilar regions are difficult to definitively evaluate without IV contrast. Esophagus is grossly unremarkable.   Lungs/Pleura: Bullous centrilobular emphysema. Right upper lobectomy with marked volume loss in the right middle lobe. Mild pleuroparenchymal scarring in the lateral right lower lobe.   Cavitary apical left upper lobe nodule is stable in size, 1.0 x 1.6 cm (5/19). Spiculated lingular nodule has enlarged slightly, now measuring 11 x 13 mm (5/106), previously 5 x 9 mm on 05/07/2021. Additional left upper lobe nodules measure up to 6 mm (5/26), also stable. New focal subpleural consolidation in the left lower lobe (5/125).   No pleural fluid.  Debris is seen in the  airway.   Upper Abdomen: Visualized portions of the liver, adrenal glands, kidneys, spleen and stomach are grossly unremarkable.   Musculoskeletal: Degenerative changes in the spine. Right thoracotomies. No worrisome lytic or sclerotic lesions.   IMPRESSION: 1. Enlarging spiculated lingular nodule, hypermetabolic on 40/98/1191 and highly worrisome for bronchogenic carcinoma. Other apical left upper lobe nodules are stable. Continued attention on follow-up recommended. 2. New subpleural consolidation in the left lower lobe is likely infectious/inflammatory in etiology. Short-term follow-up CT chest without contrast in 3-4 weeks could be performed in further initial evaluation, as clinically indicated, as malignancy cannot be definitively  excluded. 3. Aortic atherosclerosis (ICD10-I70.0). Coronary artery calcification. 4.  Emphysema (ICD10-J43.9).     Electronically Signed   By: Lorin Picket M.D.   On: 10/18/2021 07:50    No results found.    PFT     Latest Ref Rng & Units 08/16/2021   10:49 AM 02/16/2014    9:52 AM  PFT Results  FVC-Pre L 1.91   2.16    FVC-Predicted Pre % 70   72    FVC-Post L 1.98   2.27    FVC-Predicted Post % 72   76    Pre FEV1/FVC % % 32   35    Post FEV1/FCV % % 34   36    FEV1-Pre L 0.61   0.76    FEV1-Predicted Pre % 29   33    FEV1-Post L 0.67   0.82    DLCO uncorrected ml/min/mmHg 8.40   12.06    DLCO UNC% % 45   52    DLCO corrected ml/min/mmHg 8.40     DLCO COR %Predicted % 45     DLVA Predicted % 54   57    TLC L 5.77   6.03    TLC % Predicted % 117   123    RV % Predicted % 161   159         has a past medical history of Acute respiratory failure ([place]), Breast cancer ([place]), Carotid stenosis, right, Celiac disease, Emphysema lung ([place]), History of hiatal hernia, Human metapneumovirus pneumonia (March 2015), Hyperlipidemia, Hypertension, Lung cancer ([place]) (2005), and Stroke ([place]).   reports that she quit smoking about 17 years ago. Her smoking use included cigarettes. She has a 35.00 pack-year smoking history. She has never used smokeless tobacco.  Past Surgical History:  Procedure Laterality Date   BREAST LUMPECTOMY     left    BREAST LUMPECTOMY WITH RADIOACTIVE SEED AND SENTINEL LYMPH NODE BIOPSY Left 07/17/2015   Procedure: LEFT BREAST LUMPECTOMY WITH RADIOACTIVE SEED WITH LEFT AXILLARY SENTINEL LYMPH NODE BIOPSY;  Surgeon: [name], MD;  Location: [facility];  Service: General;  Laterality: Left;   BREAST LUMPECTOMY WITH RADIOACTIVE SEED AND SENTINEL LYMPH NODE BIOPSY Left 05/28/2020   Procedure: LEFT BREAST LUMPECTOMY WITH RADIOACTIVE SEED AND SENTINEL LYMPH NODE BIOPSY;  Surgeon: Jovita Kussmaul, MD;  Location: [facility];   Service: General;  Laterality: Left;   LUNG REMOVAL, PARTIAL Right 2005   Upper lobe   TRIGGER FINGER RELEASE     TUBAL LIGATION  1980    Allergies  Allergen Reactions   Aspirin Other (See Comments)    "burning stomach"   Codeine Nausea Only   Vitamin D Analogs Other (See Comments)    Immunization History  Administered Date(s) Administered   Fluad Quad(high Dose 65+) 03/15/2016   Influenza Split 03/16/2013, 04/11/2013, 03/15/2014   Influenza,  High Dose Seasonal PF 04/22/2015, 03/24/2017, 04/02/2018, 04/05/2019, 04/03/2020, 03/23/2021   Influenza-Unspecified 03/15/2014   Moderna Sars-Covid-2 Vaccination 08/16/2019, 09/14/2019, 08/31/2020   Pneumococcal Polysaccharide-23 02/19/2012    Family History  Problem Relation Age of Onset   Heart disease Father    High blood pressure Father    Congestive Heart Failure Father    Leukemia Mother    Colon polyps Mother    High blood pressure Brother    Lung cancer Brother    Heart disease Brother    Diabetes Brother    Colon polyps Sister    Breast cancer Sister    High blood pressure Sister    Emphysema Sister    Pancreatic cancer Paternal Uncle      Current Outpatient Medications:    albuterol (VENTOLIN HFA) 108 (90 Base) MCG/ACT inhaler, , Disp: , Rfl:    diphenhydrAMINE (BENADRYL) 25 MG tablet, Take 25 mg by mouth every 8 (eight) hours as needed., Disp: , Rfl:    guaiFENesin (MUCINEX) 600 MG 12 hr tablet, Take by mouth 2 (two) times daily., Disp: , Rfl:    ibuprofen (ADVIL,MOTRIN) 200 MG tablet, Take 200 mg by mouth every 6 (six) hours as needed., Disp: , Rfl:    mometasone-formoterol (DULERA) 200-5 MCG/ACT AERO, Inhale 2 puffs into the lungs 2 (two) times daily., Disp: 1 Inhaler, Rfl: 5   Multiple Vitamin (MULTIVITAMIN WITH MINERALS) TABS tablet, Take 1 tablet by mouth daily., Disp: , Rfl:    telmisartan (MICARDIS) 40 MG tablet, Take 1 tablet by mouth daily., Disp: , Rfl:       Objective:   Vitals:   11/18/21 0948   BP: 112/64  Pulse: 87  Temp: 97.8 F (36.6 C)  TempSrc: Oral  SpO2: 95%  Weight: 109 lb 12.8 oz (49.8 kg)  Height: 5' 4"  (1.626 m)    Estimated body mass index is 18.85 kg/m as calculated from the following:   Height as of this encounter: 5' 4"  (1.626 m).   Weight as of this encounter: 109 lb 12.8 oz (49.8 kg).  @WEIGHTCHANGE @  [employer]   11/18/21 0948  Weight: 109 lb 12.8 oz (49.8 kg)     Physical Exam    General: No distress. think Neuro: Alert and Oriented x 3. GCS 15. Speech normal Psych: Pleasant Resp:  Barrel Chest - no.  Wheeze - no, Crackles - no, No overt respiratory distress CVS: Normal heart sounds. Murmurs - no Ext: Stigmata of Connective Tissue Disease - no HEENT: Normal upper airway. PEERL +. No post nasal drip        Assessment:       ICD-10-CM   1. Multiple lung nodules on CT  R91.8     2. History of breast cancer  Z85.3     3. History of lung cancer  Z85.118     4. History of lobectomy of lung  Z90.2     5. Chronic obstructive pulmonary disease, unspecified COPD type ([place])  J44.9          Plan:     Patient Instructions     ICD-10-CM   1. Multiple lung nodules on CT  R91.8     2. History of breast cancer  Z85.3     3. History of lung cancer  Z85.118     4. History of lobectomy of lung  Z90.2     5. Chronic obstructive pulmonary disease, unspecified COPD type ([place])  J44.9      Multiple lung nodules  on CT - May 2023  -spiculated lingula nodule enlarged may 2023 - 34m(prior 975m - LUL apical nodule 1.6cm stable History of breast cancer History of lung cancer History of lobectomy of lung   - overall concerning for lung cancer on left side  Plan  - refer Icard/Byrum  Chronic obstructive pulmonary disease, unspecified COPD type ([place] - stable without flare up  Plan  - mark breztri and spiriva as allergie s- intolerant  -continue dulera scheduled with albuterol as needed  Followup  - see Icard/Byrum asap -  3 months Earley Grobe -routine visit     SIGNATURE    Dr. MuBrand MalesM.D., F.C.C.P,  Pulmonary and Critical Care Medicine Staff Physician, CoObionirector - Interstitial Lung Disease  Program  Pulmonary Fi[id number]: [phone] no answer or between  15:00h - 7:00h: call [phone] Telephone: [phone]  10:29 AM 11/18/2021

## 2021-11-18 ENCOUNTER — Encounter: Payer: Self-pay | Admitting: Internal Medicine

## 2021-11-18 ENCOUNTER — Ambulatory Visit: Payer: Medicare HMO | Admitting: Internal Medicine

## 2021-11-18 VITALS — BP 112/64 | HR 87 | Temp 97.8°F | Ht 64.0 in | Wt 109.8 lb

## 2021-11-18 DIAGNOSIS — Z85118 Personal history of other malignant neoplasm of bronchus and lung: Secondary | ICD-10-CM

## 2021-11-18 DIAGNOSIS — J449 Chronic obstructive pulmonary disease, unspecified: Secondary | ICD-10-CM | POA: Diagnosis not present

## 2021-11-18 DIAGNOSIS — R918 Other nonspecific abnormal finding of lung field: Secondary | ICD-10-CM

## 2021-11-18 DIAGNOSIS — Z853 Personal history of malignant neoplasm of breast: Secondary | ICD-10-CM

## 2021-11-18 DIAGNOSIS — Z902 Acquired absence of lung [part of]: Secondary | ICD-10-CM | POA: Diagnosis not present

## 2021-11-18 NOTE — Patient Instructions (Signed)
ICD-10-CM   1. Multiple lung nodules on CT  R91.8     2. History of breast cancer  Z85.3     3. History of lung cancer  Z85.118     4. History of lobectomy of lung  Z90.2     5. Chronic obstructive pulmonary disease, unspecified COPD type ([place])  J44.9      Multiple lung nodules on CT - May 2023  -spiculated lingula nodule enlarged may 2023 - 86m(prior 939m - LUL apical nodule 1.6cm stable History of breast cancer History of lung cancer History of lobectomy of lung   - overall concerning for lung cancer on left side  Plan  - refer Icard/Byrum  Chronic obstructive pulmonary disease, unspecified COPD type ([place] - stable without flare up  Plan  - mark breztri and spiriva as allergie s- intolerant  -continue dulera scheduled with albuterol as needed  Followup  - see Icard/Byrum asap - 3 months Archimedes Harold -routine visit

## 2021-12-12 ENCOUNTER — Encounter: Payer: Self-pay | Admitting: Hematology and Oncology

## 2021-12-12 ENCOUNTER — Inpatient Hospital Stay (HOSPITAL_BASED_OUTPATIENT_CLINIC_OR_DEPARTMENT_OTHER): Payer: Medicare HMO | Admitting: Hematology and Oncology

## 2021-12-12 ENCOUNTER — Other Ambulatory Visit: Payer: Self-pay

## 2021-12-12 ENCOUNTER — Inpatient Hospital Stay: Payer: Medicare HMO | Attending: Hematology and Oncology

## 2021-12-12 VITALS — BP 152/70 | HR 86 | Temp 97.9°F | Resp 17 | Wt 109.5 lb

## 2021-12-12 DIAGNOSIS — C50512 Malignant neoplasm of lower-outer quadrant of left female breast: Secondary | ICD-10-CM

## 2021-12-12 DIAGNOSIS — Z7951 Long term (current) use of inhaled steroids: Secondary | ICD-10-CM | POA: Insufficient documentation

## 2021-12-12 DIAGNOSIS — Z9221 Personal history of antineoplastic chemotherapy: Secondary | ICD-10-CM | POA: Insufficient documentation

## 2021-12-12 DIAGNOSIS — Z79811 Long term (current) use of aromatase inhibitors: Secondary | ICD-10-CM | POA: Insufficient documentation

## 2021-12-12 DIAGNOSIS — Z79899 Other long term (current) drug therapy: Secondary | ICD-10-CM | POA: Diagnosis not present

## 2021-12-12 DIAGNOSIS — R918 Other nonspecific abnormal finding of lung field: Secondary | ICD-10-CM | POA: Diagnosis not present

## 2021-12-12 DIAGNOSIS — Z170 Estrogen receptor positive status [ER+]: Secondary | ICD-10-CM | POA: Diagnosis not present

## 2021-12-12 DIAGNOSIS — Z923 Personal history of irradiation: Secondary | ICD-10-CM | POA: Insufficient documentation

## 2021-12-12 DIAGNOSIS — C50912 Malignant neoplasm of unspecified site of left female breast: Secondary | ICD-10-CM | POA: Diagnosis not present

## 2021-12-12 DIAGNOSIS — C50412 Malignant neoplasm of upper-outer quadrant of left female breast: Secondary | ICD-10-CM | POA: Insufficient documentation

## 2021-12-12 DIAGNOSIS — Z87891 Personal history of nicotine dependence: Secondary | ICD-10-CM | POA: Insufficient documentation

## 2021-12-12 LAB — CMP (CANCER CENTER ONLY)
ALT: 20 U/L (ref 0–44)
AST: 23 U/L (ref 15–41)
Albumin: 4.3 g/dL (ref 3.5–5.0)
Alkaline Phosphatase: 61 U/L (ref 38–126)
Anion gap: 7 (ref 5–15)
BUN: 10 mg/dL (ref 8–23)
CO2: 27 mmol/L (ref 22–32)
Calcium: 9.4 mg/dL (ref 8.9–10.3)
Chloride: 105 mmol/L (ref 98–111)
Creatinine: 0.81 mg/dL (ref 0.44–1.00)
GFR, Estimated: >60 mL/min (ref >60)
Glucose, Bld: 112 mg/dL — ABNORMAL HIGH (ref 70–99)
Potassium: 4.2 mmol/L (ref 3.5–5.1)
Sodium: 139 mmol/L (ref 135–145)
Total Bilirubin: 0.3 mg/dL (ref 0.3–1.2)
Total Protein: 6.9 g/dL (ref 6.5–8.1)

## 2021-12-12 LAB — CBC WITH DIFFERENTIAL (CANCER CENTER ONLY)
Abs Immature Granulocytes: 0.02 10*3/uL (ref 0.00–0.07)
Basophils Absolute: 0 10*3/uL (ref 0.0–0.1)
Basophils Relative: 1 %
Eosinophils Absolute: 0.2 10*3/uL (ref 0.0–0.5)
Eosinophils Relative: 2 %
HCT: 37.8 % (ref 36.0–46.0)
Hemoglobin: 12.9 g/dL (ref 12.0–15.0)
Immature Granulocytes: 0 %
Lymphocytes Relative: 22 %
Lymphs Abs: 1.8 10*3/uL (ref 0.7–4.0)
MCH: 29.6 pg (ref 26.0–34.0)
MCHC: 34.1 g/dL (ref 30.0–36.0)
MCV: 86.7 fL (ref 80.0–100.0)
Monocytes Absolute: 0.7 10*3/uL (ref 0.1–1.0)
Monocytes Relative: 8 %
Neutro Abs: 5.5 10*3/uL (ref 1.7–7.7)
Neutrophils Relative %: 67 %
Platelet Count: 264 10*3/uL (ref 150–400)
RBC: 4.36 MIL/uL (ref 3.87–5.11)
RDW: 13.1 % (ref 11.5–15.5)
WBC Count: 8.1 10*3/uL (ref 4.0–10.5)
nRBC: 0 % (ref 0.0–0.2)

## 2021-12-12 NOTE — Progress Notes (Signed)
[place]  Telephone:([phone] Fax:([phone]) [phone]     ID: [name] DOB: 1945-11-25  MR#: [id number]  JYN#:[id number]  Patient Care Team: Jessica Shanks, MD as PCP - General (Family Medicine) Gilbert, Jessica Dad, MD (Inactive) as Consulting Physician (Oncology) Jessica Gibson, MD as Attending Physician (Radiation Oncology) Jessica Kussmaul, MD as Consulting Physician (General Surgery) Jessica Kaufmann, RN as Oncology Nurse Navigator Jessica Germany, RN as Oncology Nurse Navigator OTHER MD:  I connected with Jessica Gilbert on 12/12/21 at  3:30 PM EDT by telephone visit and verified that I am speaking with the correct person using two identifiers.   I discussed the limitations, risks, security and privacy concerns of performing an evaluation and management service by telemedicine and the availability of in-person appointments. I also discussed with the patient that there may be a patient responsible charge related to this service. The patient expressed understanding and agreed to proceed.   Other persons participating in the visit and their role in the encounter: None  Patient's location: Home Provider's location: [facility]   I provided 20 minutes of non face-to-face telephone visit time during this encounter, and > 50% was spent counseling as documented under my assessment & plan.   CHIEF COMPLAINT:  Estrogen receptor positive breast cancer  CURRENT TREATMENT: Observation   INTERVAL HISTORY: Ziona was contacted today for follow-up of her estrogen receptor positive breast cancer. She continues under observation.    Since her last visit, she underwent restaging chest CT on 05/07/2021 showing: multiple new nodular areas of architectural distortion in left upper lobe near apex, nonspecific and favored infectious or inflammatory; imaging findings suggestive of underlying COPD. Short-term follow up (3-4 weeks) with repeat chest CT without contrast  was recommended.  She also underwent bilateral diagnostic mammography with tomography at [facility] on 05/08/2021 showing: breast density category C; there were new postsurgical changes in the upper outer left breast consistent with a lumpectomy but also developing density in the left retroareolar breast.  This also might be related to interval surgery but ultrasound of the left breast was performed showing a scar at 3:00 extending from the areola posteriorly.  This was all consistent with postsurgical change.   REVIEW OF SYSTEMS: Jessica Gilbert tells me she has had 2 rounds of antibiotics for "severe sinus and mucus problems.  There was some improvement but no resolution.  She generally feels well otherwise, and is walking for exercise.  A detailed review of systems was otherwise stable.   COVID 19 VACCINATION STATUS: Status post Moderna x4,   BREAST CANCER HISTORY: From the original intake note:   Jessica Gilbert (pronounced "ro-lund") had bilateral screening mammography at the breast Center 05/26/2014 showing a possible mass in the left breast. Left digital mammography with ultrasonography 06/14/2014 found a breast density to be category C. There was no persistent mass or distortion or malignant-type microcalcifications. . There was no palpable mass. Ultrasound showed a well circumscribed benign appearing nodule in the left breast 2:00 position measuring 4 mm. This was felt to be most likely benign but repeat ultrasound  At 6 months was recommended and thiswas performed 12/14/2014.  The mass was unchanged an incidental note was made of a benign appearing 0.5 cm lymph node in the left breast 2:00 position.   On 06/15/2015 Jessica Gilbert had bilateral diagnostic mammography with tomosynthesis and this time an irregular mass with spiculated margins was noted in the outer lower left breast measuring approximately 9 mm. Physical  examination confirmed a firm fixed nodule at the 3:30 o'clock position an ultrasound of this area  confirmed an irregular hypoechoic mass measuring 0.7 cm. There was a 0.2 cm adjacent satellite nodule. There was no left axillary lymphadenopathy.   biopsy of this mass was obtained 06/15/2015, and showed ([id number]) an invasive ductal carcinoma, grade 1, estrogen receptor 100% positive, progesterone receptor 40% positive, both with strong staining intensity, with an MIB-1 of 11%, and no HER-2 amplification, the signals ratio being 1.39 and the number per cell 2.15.   Jessica Gilbert also has a history of prior tobacco abuse, status post right upper lobectomy for lung cancer, with significant emphysema.   The patient's subsequent history is as detailed below   PAST MEDICAL HISTORY: Past Medical History:  Diagnosis Date   Acute respiratory failure ([place])    Breast cancer ([place])    Carotid stenosis, right    Celiac disease    Emphysema lung ([place])    History of hiatal hernia    Human metapneumovirus pneumonia March 2015   Hyperlipidemia    Hypertension    Lung cancer ([place]) 2005   Rt upper lobe   Stroke ([place])    TIA w/o defecits    PAST SURGICAL HISTORY: Past Surgical History:  Procedure Laterality Date   BREAST LUMPECTOMY     left    BREAST LUMPECTOMY WITH RADIOACTIVE SEED AND SENTINEL LYMPH NODE BIOPSY Left 07/17/2015   Procedure: LEFT BREAST LUMPECTOMY WITH RADIOACTIVE SEED WITH LEFT AXILLARY SENTINEL LYMPH NODE BIOPSY;  Surgeon: [name], MD;  Location: [place];  Service: General;  Laterality: Left;   BREAST LUMPECTOMY WITH RADIOACTIVE SEED AND SENTINEL LYMPH NODE BIOPSY Left 05/28/2020   Procedure: LEFT BREAST LUMPECTOMY WITH RADIOACTIVE SEED AND SENTINEL LYMPH NODE BIOPSY;  Surgeon: Jessica Kussmaul, MD;  Location: [place];  Service: General;  Laterality: Left;   LUNG REMOVAL, PARTIAL Right 2005   Upper lobe   TRIGGER FINGER RELEASE     TUBAL LIGATION  1980    FAMILY HISTORY Family History  Problem Relation Age of Onset   Heart disease Father     High blood pressure Father    Congestive Heart Failure Father    Leukemia Mother    Colon polyps Mother    High blood pressure Brother    Lung cancer Brother    Heart disease Brother    Diabetes Brother    Colon polyps Sister    Breast cancer Sister    High blood pressure Sister    Emphysema Sister    Pancreatic cancer Paternal Uncle    the patient's father died at the age of 33 with congestive 40 failure. Patient's mother died with acute leukemia at the age of 34. The patient had one brother, 2 sisters. One sister had breast cancer at age 43. One brother, smoker, was diagnosed with lung cancer at the age of 64. There is a paternal uncle who may have had pancreatic cancer. There is no history of ovarian cancer in the family.   GYNECOLOGIC HISTORY:  No LMP recorded. Patient is postmenopausal.  menarche age 21, first live birth age 70. The patient is GX P2. She stopped having periods in 1998, took hormone replacement approximately 6 months. Remotely she took oral contraceptives about 3 years with no complications   SOCIAL HISTORY: (updated November 2022)  Jessica Gilbert worked as an Optometrist.  She describes herself is single. At home she lives by herself, with 3 cats. Her son  Jessica Gilbert lives in [place] and was recently promoted to Tax adviser in Event organiser. The patient's other son died 29 years ago. Ladeana has 2 grandchildren, a Geographical information systems officer who graduated from [employer] and now is working towards a PhD and a grandson who works for [employer] in [place].. She is a Tourist information centre manager.    ADVANCED DIRECTIVES:  In place. Her son Jessica Gilbert is her healthcare power of attorney. He can be reached at [place]: Social History   Tobacco Use   Smoking status: Former    Packs/day: 1.00    Years: 35.00    Total pack years: 35.00    Types: Cigarettes    Quit date: 01/29/2004    Years since quitting: 17.8   Smokeless tobacco: Never  Vaping Use   Vaping Use: Never used   Substance Use Topics   Alcohol use: Yes    Alcohol/week: 0.0 standard drinks of alcohol    Comment: wine occasionally   Drug use: No     Colonoscopy:  PAP:  Bone density: remote.   Lipid panel:  Allergies  Allergen Reactions   Aspirin Other (See Comments)    "burning stomach"   Codeine Nausea Only   Vitamin D Analogs Other (See Comments)    Current Outpatient Medications  Medication Sig Dispense Refill   albuterol (VENTOLIN HFA) 108 (90 Base) MCG/ACT inhaler      diphenhydrAMINE (BENADRYL) 25 MG tablet Take 25 mg by mouth every 8 (eight) hours as needed.     guaiFENesin (MUCINEX) 600 MG 12 hr tablet Take by mouth 2 (two) times daily.     ibuprofen (ADVIL,MOTRIN) 200 MG tablet Take 200 mg by mouth every 6 (six) hours as needed.     mometasone-formoterol (DULERA) 200-5 MCG/ACT AERO Inhale 2 puffs into the lungs 2 (two) times daily. 1 Inhaler 5   Multiple Vitamin (MULTIVITAMIN WITH MINERALS) TABS tablet Take 1 tablet by mouth daily.     telmisartan (MICARDIS) 40 MG tablet Take 1 tablet by mouth daily.     No current facility-administered medications for this visit.    OBJECTIVE:   Vitals:   12/12/21 1520  BP: (!) 152/70  Pulse: 86  Resp: 17  Temp: 97.9 F (36.6 C)  SpO2: 93%       Body mass index is 18.8 kg/m.    ECOG FS: Filed Weights   12/12/21 1520  Weight: 109 lb 8 oz (49.7 kg)     Telemedicine visit 05/22/2021     LAB RESULTS:  CMP     Component Value Date/Time   NA 140 04/25/2021 1248   NA 140 10/08/2016 1152   K 4.3 04/25/2021 1248   K 4.3 10/08/2016 1152   CL 104 04/25/2021 1248   CO2 26 04/25/2021 1248   CO2 25 10/08/2016 1152   GLUCOSE 118 (H) 04/25/2021 1248   GLUCOSE 98 10/08/2016 1152   BUN 11 04/25/2021 1248   BUN 12.4 10/08/2016 1152   CREATININE 0.88 04/25/2021 1248   CREATININE 0.8 10/08/2016 1152   CALCIUM 10.0 04/25/2021 1248   CALCIUM 9.7 10/08/2016 1152   PROT 7.5 04/25/2021 1248   PROT 7.1 10/08/2016 1152   ALBUMIN 4.3  04/25/2021 1248   ALBUMIN 4.1 10/08/2016 1152   AST 27 04/25/2021 1248   AST 23 10/08/2016 1152   ALT 24 04/25/2021 1248   ALT 18 10/08/2016 1152   ALKPHOS 70 04/25/2021 1248   ALKPHOS 75 10/08/2016 1152   BILITOT 0.5 04/25/2021 1248  BILITOT 0.42 10/08/2016 1152   GFRNONAA >60 04/25/2021 1248   GFRAA >60 01/09/2020 1503    INo results found for: "SPEP", "UPEP"  Lab Results  Component Value Date   WBC 8.1 12/12/2021   NEUTROABS 5.5 12/12/2021   HGB 12.9 12/12/2021   HCT 37.8 12/12/2021   MCV 86.7 12/12/2021   PLT 264 12/12/2021      Chemistry      Component Value Date/Time   NA 140 04/25/2021 1248   NA 140 10/08/2016 1152   K 4.3 04/25/2021 1248   K 4.3 10/08/2016 1152   CL 104 04/25/2021 1248   CO2 26 04/25/2021 1248   CO2 25 10/08/2016 1152   BUN 11 04/25/2021 1248   BUN 12.4 10/08/2016 1152   CREATININE 0.88 04/25/2021 1248   CREATININE 0.8 10/08/2016 1152      Component Value Date/Time   CALCIUM 10.0 04/25/2021 1248   CALCIUM 9.7 10/08/2016 1152   ALKPHOS 70 04/25/2021 1248   ALKPHOS 75 10/08/2016 1152   AST 27 04/25/2021 1248   AST 23 10/08/2016 1152   ALT 24 04/25/2021 1248   ALT 18 10/08/2016 1152   BILITOT 0.5 04/25/2021 1248   BILITOT 0.42 10/08/2016 1152       No results found for: "LABCA2"  No components found for: "LABCA125"  No results for input(s): "INR" in the last 168 hours.  Urinalysis No results found for: "COLORURINE", "APPEARANCEUR", "LABSPEC", "PHURINE", "GLUCOSEU", "HGBUR", "BILIRUBINUR", "KETONESUR", "PROTEINUR", "UROBILINOGEN", "NITRITE", "LEUKOCYTESUR"   STUDIES: No results found.   ASSESSMENT: 76 y.o.  [place] woman status post left breast lower outer quadrant biopsy 06/15/2015 for a clinical mT1c N0, stage IA  Invasive ductal carcinoma, grade 1, estrogen and progesterone receptor positive, HER-2 not amplified, with an MIB-1 of 11%  (1) status post left lumpectomy with sentinel lymph node sampling 07/17/2015 for a  pT1b pN0, stage IA invasive ductal carcinoma, grade 2, with negative though close margins; repeat HER-2 was again negative, and estrogen and progesterone receptors were again positive  (2) Oncotype DX score of 16 predicts an outside the breast risk of recurrence within 10 years of 10% if the patient's only systemic therapy is tamoxifen for 5 years. It also her next no benefit from chemotherapy  (3)  The patient opted against adjuvant radiation  (4)  Anastrozole started 09/07/2015 discontinued May 2017 with intolerance  (a) exemestane started June 2017, discontinued August 2017 with intolerance.  (b) bone density at [facility] 11/05/2015 shows a T score of -2.0   (5) Factor V Leiden positive: elevated clotting risk with any procedure, immobilization, or procoagulant drugs such as estrogen  (6) s/p right upper lobectomy (after neoadjuvant chemo-radiation) August  2005 for a primary lung cancer: Non-small cell carcinoma, with a 4 cm area of tumor regression and necrosis and rare atypical cells but no obvious viable tumor remaining, and a total of 9 regional lymph nodes (N1, and 2) all negative.  (7) status post left breast upper outer quadrant 05/01/2020 shows invasive ductal carcinoma, low-grade, estrogen and progesterone receptor positive, HER-2 not amplified, with an MIB-1 of 25%  (8) status post left lumpectomy and sentinel lymph node sampling 05/28/2020 for a pT1b pN0, stage IA invasive ductal carcinoma, grade 2, with negative margins.  (a) a single left axillary lymph node removed  (b) status post chemoradiation for right upper lobectomy, making adjuvant radiation for her breast cancer problematic  (9) fulvestrant was to start 07/13/2019, but patient opted against  (a) patient is  intolerant of aromatase inhibitors and tamoxifen is contraindicated (#5)   PLAN:   Eyvette is now 76 years out from definitive surgery for her stage I lung cancer and a little over a year out from her  second left lumpectomy, with no evidence of active disease.  She has refused adjuvant antiestrogens and is being followed with observation alone.      *Total Encounter Time as defined by the Centers for Medicare and Medicaid Services includes, in addition to the face-to-face time of a patient visit (documented in the note above) non-face-to-face time: obtaining and reviewing outside history, ordering and reviewing medications, tests or procedures, care coordination (communications with other health care professionals or caregivers) and documentation in the medical record.

## 2021-12-12 NOTE — Progress Notes (Signed)
[name]  [phone] [phone]     ID: Jessica Gilbert DOB: October 08, 1945  MR#: [id number]  WHQ#:[id number]  Patient Care Team: Vernie Shanks, MD as PCP - General (Family Medicine) Magrinat, Virgie Dad, MD (Inactive) as Consulting Physician (Oncology) [name], MD as Attending Physician (Radiation Oncology) Jovita Kussmaul, MD as Consulting Physician (General Surgery) Mauro Kaufmann, RN as Oncology Nurse Navigator Rockwell Germany, RN as Oncology Nurse Navigator OTHER MD:   CHIEF COMPLAINT:  Estrogen receptor positive breast cancer  CURRENT TREATMENT: Observation   INTERVAL HISTORY:  Jessica Gilbert is here for follow-up.  Since last visit, she had series of imaging for her lung nodules, has a follow-up with Dr. Lamonte Sakai for lung nodule biopsy.  She is understandably apprehensive about the possibility of this being bronchogenic carcinoma.  She did have history of lung cancer many years ago and received chemoradiation as well as surgery for this.  She otherwise is due for mammogram around October 2023, she is here to schedule this.  She could not tolerate adjuvant antiestrogen therapy well.  She denies any breast changes.  Rest of the pertinent 10 point ROS reviewed and negative   COVID 19 VACCINATION STATUS: Status post Moderna x4,   BREAST CANCER HISTORY: From the original intake note:   Jessica Gilbert (pronounced "ro-lund") had bilateral screening mammography at the [facility] Center 05/26/2014 showing a possible mass in the left breast. Left digital mammography with ultrasonography 06/14/2014 found a breast density to be category C. There was no persistent mass or distortion or malignant-type microcalcifications. . There was no palpable mass. Ultrasound showed a well circumscribed benign appearing nodule in the left breast 2:00 position measuring 4 mm. This was felt to be most likely benign but repeat ultrasound  At 6 months was recommended and thiswas  performed 12/14/2014.  The mass was unchanged an incidental note was made of a benign appearing 0.5 cm lymph node in the left breast 2:00 position.   On 06/15/2015 Jessica Gilbert had bilateral diagnostic mammography with tomosynthesis and this time an irregular mass with spiculated margins was noted in the outer lower left breast measuring approximately 9 mm. Physical examination confirmed a firm fixed nodule at the 3:30 o'clock position an ultrasound of this area confirmed an irregular hypoechoic mass measuring 0.7 cm. There was a 0.2 cm adjacent satellite nodule. There was no left axillary lymphadenopathy.   biopsy of this mass was obtained 06/15/2015, and showed ([id number]) an invasive ductal carcinoma, grade 1, estrogen receptor 100% positive, progesterone receptor 40% positive, both with strong staining intensity, with an MIB-1 of 11%, and no HER-2 amplification, the signals ratio being 1.39 and the number per cell 2.15.   [name] also has a history of prior tobacco abuse, status post right upper lobectomy for lung cancer, with significant emphysema.   The patient's subsequent history is as detailed below   PAST MEDICAL HISTORY: Past Medical History:  Diagnosis Date   Acute respiratory failure ([place])    Breast cancer ([place])    Carotid stenosis, right    Celiac disease    Emphysema lung ([place])    History of hiatal hernia    Human metapneumovirus pneumonia March 2015   Hyperlipidemia    Hypertension    Lung cancer ([place]) 2005   Rt upper lobe   Stroke ([place])    TIA w/o defecits    PAST SURGICAL HISTORY: Past Surgical History:  Procedure Laterality Date   BREAST LUMPECTOMY  left    BREAST LUMPECTOMY WITH RADIOACTIVE SEED AND SENTINEL LYMPH NODE BIOPSY Left 07/17/2015   Procedure: LEFT BREAST LUMPECTOMY WITH RADIOACTIVE SEED WITH LEFT AXILLARY SENTINEL LYMPH NODE BIOPSY;  Surgeon: [name], MD;  Location: [facility];  Service: General;  Laterality: Left;   BREAST  LUMPECTOMY WITH RADIOACTIVE SEED AND SENTINEL LYMPH NODE BIOPSY Left 05/28/2020   Procedure: LEFT BREAST LUMPECTOMY WITH RADIOACTIVE SEED AND SENTINEL LYMPH NODE BIOPSY;  Surgeon: Jovita Kussmaul, MD;  Location: [facility];  Service: General;  Laterality: Left;   LUNG REMOVAL, PARTIAL Right 2005   Upper lobe   TRIGGER FINGER RELEASE     TUBAL LIGATION  1980    FAMILY HISTORY Family History  Problem Relation Age of Onset   Heart disease Father    High blood pressure Father    Congestive Heart Failure Father    Leukemia Mother    Colon polyps Mother    High blood pressure Brother    Lung cancer Brother    Heart disease Brother    Diabetes Brother    Colon polyps Sister    Breast cancer Sister    High blood pressure Sister    Emphysema Sister    Pancreatic cancer Paternal Uncle    the patient's father died at the age of 43 with congestive 40 failure. Patient's mother died with acute leukemia at the age of 39. The patient had one brother, 2 sisters. One sister had breast cancer at age 68. One brother, smoker, was diagnosed with lung cancer at the age of 67. There is a paternal uncle who may have had pancreatic cancer. There is no history of ovarian cancer in the family.   GYNECOLOGIC HISTORY:  No LMP recorded. Patient is postmenopausal.  menarche age 75, first live birth age 74. The patient is GX P2. She stopped having periods in 1998, took hormone replacement approximately 6 months. Remotely she took oral contraceptives about 3 years with no complications   SOCIAL HISTORY: (updated November 2022)  Jessica Gilbert worked as an Optometrist.  She describes herself is single. At home she lives by herself, with 3 cats. Her son Jessica Gilbert lives in [place] and was recently promoted to Tax adviser in Event organiser. The patient's other son died 60 years ago. Mahogany has 2 grandchildren, a Geographical information systems officer who graduated from [employer] and now is working towards a PhD and a grandson who  works for [employer] in [place].. She is a Tourist information centre manager.    ADVANCED DIRECTIVES:  In place. Her son Jessica Gilbert is her healthcare power of attorney. He can be reached at [place]: Social History   Tobacco Use   Smoking status: Former    Packs/day: 1.00    Years: 35.00    Total pack years: 35.00    Types: Cigarettes    Quit date: 01/29/2004    Years since quitting: 17.8   Smokeless tobacco: Never  Vaping Use   Vaping Use: Never used  Substance Use Topics   Alcohol use: Yes    Alcohol/week: 0.0 standard drinks of alcohol    Comment: wine occasionally   Drug use: No     Colonoscopy:  PAP:  Bone density: remote.   Lipid panel:  Allergies  Allergen Reactions   Aspirin Other (See Comments)    "burning stomach"   Codeine Nausea Only   Vitamin D Analogs Other (See Comments)    Current Outpatient Medications  Medication Sig Dispense Refill  albuterol (VENTOLIN HFA) 108 (90 Base) MCG/ACT inhaler      diphenhydrAMINE (BENADRYL) 25 MG tablet Take 25 mg by mouth every 8 (eight) hours as needed.     guaiFENesin (MUCINEX) 600 MG 12 hr tablet Take by mouth 2 (two) times daily.     ibuprofen (ADVIL,MOTRIN) 200 MG tablet Take 200 mg by mouth every 6 (six) hours as needed.     mometasone-formoterol (DULERA) 200-5 MCG/ACT AERO Inhale 2 puffs into the lungs 2 (two) times daily. 1 Inhaler 5   Multiple Vitamin (MULTIVITAMIN WITH MINERALS) TABS tablet Take 1 tablet by mouth daily.     telmisartan (MICARDIS) 40 MG tablet Take 1 tablet by mouth daily.     No current facility-administered medications for this visit.    OBJECTIVE:   Vitals:   12/12/21 1520  BP: (!) 152/70  Pulse: 86  Resp: 17  Temp: 97.9 F (36.6 C)  SpO2: 93%       Body mass index is 18.8 kg/m.    ECOG FS: Filed Weights   12/12/21 1520  Weight: 109 lb 8 oz (49.7 kg)    Physical Exam Constitutional:      Appearance: Normal appearance.  Cardiovascular:     Rate and Rhythm: Normal rate and  regular rhythm.  Pulmonary:     Comments: Very poor inspiratory effort.  No adventitious sounds Chest:     Comments: Bilateral breasts inspected.  No palpable masses or regional adenopathy.  No concern for breast cancer recurrence Musculoskeletal:     Cervical back: Normal range of motion and neck supple. No rigidity.  Lymphadenopathy:     Cervical: No cervical adenopathy.  Neurological:     Mental Status: She is alert.      LAB RESULTS:  CMP     Component Value Date/Time   NA 140 04/25/2021 1248   NA 140 10/08/2016 1152   K 4.3 04/25/2021 1248   K 4.3 10/08/2016 1152   CL 104 04/25/2021 1248   CO2 26 04/25/2021 1248   CO2 25 10/08/2016 1152   GLUCOSE 118 (H) 04/25/2021 1248   GLUCOSE 98 10/08/2016 1152   BUN 11 04/25/2021 1248   BUN 12.4 10/08/2016 1152   CREATININE 0.88 04/25/2021 1248   CREATININE 0.8 10/08/2016 1152   CALCIUM 10.0 04/25/2021 1248   CALCIUM 9.7 10/08/2016 1152   PROT 7.5 04/25/2021 1248   PROT 7.1 10/08/2016 1152   ALBUMIN 4.3 04/25/2021 1248   ALBUMIN 4.1 10/08/2016 1152   AST 27 04/25/2021 1248   AST 23 10/08/2016 1152   ALT 24 04/25/2021 1248   ALT 18 10/08/2016 1152   ALKPHOS 70 04/25/2021 1248   ALKPHOS 75 10/08/2016 1152   BILITOT 0.5 04/25/2021 1248   BILITOT 0.42 10/08/2016 1152   GFRNONAA >60 04/25/2021 1248   GFRAA >60 01/09/2020 1503    INo results found for: "SPEP", "UPEP"  Lab Results  Component Value Date   WBC 8.1 12/12/2021   NEUTROABS 5.5 12/12/2021   HGB 12.9 12/12/2021   HCT 37.8 12/12/2021   MCV 86.7 12/12/2021   PLT 264 12/12/2021      Chemistry      Component Value Date/Time   NA 140 04/25/2021 1248   NA 140 10/08/2016 1152   K 4.3 04/25/2021 1248   K 4.3 10/08/2016 1152   CL 104 04/25/2021 1248   CO2 26 04/25/2021 1248   CO2 25 10/08/2016 1152   BUN 11 04/25/2021 1248   BUN 12.4 10/08/2016 1152  CREATININE 0.88 04/25/2021 1248   CREATININE 0.8 10/08/2016 1152      Component Value Date/Time    CALCIUM 10.0 04/25/2021 1248   CALCIUM 9.7 10/08/2016 1152   ALKPHOS 70 04/25/2021 1248   ALKPHOS 75 10/08/2016 1152   AST 27 04/25/2021 1248   AST 23 10/08/2016 1152   ALT 24 04/25/2021 1248   ALT 18 10/08/2016 1152   BILITOT 0.5 04/25/2021 1248   BILITOT 0.42 10/08/2016 1152       No results found for: "LABCA2"  No components found for: "LABCA125"  No results for input(s): "INR" in the last 168 hours.  Urinalysis No results found for: "COLORURINE", "APPEARANCEUR", "LABSPEC", "PHURINE", "GLUCOSEU", "HGBUR", "BILIRUBINUR", "KETONESUR", "PROTEINUR", "UROBILINOGEN", "NITRITE", "LEUKOCYTESUR"   STUDIES: No results found.   ASSESSMENT: 76 y.o.  [place] woman status post left breast lower outer quadrant biopsy 06/15/2015 for a clinical mT1c N0, stage IA  Invasive ductal carcinoma, grade 1, estrogen and progesterone receptor positive, HER-2 not amplified, with an MIB-1 of 11%  (1) status post left lumpectomy with sentinel lymph node sampling 07/17/2015 for a pT1b pN0, stage IA invasive ductal carcinoma, grade 2, with negative though close margins; repeat HER-2 was again negative, and estrogen and progesterone receptors were again positive  (2) Oncotype DX score of 16 predicts an outside the breast risk of recurrence within 10 years of 10% if the patient's only systemic therapy is tamoxifen for 5 years. It also her next no benefit from chemotherapy  (3)  The patient opted against adjuvant radiation  (4)  Anastrozole started 09/07/2015 discontinued May 2017 with intolerance  (a) exemestane started June 2017, discontinued August 2017 with intolerance.  (b) bone density at [facility] 11/05/2015 shows a T score of -2.0   (5) Factor V Leiden positive: elevated clotting risk with any procedure, immobilization, or procoagulant drugs such as estrogen  (6) s/p right upper lobectomy (after neoadjuvant chemo-radiation) August  2005 for a primary lung cancer: Non-small cell  carcinoma, with a 4 cm area of tumor regression and necrosis and rare atypical cells but no obvious viable tumor remaining, and a total of 9 regional lymph nodes (N1, and 2) all negative.  (7) status post left breast upper outer quadrant 05/01/2020 shows invasive ductal carcinoma, low-grade, estrogen and progesterone receptor positive, HER-2 not amplified, with an MIB-1 of 25%  (8) status post left lumpectomy and sentinel lymph node sampling 05/28/2020 for a pT1b pN0, stage IA invasive ductal carcinoma, grade 2, with negative margins.  (a) a single left axillary lymph node removed  (b) status post chemoradiation for right upper lobectomy, making adjuvant radiation for her breast cancer problematic  (9) fulvestrant was to start 07/13/2019, but patient opted against  (a) patient is intolerant of aromatase inhibitors and tamoxifen is contraindicated (#5)   PLAN:  Anasophia is now 71 years out from definitive surgery for her stage I lung cancer and a little over a year out from her second left lumpectomy, with no evidence of active disease.  She has refused adjuvant antiestrogens and is being followed with observation alone.  She has tried aromatase inhibitors but could not tolerate them well.  She opted against Faslodex and tamoxifen was not given given her history of factor V Leiden mutation.  Today there appears to be no evidence of breast cancer recurrence.  She was clearly instructed to schedule her next mammogram at [facility].  She expressed understanding.  From a her lung cancer standpoint, she appears to have new lung nodule  which will be biopsied by Dr. Lamonte Sakai in July.  She was instructed to contact us if there is evidence of lung cancer.  She will follow-up with [place] in 1 year otherwise.  She was encouraged to contact us sooner with any new questions or concerns Total time spent: 30 minutes  *Total Encounter Time as defined by the Centers for Medicare and Medicaid Services includes, in addition to the  face-to-face time of a patient visit (documented in the note above) non-face-to-face time: obtaining and reviewing outside history, ordering and reviewing medications, tests or procedures, care coordination (communications with other health care professionals or caregivers) and documentation in the medical record.

## 2021-12-16 ENCOUNTER — Other Ambulatory Visit: Payer: Self-pay | Admitting: *Deleted

## 2021-12-26 ENCOUNTER — Ambulatory Visit: Payer: Medicare HMO | Admitting: Emergency Medicine

## 2022-01-23 ENCOUNTER — Encounter: Payer: Self-pay | Admitting: Emergency Medicine

## 2022-01-23 ENCOUNTER — Ambulatory Visit: Payer: Medicare HMO | Admitting: Emergency Medicine

## 2022-01-23 VITALS — BP 128/76 | HR 89 | Temp 98.3°F | Ht 64.0 in | Wt 107.8 lb

## 2022-01-23 DIAGNOSIS — J301 Allergic rhinitis due to pollen: Secondary | ICD-10-CM

## 2022-01-23 DIAGNOSIS — R918 Other nonspecific abnormal finding of lung field: Secondary | ICD-10-CM | POA: Diagnosis not present

## 2022-01-23 DIAGNOSIS — J438 Other emphysema: Secondary | ICD-10-CM

## 2022-01-23 DIAGNOSIS — J309 Allergic rhinitis, unspecified: Secondary | ICD-10-CM | POA: Insufficient documentation

## 2022-01-23 NOTE — Patient Instructions (Addendum)
We reviewed your CT scan of the chest today.  We will arrange for repeat CT chest We will arrange for bronchoscopy to evaluate your pulmonary nodules.  This would be done under general anesthesia as an outpatient at [facility] endoscopy.  You will need a designated driver.  We will try to get this arranged for 02/10/2022. Continue your [name] as you have been taking it Continue your fluticasone nasal spray as you have been taking it Follow with Dr Lamonte Sakai in 1 month or next available

## 2022-01-23 NOTE — Assessment & Plan Note (Signed)
Continue your [name] as you have been taking it

## 2022-01-23 NOTE — Assessment & Plan Note (Signed)
Multiple enlarging left-sided pulmonary nodules.  Most recent CT 10/17/2021.  Plan for repeat super D CT now.  Based on the enlargement and her history of breast cancer, lung cancer, I believe she needs navigational bronchoscopy.  Talked about this with her today.  She understands the risks, benefits, rationale and agrees to proceed.  I will try to get this arranged for 02/10/2022  We reviewed your CT scan of the chest today.  We will arrange for repeat CT chest We will arrange for bronchoscopy to evaluate your pulmonary nodules.  This would be done under general anesthesia as an outpatient at [facility] endoscopy.  You will need a designated driver.  We will try to get this arranged for 02/10/2022. Follow with Dr Lamonte Sakai in 1 month or next available

## 2022-01-23 NOTE — Progress Notes (Signed)
Subjective:    Patient ID: Jessica Gilbert, female    DOB: 16-Mar-1946, 76 y.o.   MRN: [id number]  HPI 76 year old former smoker (35 pack years) with a history of breast cancer, celiac disease, GERD with a hiatal hernia, hypertension with hyperlipidemia, TIA.  She had a right upper lobe lobectomy in 2005 for non-small cell lung cancer and has been in remission.  She is referred today for slowly enlarging pulmonary nodules that have been followed by PET scan and CT scan of the chest most recently 10/17/2021.  PET scan 07/22/2021 to evaluate left lung nodule showed no evidence of hypermetabolic lymphadenopathy there was mild hypermetabolism and a 13 mm posterior left upper lobe pulmonary nodule.  Other smaller left apical nodules were not PET avid.  A lingular nodule increased in size a 9 mm from 3 mm  CT chest 10/17/2021 reviewed by me showed bullous centrilobular emphysema, evidence of her right upper lobe lobectomy, cavitary apical left upper lobe pulmonary nodule 1.0 x 1.6 cm.  There is a spiculated lingular nodule that is slightly larger at 11 x 13 mm.  Other upper lobe nodules are stable in size    Review of Systems As per HPI  Past Medical History:  Diagnosis Date   Acute respiratory failure ([place])    Breast cancer ([place])    Carotid stenosis, right    Celiac disease    Emphysema lung ([place])    History of hiatal hernia    Human metapneumovirus pneumonia March 2015   Hyperlipidemia    Hypertension    Lung cancer ([place]) 2005   Rt upper lobe   Stroke ([place])    TIA w/o defecits     Family History  Problem Relation Age of Onset   Heart disease Father    High blood pressure Father    Congestive Heart Failure Father    Leukemia Mother    Colon polyps Mother    High blood pressure Brother    Lung cancer Brother    Heart disease Brother    Diabetes Brother    Colon polyps Sister    Breast cancer Sister    High blood pressure Sister    Emphysema Sister    Pancreatic cancer Paternal  Uncle      Social History   Socioeconomic History   Marital status: Divorced    Spouse name: Not on file   Number of children: Not on file   Years of education: Not on file   Highest education level: Not on file  Occupational History   Not on file  Tobacco Use   Smoking status: Former    Packs/day: 1.00    Years: 35.00    Total pack years: 35.00    Types: Cigarettes    Quit date: 01/29/2004    Years since quitting: 17.9   Smokeless tobacco: Never  Vaping Use   Vaping Use: Never used  Substance and Sexual Activity   Alcohol use: Yes    Alcohol/week: 0.0 standard drinks of alcohol    Comment: wine occasionally   Drug use: No   Sexual activity: Not on file  Other Topics Concern   Not on file  Social History Narrative   Not on file   Social Determinants of Health   Financial Resource Strain: Not on file  Food Insecurity: Not on file  Transportation Needs: Not on file  Physical Activity: Not on file  Stress: Not on file  Social Connections: Not on file  Intimate Partner  Violence: Not on file    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  No inhaled exposures to her knowledge    Allergies  Allergen Reactions   Aspirin Other (See Comments)    "burning stomach"   Codeine Nausea Only   Vitamin D Analogs Other (See Comments)     Outpatient Medications Prior to Visit  Medication Sig Dispense Refill   albuterol (VENTOLIN HFA) 108 (90 Base) MCG/ACT inhaler      diphenhydrAMINE (BENADRYL) 25 MG tablet Take 25 mg by mouth every 8 (eight) hours as needed.     guaiFENesin (MUCINEX) 600 MG 12 hr tablet Take by mouth 2 (two) times daily.     ibuprofen (ADVIL,MOTRIN) 200 MG tablet Take 200 mg by mouth every 6 (six) hours as needed.     mometasone-formoterol (DULERA) 200-5 MCG/ACT AERO Inhale 2 puffs into the lungs 2 (two) times daily. 1 Inhaler 5   Multiple Vitamin (MULTIVITAMIN WITH MINERALS) TABS tablet Take 1 tablet  by mouth daily.     telmisartan (MICARDIS) 40 MG tablet Take 1 tablet by mouth daily.     No facility-administered medications prior to visit.        Objective:   Physical Exam  Vitals:   01/23/22 1608  BP: 128/76  Pulse: 89  Temp: 98.3 F (36.8 C)  TempSrc: Oral  SpO2: 97%  Weight: 107 lb 12.8 oz (48.9 kg)  Height: 5' 4"  (1.626 m)   Gen: Pleasant, well-nourished, in no distress,  normal affect  ENT: No lesions,  mouth clear,  oropharynx clear, no postnasal drip  Neck: No JVD, no stridor  Lungs: No use of accessory muscles, no crackles or wheezing on normal respiration, no wheeze on forced expiration  Cardiovascular: RRR, heart sounds normal, no murmur or gallops, no peripheral edema  Musculoskeletal: No deformities, no cyanosis or clubbing  Neuro: alert, awake, non focal  Skin: Warm, no lesions or rash     Assessment & Plan:  Pulmonary nodules Multiple enlarging left-sided pulmonary nodules.  Most recent CT 10/17/2021.  Plan for repeat super D CT now.  Based on the enlargement and her history of breast cancer, lung cancer, I believe she needs navigational bronchoscopy.  Talked about this with her today.  She understands the risks, benefits, rationale and agrees to proceed.  I will try to get this arranged for 02/10/2022  We reviewed your CT scan of the chest today.  We will arrange for repeat CT chest We will arrange for bronchoscopy to evaluate your pulmonary nodules.  This would be done under general anesthesia as an outpatient at [facility] endoscopy.  You will need a designated driver.  We will try to get this arranged for 02/10/2022. Follow with Dr Lamonte Sakai in 1 month or next available  COPD with emphysema [facility]) Continue your [facility] as you have been taking it  Allergic rhinitis Continue your fluticasone nasal spray as you have been taking it   Baltazar Apo, MD, PhD 01/23/2022, 4:53 PM [facility] [phone] or if no answer before 7:00PM  call [phone] For any issues after 7:00PM please call eLink [phone]

## 2022-01-23 NOTE — Assessment & Plan Note (Signed)
Continue your fluticasone nasal spray as you have been taking it

## 2022-01-30 ENCOUNTER — Ambulatory Visit (HOSPITAL_COMMUNITY)
Admission: RE | Admit: 2022-01-30 | Discharge: 2022-01-30 | Disposition: A | Discharge status: Home / Self Care | Payer: Medicare HMO | Source: Ambulatory Visit | Attending: Emergency Medicine | Admitting: Emergency Medicine

## 2022-01-30 DIAGNOSIS — R918 Other nonspecific abnormal finding of lung field: Secondary | ICD-10-CM | POA: Diagnosis not present

## 2022-01-30 DIAGNOSIS — J439 Emphysema, unspecified: Secondary | ICD-10-CM | POA: Diagnosis not present

## 2022-02-10 ENCOUNTER — Encounter: Payer: Self-pay | Admitting: Internal Medicine

## 2022-02-10 ENCOUNTER — Ambulatory Visit: Payer: Medicare HMO | Admitting: Internal Medicine

## 2022-02-10 VITALS — BP 128/68 | HR 82 | Temp 97.8°F | Ht 64.0 in | Wt 108.0 lb

## 2022-02-10 DIAGNOSIS — J449 Chronic obstructive pulmonary disease, unspecified: Secondary | ICD-10-CM

## 2022-02-10 DIAGNOSIS — R918 Other nonspecific abnormal finding of lung field: Secondary | ICD-10-CM | POA: Diagnosis not present

## 2022-02-10 NOTE — Progress Notes (Signed)
OV 07/11/2021  Subjective:  Patient ID: Jessica Gilbert, female , DOB: 26-Jul-1945 , age 76 y.o. , MRN: [id number] , ADDRESS: [address] [place] [address] PCP Vernie Shanks, MD Patient Care Team: Vernie Shanks, MD as PCP - General (Family Medicine) Magrinat, Virgie Dad, MD as Consulting Physician (Oncology) [name], MD as Attending Physician (Radiation Oncology) Jovita Kussmaul, MD as Consulting Physician (General Surgery) Mauro Kaufmann, RN as Oncology Nurse Navigator Rockwell Germany, RN as Oncology Nurse Navigator  This Provider for this visit: Treatment Team:  Attending Provider: Brand Males, MD    07/11/2021 -   Chief Complaint  Patient presents with   Consult    Consult for lung infection.Pt states that she has some issues with difficultly breathing and sinus issues noted. This has been occurring for 8-9 years now      HPI Jessica Gilbert 76 y.o. -as a new consultation referred by her now retired Dr. Jana Hakim.  Patient has had multiple cancers.  In 2005 she had lung cancer with right upper lobectomy by Dr. Jearld Fenton who has since retired.  Then in 2017 and 2020 when she has had breast cancer.  Both are under remission and she is on observation.  She has a long history of COPD for over 10 years.  She quit smoking many years ago.  She is on [place].  She does not like Respimat.  She has significant dyspnea on exertion relieved by rest for exertion.  Probably class II-3.  It is stable according to her.  However towards the end of 2022 when she saw Dr. Jana Hakim she had a variation in her shortness of breath.  In addition she has had chronic sinus complaints and she felt that the sinus drainage was worse in the last 1 year.  She is not on any treatment for this.  Because of this she had a CT scan of the chest.  This shows left upper lobe [contralateral to the previous lung cancer] greater than 1 cm cavity in the left apex posteriorly.  In the right below  that some of the smaller nodules.  Radiologist feels these are infection.  The apical 1 to me on my personal visualization looks like cavitary nodule.  Given the previous history of cancers.  Is concerning for malignancy.  Her last PFT was some years ago.  It is personally reviewed and visualized.   CT Chest data 05/07/21  Narrative & Impression  CLINICAL DATA:  76 year old female with history of breast cancer with history of weight loss. Follow-up study.   EXAM: CT CHEST WITH CONTRAST   TECHNIQUE: Multidetector CT imaging of the chest was performed during intravenous contrast administration.   CONTRAST:  61m OMNIPAQUE IOHEXOL 350 MG/ML SOLN   COMPARISON:  Chest CT 03/14/2014.   FINDINGS: Cardiovascular: Heart size is normal. Small amount of anterior pericardial fluid and/or thickening, unlikely to be of any hemodynamic significance at this time. No associated pericardial calcification. There is aortic atherosclerosis, as well as atherosclerosis of the great vessels of the mediastinum and the coronary arteries, including calcified atherosclerotic plaque in the left main, left anterior descending, left circumflex and right coronary arteries.   Mediastinum/Nodes: No pathologically enlarged mediastinal, internal mammary or hilar lymph nodes. Esophagus is unremarkable in appearance. No axillary or subpectoral lymphadenopathy.   Lungs/Pleura: Status post right upper lobectomy. Compensatory hyperexpansion of the right middle and lower lobes. In the left upper lobe there are numerous nodular areas  of architectural distortion, particularly near the apex. The largest of these has some internal cavitation (axial image 21 of series 7) measuring up to 1.5 x 1.2 x 1.7 cm, and is in contact with the pleural surface superiorly. Several nearby satellite nodules are noted in the left upper lobe, largest of which measures only 7 x 6 mm (axial image 36 of series 7). No acute consolidative  airspace disease. No pleural effusions. Diffuse bronchial wall thickening with severe centrilobular and paraseptal emphysema.   Upper Abdomen: Aortic atherosclerosis.   Musculoskeletal: There are no aggressive appearing lytic or blastic lesions noted in the visualized portions of the skeleton.   IMPRESSION: 1. Multiple new nodular areas of architectural distortion in the left upper lobe near the apex, largest of which has some internal cavitation. These are nonspecific, and favored to be of infectious or inflammatory etiology, however, underlying neoplasm is difficult to entirely exclude. Further clinical evaluation is recommended. Consideration for short-term trial of antimicrobial therapy followed by repeat noncontrast chest CT in 3-4 weeks is suggested for initial evaluation. PET-CT is not encouraged, as the likelihood of a false-positive examination in the setting of infection is very high. 2. Diffuse bronchial wall thickening with severe centrilobular and paraseptal emphysema; imaging findings suggestive of underlying COPD. 3. Aortic atherosclerosis, in addition to left main and 3 vessel coronary artery disease. Assessment for potential risk factor modification, dietary therapy or pharmacologic therapy may be warranted, if clinically indicated.   These results will be called to the ordering clinician or representative by the Radiologist Assistant, and communication documented in the PACS or [employer].   Aortic Atherosclerosis (ICD10-I70.0) and Emphysema (ICD10-J43.9).     Electronically Signed   By: Vinnie Langton M.D.   On: 05/08/2021 05:21      08/16/2021 Pt. Presents for follow up.  She had PFTs this morning which endorsed severe obstruction, and reduced DLCO.  Patient refuses to believe that she has COPD, insisting that she has asthma and that smoking did not contribute to her current pulmonary situation.  She endorses a lot of thick secretions, they are  creamy to white. She has not been using her flutter valve.  She has not been using her Mucinex.  She did start using Flonase per Dr. Golden Pop instructions, and states this is helped a lot with her sinus symptoms.  Patient continues to endorse shortness of breath with exertion.  She is compliant with her Dulera and her albuterol.  Saturations today upon evaluation in the office were 92% on room air.  Patient weight is 110 pounds 9.6 ounces today, BMI is 18.98 kg/m Last office visit 07/11/2021 patient weight was 111 pounds 12.8 ounces, BMI was 19.9 kg/m  OV 11/18/2021  Subjective:  Patient ID: Jessica Gilbert, female , DOB: [birth year] , age 61 y.o. , MRN: [id number] , ADDRESS: [address] [place] [address] PCP Vernie Shanks, MD Patient Care Team: Vernie Shanks, MD as PCP - General (Family Medicine) Magrinat, Virgie Dad, MD (Inactive) as Consulting Physician (Oncology) [name], MD as Attending Physician (Radiation Oncology) Jovita Kussmaul, MD as Consulting Physician (General Surgery) Mauro Kaufmann, RN as Oncology Nurse Navigator Rockwell Germany, RN as Oncology Nurse Navigator  This Provider for this visit: Treatment Team:  Attending Provider: Brand Males, MD    11/18/2021 -   Chief Complaint  Patient presents with   Follow-up    Pt recently had a CT performed and is here today to go over the results.  HPI SMITA LESH 76 y.o. -   #COPD follow-up Spiriva in the past and did not tolerate that.  Nurse practitioner put her on bREZTRI but she went back to [facility].  Currently COPD is stable.  No new complaints.  #Pulmonary nodule: She has previous history of breast cancer and lung cancer.  Previous lobectomy on the right side.  She is now being followed for nodules.  She had PET scan that was indeterminate/slightly active.  This was then followed up with a CT scan of the chest a month ago.  This shows the left apical 1.6 cm nodule to be stable but the  lingular nodule has enlarged to 1.1 cm and it is cavitating.  She is aware this could be cancer given the prior history of cancer.  We discussed methods of diagnosis.  She definitely does not want a transthoracic CT-guided biopsy because of bad experience in the past.  I did explain to her that the lesion is too deep to even think about that approach.  We discussed bronchoscopy through navigational bronchoscopy and endobronchial ultrasound by our proceduralist Dr. Leory Plowman Icard/Robert Byrum.  She is willing to proceed. Risks of pneumothorax, hemothorax, sedation/anesthesia complications such as cardiac or respiratory arrest or hypotension, stroke and bleeding all explained. Benefits of diagnosis but limitations of non-diagnosis also explained. Patient verbalized understanding and wished to proceed.     PET scan 07/22/21  IMPRESSION: Persistent pulmonary nodules in left lung apex, with dominant 13 mm cavitary nodule showing mild hypermetabolic activity. These remain indeterminate, and may be infectious or inflammatory in etiology, although malignancy cannot be excluded.   Increased size of 9 mm hypermetabolic pulmonary nodule in lingula. Given short interval from prior chest CT, this may represent an infectious or inflammatory etiology, although malignancy cannot be excluded.   No evidence of malignancy within the neck, abdomen, or pelvis.     Electronically Signed   By: Marlaine Hind M.D.   On: 07/23/2021 12:20    CT Chest data 10/17/21  Narrative & Impression  CLINICAL DATA:  Abnormal chest radiograph, pulmonary nodule. Remote history of lung cancer treated with chemotherapy and radiation therapy as well as surgery. Former smoker.   EXAM: CT CHEST WITHOUT CONTRAST   TECHNIQUE: Multidetector CT imaging of the chest was performed following the standard protocol without IV contrast.   RADIATION DOSE REDUCTION: This exam was performed according to the departmental dose-optimization  program which includes automated exposure control, adjustment of the mA and/or kV according to patient size and/or use of iterative reconstruction technique.   COMPARISON:  PET 07/22/2021 and 05/07/2021.   FINDINGS: Cardiovascular: Atherosclerotic calcification of the aorta, aortic valve and coronary arteries. Heart size normal. No pericardial effusion.   Mediastinum/Nodes: No pathologically enlarged mediastinal or axillary lymph nodes. Surgical clips in the left axilla. Hilar regions are difficult to definitively evaluate without IV contrast. Esophagus is grossly unremarkable.   Lungs/Pleura: Bullous centrilobular emphysema. Right upper lobectomy with marked volume loss in the right middle lobe. Mild pleuroparenchymal scarring in the lateral right lower lobe.   Cavitary apical left upper lobe nodule is stable in size, 1.0 x 1.6 cm (5/19). Spiculated lingular nodule has enlarged slightly, now measuring 11 x 13 mm (5/106), previously 5 x 9 mm on 05/07/2021. Additional left upper lobe nodules measure up to 6 mm (5/26), also stable. New focal subpleural consolidation in the left lower lobe (5/125).   No pleural fluid.  Debris is seen in the airway.   Upper Abdomen: Visualized  portions of the liver, adrenal glands, kidneys, spleen and stomach are grossly unremarkable.   Musculoskeletal: Degenerative changes in the spine. Right thoracotomies. No worrisome lytic or sclerotic lesions.   IMPRESSION: 1. Enlarging spiculated lingular nodule, hypermetabolic on 82/95/6213 and highly worrisome for bronchogenic carcinoma. Other apical left upper lobe nodules are stable. Continued attention on follow-up recommended. 2. New subpleural consolidation in the left lower lobe is likely infectious/inflammatory in etiology. Short-term follow-up CT chest without contrast in 3-4 weeks could be performed in further initial evaluation, as clinically indicated, as malignancy cannot be definitively  excluded. 3. Aortic atherosclerosis (ICD10-I70.0). Coronary artery calcification. 4.  Emphysema (ICD10-J43.9).     Electronically Signed   By: Lorin Picket M.D.   On: 10/18/2021 07:50    No results found.  OV 02/10/2022  Subjective:  Patient ID: Jessica Gilbert, female , DOB: 1945/08/15 , age 40 y.o. , MRN: [id number] , ADDRESS: [address] [place] [address] PCP Faustino Congress, NP Patient Care Team: Faustino Congress, NP as PCP - General (Family Medicine) Magrinat, Virgie Dad, MD (Inactive) as Consulting Physician (Oncology) [name], MD as Attending Physician (Radiation Oncology) Jovita Kussmaul, MD as Consulting Physician (General Surgery) Mauro Kaufmann, RN as Oncology Nurse Navigator Rockwell Germany, RN as Oncology Nurse Navigator  This Provider for this visit: Treatment Team:  Attending Provider: Brand Males, MD   02/10/2022 -   Chief Complaint  Patient presents with   Follow-up    Pt states she has been doing okay since last visit and denies any complaints.    #COPD follow-up Spiriva in the past and did not tolerate that.  Nurse practitioner put her on bREZTRI but she went back to [facility].    #  Multiple lung nodules on CT - May 2023 and Aug 2023  -spiculated lingula nodule enlarged may 2023 - 15m(prior 964mand persisent Aug 2023  - LUL apical nodule 1.6cm stable and same Aug 2023  - LLL improved but with residual ground glass Aug 2023 History of breast cancer History of lung cancer History of lobectomy of lung  HPI NaKAITLYND PHILLIPS89.o. -returns for follow-up.  Symptom wise she is stable.  She is on [place] For the nodule she did have a super D CT after seeing Dr. RoBaltazar Apo The lingular nodule is persistent the left upper lobe nodule is persistent.  The left lower lobe nodule has improved and there is a residual groundglass.  She was worried if the groundglass represents active infection.  I replied in the negative but did  indicate that with bronchoscopy and lavage we could potentially discover any chronic infections.  We discussed the possibility this could be respiratory virus.  I replied that it was very low likelihood.  Lingular nodule suspicious for cancer Dr. RoBaltazar Apos going to do a bronchoscopy on her in February 24, 2022.  We discussed the possibility about getting a arterial blood gas test preprocedure but she did not seem to interested.  This is because of logistical inconvenience going to the hospital.  She is on room air and she is quite ambulatory.  Her baseline bicarb is fine and Dr. ByLamonte Sakaias evaluated her.  Therefore we took a overall shared decision making to hold off on blood gas testing.    CT Chest data  No results found.    PFT     Latest Ref Rng & Units 08/16/2021   10:49 AM 02/16/2014    9:52  AM  PFT Results  FVC-Pre L 1.91  2.16   FVC-Predicted Pre % 70  72   FVC-Post L 1.98  2.27   FVC-Predicted Post % 72  76   Pre FEV1/FVC % % 32  35   Post FEV1/FCV % % 34  36   FEV1-Pre L 0.61  0.76   FEV1-Predicted Pre % 29  33   FEV1-Post L 0.67  0.82   DLCO uncorrected ml/min/mmHg 8.40  12.06   DLCO UNC% % 45  52   DLCO corrected ml/min/mmHg 8.40    DLCO COR %Predicted % 45    DLVA Predicted % 54  57   TLC L 5.77  6.03   TLC % Predicted % 117  123   RV % Predicted % 161  159        has a past medical history of Acute respiratory failure ([place]), Breast cancer ([place]), Carotid stenosis, right, Celiac disease, Emphysema lung ([place]), History of hiatal hernia, Human metapneumovirus pneumonia (March 2015), Hyperlipidemia, Hypertension, Lung cancer ([place]) (2005), and Stroke ([place]).   reports that she quit smoking about 18 years ago. Her smoking use included cigarettes. She has a 35.00 pack-year smoking history. She has never used smokeless tobacco.  Past Surgical History:  Procedure Laterality Date   BREAST LUMPECTOMY     left    BREAST LUMPECTOMY WITH RADIOACTIVE SEED AND SENTINEL  LYMPH NODE BIOPSY Left 07/17/2015   Procedure: LEFT BREAST LUMPECTOMY WITH RADIOACTIVE SEED WITH LEFT AXILLARY SENTINEL LYMPH NODE BIOPSY;  Surgeon: [name], MD;  Location: [facility];  Service: General;  Laterality: Left;   BREAST LUMPECTOMY WITH RADIOACTIVE SEED AND SENTINEL LYMPH NODE BIOPSY Left 05/28/2020   Procedure: LEFT BREAST LUMPECTOMY WITH RADIOACTIVE SEED AND SENTINEL LYMPH NODE BIOPSY;  Surgeon: Jovita Kussmaul, MD;  Location: [facility];  Service: General;  Laterality: Left;   LUNG REMOVAL, PARTIAL Right 2005   Upper lobe   TRIGGER FINGER RELEASE     TUBAL LIGATION  1980    Allergies  Allergen Reactions   Aspirin Other (See Comments)    "burning stomach"   Codeine Nausea Only   Vitamin D Analogs Other (See Comments)    Immunization History  Administered Date(s) Administered   Fluad Quad(high Dose 65+) 03/15/2016   Influenza Split 03/16/2013, 04/11/2013, 03/15/2014   Influenza, High Dose Seasonal PF 04/22/2015, 03/24/2017, 04/02/2018, 04/05/2019, 04/03/2020, 03/23/2021   Influenza-Unspecified 03/15/2014   Moderna Sars-Covid-2 Vaccination 08/16/2019, 09/14/2019, 08/31/2020   Pneumococcal Polysaccharide-23 02/19/2012    Family History  Problem Relation Age of Onset   Heart disease Father    High blood pressure Father    Congestive Heart Failure Father    Leukemia Mother    Colon polyps Mother    High blood pressure Brother    Lung cancer Brother    Heart disease Brother    Diabetes Brother    Colon polyps Sister    Breast cancer Sister    High blood pressure Sister    Emphysema Sister    Pancreatic cancer Paternal Uncle      Current Outpatient Medications:    Albuterol Sulfate (PROAIR HFA IN), Inhale into the lungs., Disp: , Rfl:    diphenhydrAMINE (BENADRYL) 25 MG tablet, Take 25 mg by mouth every 8 (eight) hours as needed., Disp: , Rfl:    fluticasone (FLONASE SENSIMIST) 27.5 MCG/SPRAY nasal spray, , Disp: , Rfl:     guaiFENesin (MUCINEX) 600 MG 12 hr tablet, Take by  mouth 2 (two) times daily., Disp: , Rfl:    ibuprofen (ADVIL,MOTRIN) 200 MG tablet, Take 200 mg by mouth every 6 (six) hours as needed., Disp: , Rfl:    mometasone-formoterol (DULERA) 200-5 MCG/ACT AERO, Inhale 2 puffs into the lungs 2 (two) times daily., Disp: 1 Inhaler, Rfl: 5   Multiple Vitamin (MULTIVITAMIN WITH MINERALS) TABS tablet, Take 1 tablet by mouth daily., Disp: , Rfl:    telmisartan (MICARDIS) 40 MG tablet, Take 1 tablet by mouth daily., Disp: , Rfl:       Objective:   Vitals:   02/10/22 1420  BP: 128/68  Pulse: 82  Temp: 97.8 F (36.6 C)  TempSrc: Oral  SpO2: 96%  Weight: 108 lb (49 kg)  Height: 5' 4"  (1.626 m)    Estimated body mass index is 18.54 kg/m as calculated from the following:   Height as of this encounter: 5' 4"  (1.626 m).   Weight as of this encounter: 108 lb (49 kg).  @WEIGHTCHANGE @  [employer]   02/10/22 1420  Weight: 108 lb (49 kg)     Physical Exam   General: No distress. Looks well Neuro: Alert and Oriented x 3. GCS 15. Speech normal Psych: Pleasant Resp:  Barrel Chest - yes.  Wheeze - no, Crackles - no, No overt respiratory distress.  Prolonged expiration.  Mild use of accessory muscles.  Slight barrel chest. CVS: Normal heart sounds. Murmurs - no Ext: Stigmata of Connective Tissue Disease - no HEENT: Normal upper airway. PEERL +. No post nasal drip        Assessment:       ICD-10-CM   1. Multiple lung nodules on CT  R91.8     2. Chronic obstructive pulmonary disease, unspecified COPD type ([place])  J44.9          Plan:     Patient Instructions     ICD-10-CM   1. Multiple lung nodules on CT  R91.8     2. History of breast cancer  Z85.3     3. History of lung cancer  Z85.118     4. History of lobectomy of lung  Z90.2     5. Chronic obstructive pulmonary disease, unspecified COPD type ([place])  J44.9      Multiple lung nodules on CT - May 2023 and Aug  2023  -spiculated lingula nodule enlarged may 2023 - 86m(prior 915mand persisent Aug 2023  - LUL apical nodule 1.6cm stable and same Aug 2023  - LLL improved but with residual ground glass Aug 2023 History of breast cancer History of lung cancer History of lobectomy of lung   - overall concerning for lung cancer on left side esp lingula  Plan  - biopsy 02/24/22 by Dr ByLamonte Sakai hold off ABG pre-procedure testing after iour discussion  Chronic obstructive pulmonary disease, unspecified COPD type ([place] - stable without flare up - not on home o2  Plan  --continue dulera scheduled with albuterol as needed - hold off ABG (per our shared decisin making) - flu shot, rsv shot, covid vaccine in the fall   Followup - 6 months Evelynn Hench -routine visit    SIGNATURE    Dr. MuBrand MalesM.D., F.C.C.P,  Pulmonary and Critical Care Medicine Staff Physician, CoVolgairector - Interstitial Lung Disease  Program  Pulmonary [id number]: [phone] no answer or between  15:00h - 7:00h: call  [place] Telephone: [phone]  2:45 PM 02/10/2022

## 2022-02-10 NOTE — H&P (View-Only) (Signed)
OV 07/11/2021  Subjective:  Patient ID: Jessica Gilbert, female , DOB: [birth year] , age 76 y.o. , MRN: [id number] , ADDRESS: [address] [place] [address] PCP Vernie Shanks, MD Patient Care Team: Vernie Shanks, MD as PCP - General (Family Medicine) Magrinat, Virgie Dad, MD as Consulting Physician (Oncology) [name], MD as Attending Physician (Radiation Oncology) Jovita Kussmaul, MD as Consulting Physician (General Surgery) Mauro Kaufmann, RN as Oncology Nurse Navigator Rockwell Germany, RN as Oncology Nurse Navigator  This Provider for this visit: Treatment Team:  Attending Provider: Brand Males, MD    07/11/2021 -   Chief Complaint  Patient presents with   Consult    Consult for lung infection.Pt states that she has some issues with difficultly breathing and sinus issues noted. This has been occurring for 8-9 years now      HPI Jessica Gilbert 76 y.o. -as a new consultation referred by her now retired Dr. Jana Hakim.  Patient has had multiple cancers.  In 2005 she had lung cancer with right upper lobectomy by Dr. Jearld Fenton who has since retired.  Then in 2017 and 2020 when she has had breast cancer.  Both are under remission and she is on observation.  She has a long history of COPD for over 10 years.  She quit smoking many years ago.  She is on [place].  She does not like Respimat.  She has significant dyspnea on exertion relieved by rest for exertion.  Probably class II-3.  It is stable according to her.  However towards the end of 2022 when she saw Dr. Jana Hakim she had a variation in her shortness of breath.  In addition she has had chronic sinus complaints and she felt that the sinus drainage was worse in the last 1 year.  She is not on any treatment for this.  Because of this she had a CT scan of the chest.  This shows left upper lobe [contralateral to the previous lung cancer] greater than 1 cm cavity in the left apex posteriorly.  In the right below  that some of the smaller nodules.  Radiologist feels these are infection.  The apical 1 to me on my personal visualization looks like cavitary nodule.  Given the previous history of cancers.  Is concerning for malignancy.  Her last PFT was some years ago.  It is personally reviewed and visualized.   CT Chest data 05/07/21  Narrative & Impression  CLINICAL DATA:  76 year old female with history of breast cancer with history of weight loss. Follow-up study.   EXAM: CT CHEST WITH CONTRAST   TECHNIQUE: Multidetector CT imaging of the chest was performed during intravenous contrast administration.   CONTRAST:  95m OMNIPAQUE IOHEXOL 350 MG/ML SOLN   COMPARISON:  Chest CT 03/14/2014.   FINDINGS: Cardiovascular: Heart size is normal. Small amount of anterior pericardial fluid and/or thickening, unlikely to be of any hemodynamic significance at this time. No associated pericardial calcification. There is aortic atherosclerosis, as well as atherosclerosis of the great vessels of the mediastinum and the coronary arteries, including calcified atherosclerotic plaque in the left main, left anterior descending, left circumflex and right coronary arteries.   Mediastinum/Nodes: No pathologically enlarged mediastinal, internal mammary or hilar lymph nodes. Esophagus is unremarkable in appearance. No axillary or subpectoral lymphadenopathy.   Lungs/Pleura: Status post right upper lobectomy. Compensatory hyperexpansion of the right middle and lower lobes. In the left upper lobe there are numerous nodular areas  of architectural distortion, particularly near the apex. The largest of these has some internal cavitation (axial image 21 of series 7) measuring up to 1.5 x 1.2 x 1.7 cm, and is in contact with the pleural surface superiorly. Several nearby satellite nodules are noted in the left upper lobe, largest of which measures only 7 x 6 mm (axial image 36 of series 7). No acute consolidative  airspace disease. No pleural effusions. Diffuse bronchial wall thickening with severe centrilobular and paraseptal emphysema.   Upper Abdomen: Aortic atherosclerosis.   Musculoskeletal: There are no aggressive appearing lytic or blastic lesions noted in the visualized portions of the skeleton.   IMPRESSION: 1. Multiple new nodular areas of architectural distortion in the left upper lobe near the apex, largest of which has some internal cavitation. These are nonspecific, and favored to be of infectious or inflammatory etiology, however, underlying neoplasm is difficult to entirely exclude. Further clinical evaluation is recommended. Consideration for short-term trial of antimicrobial therapy followed by repeat noncontrast chest CT in 3-4 weeks is suggested for initial evaluation. PET-CT is not encouraged, as the likelihood of a false-positive examination in the setting of infection is very high. 2. Diffuse bronchial wall thickening with severe centrilobular and paraseptal emphysema; imaging findings suggestive of underlying COPD. 3. Aortic atherosclerosis, in addition to left main and 3 vessel coronary artery disease. Assessment for potential risk factor modification, dietary therapy or pharmacologic therapy may be warranted, if clinically indicated.   These results will be called to the ordering clinician or representative by the Radiologist Assistant, and communication documented in the PACS or [employer].   Aortic Atherosclerosis (ICD10-I70.0) and Emphysema (ICD10-J43.9).     Electronically Signed   By: Vinnie Langton M.D.   On: 05/08/2021 05:21      08/16/2021 Pt. Presents for follow up.  She had PFTs this morning which endorsed severe obstruction, and reduced DLCO.  Patient refuses to believe that she has COPD, insisting that she has asthma and that smoking did not contribute to her current pulmonary situation.  She endorses a lot of thick secretions, they are  creamy to white. She has not been using her flutter valve.  She has not been using her Mucinex.  She did start using Flonase per Dr. Golden Pop instructions, and states this is helped a lot with her sinus symptoms.  Patient continues to endorse shortness of breath with exertion.  She is compliant with her Dulera and her albuterol.  Saturations today upon evaluation in the office were 92% on room air.  Patient weight is 110 pounds 9.6 ounces today, BMI is 18.98 kg/m Last office visit 07/11/2021 patient weight was 111 pounds 12.8 ounces, BMI was 19.9 kg/m  OV 11/18/2021  Subjective:  Patient ID: Jessica Gilbert, female , DOB: Jan 12, 1946 , age 86 y.o. , MRN: [id number] , ADDRESS: [address] [place] [address] PCP Vernie Shanks, MD Patient Care Team: Vernie Shanks, MD as PCP - General (Family Medicine) Magrinat, Virgie Dad, MD (Inactive) as Consulting Physician (Oncology) [name], MD as Attending Physician (Radiation Oncology) Jovita Kussmaul, MD as Consulting Physician (General Surgery) Mauro Kaufmann, RN as Oncology Nurse Navigator Rockwell Germany, RN as Oncology Nurse Navigator  This Provider for this visit: Treatment Team:  Attending Provider: Brand Males, MD    11/18/2021 -   Chief Complaint  Patient presents with   Follow-up    Pt recently had a CT performed and is here today to go over the results.  HPI Jessica Gilbert 76 y.o. -   #COPD follow-up Spiriva in the past and did not tolerate that.  Nurse practitioner put her on bREZTRI but she went back to [facility].  Currently COPD is stable.  No new complaints.  #Pulmonary nodule: She has previous history of breast cancer and lung cancer.  Previous lobectomy on the right side.  She is now being followed for nodules.  She had PET scan that was indeterminate/slightly active.  This was then followed up with a CT scan of the chest a month ago.  This shows the left apical 1.6 cm nodule to be stable but the  lingular nodule has enlarged to 1.1 cm and it is cavitating.  She is aware this could be cancer given the prior history of cancer.  We discussed methods of diagnosis.  She definitely does not want a transthoracic CT-guided biopsy because of bad experience in the past.  I did explain to her that the lesion is too deep to even think about that approach.  We discussed bronchoscopy through navigational bronchoscopy and endobronchial ultrasound by our proceduralist Dr. Leory Plowman Icard/Robert Byrum.  She is willing to proceed. Risks of pneumothorax, hemothorax, sedation/anesthesia complications such as cardiac or respiratory arrest or hypotension, stroke and bleeding all explained. Benefits of diagnosis but limitations of non-diagnosis also explained. Patient verbalized understanding and wished to proceed.     PET scan 07/22/21  IMPRESSION: Persistent pulmonary nodules in left lung apex, with dominant 13 mm cavitary nodule showing mild hypermetabolic activity. These remain indeterminate, and may be infectious or inflammatory in etiology, although malignancy cannot be excluded.   Increased size of 9 mm hypermetabolic pulmonary nodule in lingula. Given short interval from prior chest CT, this may represent an infectious or inflammatory etiology, although malignancy cannot be excluded.   No evidence of malignancy within the neck, abdomen, or pelvis.     Electronically Signed   By: Marlaine Hind M.D.   On: 07/23/2021 12:20    CT Chest data 10/17/21  Narrative & Impression  CLINICAL DATA:  Abnormal chest radiograph, pulmonary nodule. Remote history of lung cancer treated with chemotherapy and radiation therapy as well as surgery. Former smoker.   EXAM: CT CHEST WITHOUT CONTRAST   TECHNIQUE: Multidetector CT imaging of the chest was performed following the standard protocol without IV contrast.   RADIATION DOSE REDUCTION: This exam was performed according to the departmental dose-optimization  program which includes automated exposure control, adjustment of the mA and/or kV according to patient size and/or use of iterative reconstruction technique.   COMPARISON:  PET 07/22/2021 and 05/07/2021.   FINDINGS: Cardiovascular: Atherosclerotic calcification of the aorta, aortic valve and coronary arteries. Heart size normal. No pericardial effusion.   Mediastinum/Nodes: No pathologically enlarged mediastinal or axillary lymph nodes. Surgical clips in the left axilla. Hilar regions are difficult to definitively evaluate without IV contrast. Esophagus is grossly unremarkable.   Lungs/Pleura: Bullous centrilobular emphysema. Right upper lobectomy with marked volume loss in the right middle lobe. Mild pleuroparenchymal scarring in the lateral right lower lobe.   Cavitary apical left upper lobe nodule is stable in size, 1.0 x 1.6 cm (5/19). Spiculated lingular nodule has enlarged slightly, now measuring 11 x 13 mm (5/106), previously 5 x 9 mm on 05/07/2021. Additional left upper lobe nodules measure up to 6 mm (5/26), also stable. New focal subpleural consolidation in the left lower lobe (5/125).   No pleural fluid.  Debris is seen in the airway.   Upper Abdomen: Visualized  portions of the liver, adrenal glands, kidneys, spleen and stomach are grossly unremarkable.   Musculoskeletal: Degenerative changes in the spine. Right thoracotomies. No worrisome lytic or sclerotic lesions.   IMPRESSION: 1. Enlarging spiculated lingular nodule, hypermetabolic on 27/74/1287 and highly worrisome for bronchogenic carcinoma. Other apical left upper lobe nodules are stable. Continued attention on follow-up recommended. 2. New subpleural consolidation in the left lower lobe is likely infectious/inflammatory in etiology. Short-term follow-up CT chest without contrast in 3-4 weeks could be performed in further initial evaluation, as clinically indicated, as malignancy cannot be definitively  excluded. 3. Aortic atherosclerosis (ICD10-I70.0). Coronary artery calcification. 4.  Emphysema (ICD10-J43.9).     Electronically Signed   By: Lorin Picket M.D.   On: 10/18/2021 07:50    No results found.  OV 02/10/2022  Subjective:  Patient ID: Jessica Gilbert, female , DOB: July 14, 1945 , age 2 y.o. , MRN: [id number] , ADDRESS: [address] [place] [address] PCP Faustino Congress, NP Patient Care Team: Faustino Congress, NP as PCP - General (Family Medicine) Magrinat, Virgie Dad, MD (Inactive) as Consulting Physician (Oncology) [name], MD as Attending Physician (Radiation Oncology) Jovita Kussmaul, MD as Consulting Physician (General Surgery) Mauro Kaufmann, RN as Oncology Nurse Navigator Rockwell Germany, RN as Oncology Nurse Navigator  This Provider for this visit: Treatment Team:  Attending Provider: Brand Males, MD   02/10/2022 -   Chief Complaint  Patient presents with   Follow-up    Pt states she has been doing okay since last visit and denies any complaints.    #COPD follow-up Spiriva in the past and did not tolerate that.  Nurse practitioner put her on bREZTRI but she went back to [facility]    #  Multiple lung nodules on CT - May 2023 and Aug 2023  -spiculated lingula nodule enlarged may 2023 - 55m(prior 942mand persisent Aug 2023  - LUL apical nodule 1.6cm stable and same Aug 2023  - LLL improved but with residual ground glass Aug 2023 History of breast cancer History of lung cancer History of lobectomy of lung  HPI NaNAMIRA ROSEKRANS89.o. -returns for follow-up.  Symptom wise she is stable.  She is on [place] For the nodule she did have a super D CT after seeing Dr. RoBaltazar Apo The lingular nodule is persistent the left upper lobe nodule is persistent.  The left lower lobe nodule has improved and there is a residual groundglass.  She was worried if the groundglass represents active infection.  I replied in the negative but did  indicate that with bronchoscopy and lavage we could potentially discover any chronic infections.  We discussed the possibility this could be respiratory virus.  I replied that it was very low likelihood.  Lingular nodule suspicious for cancer Dr. RoBaltazar Apos going to do a bronchoscopy on her in February 24, 2022.  We discussed the possibility about getting a arterial blood gas test preprocedure but she did not seem to interested.  This is because of logistical inconvenience going to the hospital.  She is on room air and she is quite ambulatory.  Her baseline bicarb is fine and Dr. ByLamonte Sakaias evaluated her.  Therefore we took a overall shared decision making to hold off on blood gas testing.    CT Chest data  No results found.    PFT     Latest Ref Rng & Units 08/16/2021   10:49 AM 02/16/2014    9:52  AM  PFT Results  FVC-Pre L 1.91  2.16   FVC-Predicted Pre % 70  72   FVC-Post L 1.98  2.27   FVC-Predicted Post % 72  76   Pre FEV1/FVC % % 32  35   Post FEV1/FCV % % 34  36   FEV1-Pre L 0.61  0.76   FEV1-Predicted Pre % 29  33   FEV1-Post L 0.67  0.82   DLCO uncorrected ml/min/mmHg 8.40  12.06   DLCO UNC% % 45  52   DLCO corrected ml/min/mmHg 8.40    DLCO COR %Predicted % 45    DLVA Predicted % 54  57   TLC L 5.77  6.03   TLC % Predicted % 117  123   RV % Predicted % 161  159        has a past medical history of Acute respiratory failure ([place]), Breast cancer ([place]), Carotid stenosis, right, Celiac disease, Emphysema lung ([place]), History of hiatal hernia, Human metapneumovirus pneumonia (March 2015), Hyperlipidemia, Hypertension, Lung cancer ([place]) (2005), and Stroke ([place]).   reports that she quit smoking about 18 years ago. Her smoking use included cigarettes. She has a 35.00 pack-year smoking history. She has never used smokeless tobacco.  Past Surgical History:  Procedure Laterality Date   BREAST LUMPECTOMY     left    BREAST LUMPECTOMY WITH RADIOACTIVE SEED AND SENTINEL  LYMPH NODE BIOPSY Left 07/17/2015   Procedure: LEFT BREAST LUMPECTOMY WITH RADIOACTIVE SEED WITH LEFT AXILLARY SENTINEL LYMPH NODE BIOPSY;  Surgeon: [name], MD;  Location: [facility];  Service: General;  Laterality: Left;   BREAST LUMPECTOMY WITH RADIOACTIVE SEED AND SENTINEL LYMPH NODE BIOPSY Left 05/28/2020   Procedure: LEFT BREAST LUMPECTOMY WITH RADIOACTIVE SEED AND SENTINEL LYMPH NODE BIOPSY;  Surgeon: Jovita Kussmaul, MD;  Location: [facility];  Service: General;  Laterality: Left;   LUNG REMOVAL, PARTIAL Right 2005   Upper lobe   TRIGGER FINGER RELEASE     TUBAL LIGATION  1980    Allergies  Allergen Reactions   Aspirin Other (See Comments)    "burning stomach"   Codeine Nausea Only   Vitamin D Analogs Other (See Comments)    Immunization History  Administered Date(s) Administered   Fluad Quad(high Dose 65+) 03/15/2016   Influenza Split 03/16/2013, 04/11/2013, 03/15/2014   Influenza, High Dose Seasonal PF 04/22/2015, 03/24/2017, 04/02/2018, 04/05/2019, 04/03/2020, 03/23/2021   Influenza-Unspecified 03/15/2014   Moderna Sars-Covid-2 Vaccination 08/16/2019, 09/14/2019, 08/31/2020   Pneumococcal Polysaccharide-23 02/19/2012    Family History  Problem Relation Age of Onset   Heart disease Father    High blood pressure Father    Congestive Heart Failure Father    Leukemia Mother    Colon polyps Mother    High blood pressure Brother    Lung cancer Brother    Heart disease Brother    Diabetes Brother    Colon polyps Sister    Breast cancer Sister    High blood pressure Sister    Emphysema Sister    Pancreatic cancer Paternal Uncle      Current Outpatient Medications:    Albuterol Sulfate (PROAIR HFA IN), Inhale into the lungs., Disp: , Rfl:    diphenhydrAMINE (BENADRYL) 25 MG tablet, Take 25 mg by mouth every 8 (eight) hours as needed., Disp: , Rfl:    fluticasone (FLONASE SENSIMIST) 27.5 MCG/SPRAY nasal spray, , Disp: , Rfl:     guaiFENesin (MUCINEX) 600 MG 12 hr tablet, Take by  mouth 2 (two) times daily., Disp: , Rfl:    ibuprofen (ADVIL,MOTRIN) 200 MG tablet, Take 200 mg by mouth every 6 (six) hours as needed., Disp: , Rfl:    mometasone-formoterol (DULERA) 200-5 MCG/ACT AERO, Inhale 2 puffs into the lungs 2 (two) times daily., Disp: 1 Inhaler, Rfl: 5   Multiple Vitamin (MULTIVITAMIN WITH MINERALS) TABS tablet, Take 1 tablet by mouth daily., Disp: , Rfl:    telmisartan (MICARDIS) 40 MG tablet, Take 1 tablet by mouth daily., Disp: , Rfl:       Objective:   Vitals:   02/10/22 1420  BP: 128/68  Pulse: 82  Temp: 97.8 F (36.6 C)  TempSrc: Oral  SpO2: 96%  Weight: 108 lb (49 kg)  Height: 5' 4"  (1.626 m)    Estimated body mass index is 18.54 kg/m as calculated from the following:   Height as of this encounter: 5' 4"  (1.626 m).   Weight as of this encounter: 108 lb (49 kg).  @WEIGHTCHANGE @  Filed Weights   02/10/22 1420  Weight: 108 lb (49 kg)     Physical Exam   General: No distress. Looks well Neuro: Alert and Oriented x 3. GCS 15. Speech normal Psych: Pleasant Resp:  Barrel Chest - yes.  Wheeze - no, Crackles - no, No overt respiratory distress.  Prolonged expiration.  Mild use of accessory muscles.  Slight barrel chest. CVS: Normal heart sounds. Murmurs - no Ext: Stigmata of Connective Tissue Disease - no HEENT: Normal upper airway. PEERL +. No post nasal drip        Assessment:       ICD-10-CM   1. Multiple lung nodules on CT  R91.8     2. Chronic obstructive pulmonary disease, unspecified COPD type ([place])  J44.9          Plan:     Patient Instructions     ICD-10-CM   1. Multiple lung nodules on CT  R91.8     2. History of breast cancer  Z85.3     3. History of lung cancer  Z85.118     4. History of lobectomy of lung  Z90.2     5. Chronic obstructive pulmonary disease, unspecified COPD type ([place])  J44.9      Multiple lung nodules on CT - May 2023 and Aug  2023  -spiculated lingula nodule enlarged may 2023 - 30m(prior 923mand persisent Aug 2023  - LUL apical nodule 1.6cm stable and same Aug 2023  - LLL improved but with residual ground glass Aug 2023 History of breast cancer History of lung cancer History of lobectomy of lung   - overall concerning for lung cancer on left side esp lingula  Plan  - biopsy 02/24/22 by Dr ByLamonte Sakai hold off ABG pre-procedure testing after iour discussion  Chronic obstructive pulmonary disease, unspecified COPD type ([place] - stable without flare up - not on home o2  Plan  --continue dulera scheduled with albuterol as needed - hold off ABG (per our shared decisin making) - flu shot, rsv shot, covid vaccine in the fall   Followup - 6 months Jessica Gilbert -routine visit    SIGNATURE    Dr. MuBrand MalesM.D., F.C.C.P,  Pulmonary and Critical Care Medicine Staff Physician, CoSeibertirector - Interstitial Lung Disease  Program  Pulmonary [id number]: [phone] no answer or between  15:00h - 7:00h: call  [place] Telephone: [phone]  2:45 PM 02/10/2022

## 2022-02-10 NOTE — Patient Instructions (Addendum)
ICD-10-CM   1. Multiple lung nodules on CT  R91.8     2. History of breast cancer  Z85.3     3. History of lung cancer  Z85.118     4. History of lobectomy of lung  Z90.2     5. Chronic obstructive pulmonary disease, unspecified COPD type ([place])  J44.9      Multiple lung nodules on CT - May 2023 and Aug 2023  -spiculated lingula nodule enlarged may 2023 - 32m(prior 990mand persisent Aug 2023  - LUL apical nodule 1.6cm stable and same Aug 2023  - LLL improved but with residual ground glass Aug 2023 History of breast cancer History of lung cancer History of lobectomy of lung   - overall concerning for lung cancer on left side esp lingula  Plan  - biopsy 02/24/22 by Dr ByLamonte Sakai hold off ABG pre-procedure testing after iour discussion  Chronic obstructive pulmonary disease, unspecified COPD type ([place] - stable without flare up - not on home o2  Plan  --continue dulera scheduled with albuterol as needed - hold off ABG (per our shared decisin making) - flu shot, rsv shot, covid vaccine in the fall   Followup - 6 months Casimir Barcellos -routine visit

## 2022-02-21 ENCOUNTER — Encounter (HOSPITAL_COMMUNITY): Payer: Self-pay | Admitting: Emergency Medicine

## 2022-02-21 ENCOUNTER — Telehealth: Payer: Self-pay | Admitting: Emergency Medicine

## 2022-02-21 ENCOUNTER — Other Ambulatory Visit: Payer: Self-pay

## 2022-02-21 NOTE — Progress Notes (Signed)
Spoke with pt for pre-op call. Pt denies cardiac history and Diabetes. She is treated for HTN. Pt was concerned that she didn't get her Covid test done today because the office called and told her that the tech was not available and she will need to get it done here. She wanted to know if she tested positive on Monday would her procedure be cancelled. I explained to her that the decision would be up to Dr. Lamonte Sakai. She said that she had called Dr. Agustina Caroli office and they had not returned her call so she has decided she will come for the procedure. She states the last time she was around anyone was 8 days ago and she plans not to go anywhere this weekend and she has no symptoms of Covid at this time.   Shower instructions given to pt and she voiced understanding.

## 2022-02-21 NOTE — Telephone Encounter (Signed)
Called and spoke with patient.  Patient questions about covid testing answered. Nothing further at this time.

## 2022-02-23 NOTE — Anesthesia Preprocedure Evaluation (Addendum)
Anesthesia Evaluation  Patient identified by MRN, date of birth, ID band Patient awake    Reviewed: Allergy & Precautions, NPO status , Patient's Chart, lab work & pertinent test results  Airway Mallampati: I  TM Distance: >3 FB Neck ROM: Full    Dental  (+) Dental Advisory Given, Chipped,    Pulmonary shortness of breath, COPD,  COPD inhaler, former smoker,    Pulmonary exam normal breath sounds clear to auscultation       Cardiovascular hypertension, Pt. on medications Normal cardiovascular exam Rhythm:Regular Rate:Normal  TTE 2023 1. Basal septal hypokinesis   Global longitudinal strain -25.6%. Left ventricular ejection fraction,  by estimation, is 60 to 65%. The left ventricle has normal function. Left  ventricular diastolic parameters were normal.  2. Right ventricular systolic function is normal. The right ventricular  size is normal.  3. The mitral valve is normal in structure. Mild mitral valve  regurgitation.  4. Tricuspid valve regurgitation is mild to moderate.  5. The aortic valve is tricuspid. Aortic valve regurgitation is trivial.  6. The inferior vena cava is normal in size with greater than 50%  respiratory variability, suggesting right atrial pressure of 3 mmHg.   Neuro/Psych CVA negative psych ROS   GI/Hepatic Neg liver ROS, hiatal hernia,   Endo/Other  negative endocrine ROS  Renal/GU negative Renal ROS  negative genitourinary   Musculoskeletal negative musculoskeletal ROS (+)   Abdominal   Peds  Hematology  (+) Blood dyscrasia (Factor V Leiden), ,   Anesthesia Other Findings   Reproductive/Obstetrics                            Anesthesia Physical Anesthesia Plan  ASA: 3  Anesthesia Plan: General   Post-op Pain Management: Minimal or no pain anticipated   Induction: Intravenous  PONV Risk Score and Plan: 3 and Dexamethasone, Ondansetron and Treatment  may vary due to age or medical condition  Airway Management Planned: Oral ETT  Additional Equipment:   Intra-op Plan:   Post-operative Plan: Extubation in OR  Informed Consent: I have reviewed the patients History and Physical, chart, labs and discussed the procedure including the risks, benefits and alternatives for the proposed anesthesia with the patient or authorized representative who has indicated his/her understanding and acceptance.     Dental advisory given  Plan Discussed with: CRNA  Anesthesia Plan Comments:         Anesthesia Quick Evaluation

## 2022-02-24 ENCOUNTER — Encounter (HOSPITAL_COMMUNITY): Payer: Self-pay | Admitting: Emergency Medicine

## 2022-02-24 ENCOUNTER — Ambulatory Visit (HOSPITAL_COMMUNITY)
Admission: RE | Admit: 2022-02-24 | Discharge: 2022-02-24 | Disposition: A | Discharge status: Home / Self Care | Payer: Medicare HMO | Attending: Emergency Medicine | Admitting: Emergency Medicine

## 2022-02-24 ENCOUNTER — Ambulatory Visit (HOSPITAL_COMMUNITY): Payer: Medicare HMO

## 2022-02-24 ENCOUNTER — Other Ambulatory Visit: Payer: Self-pay

## 2022-02-24 ENCOUNTER — Ambulatory Visit (HOSPITAL_BASED_OUTPATIENT_CLINIC_OR_DEPARTMENT_OTHER): Payer: Medicare HMO | Admitting: Anesthesiology

## 2022-02-24 ENCOUNTER — Ambulatory Visit (HOSPITAL_COMMUNITY): Payer: Medicare HMO | Admitting: Anesthesiology

## 2022-02-24 ENCOUNTER — Encounter (HOSPITAL_COMMUNITY)
Admission: RE | Disposition: A | Discharge status: Home / Self Care | Payer: Self-pay | Source: Home / Self Care | Attending: Emergency Medicine

## 2022-02-24 DIAGNOSIS — J449 Chronic obstructive pulmonary disease, unspecified: Secondary | ICD-10-CM | POA: Insufficient documentation

## 2022-02-24 DIAGNOSIS — Z87891 Personal history of nicotine dependence: Secondary | ICD-10-CM | POA: Insufficient documentation

## 2022-02-24 DIAGNOSIS — R918 Other nonspecific abnormal finding of lung field: Secondary | ICD-10-CM

## 2022-02-24 DIAGNOSIS — Z853 Personal history of malignant neoplasm of breast: Secondary | ICD-10-CM | POA: Insufficient documentation

## 2022-02-24 DIAGNOSIS — E785 Hyperlipidemia, unspecified: Secondary | ICD-10-CM | POA: Insufficient documentation

## 2022-02-24 DIAGNOSIS — Z85118 Personal history of other malignant neoplasm of bronchus and lung: Secondary | ICD-10-CM | POA: Diagnosis not present

## 2022-02-24 DIAGNOSIS — Z902 Acquired absence of lung [part of]: Secondary | ICD-10-CM | POA: Diagnosis not present

## 2022-02-24 DIAGNOSIS — C3410 Malignant neoplasm of upper lobe, unspecified bronchus or lung: Secondary | ICD-10-CM | POA: Diagnosis not present

## 2022-02-24 DIAGNOSIS — I10 Essential (primary) hypertension: Secondary | ICD-10-CM | POA: Insufficient documentation

## 2022-02-24 DIAGNOSIS — Z8673 Personal history of transient ischemic attack (TIA), and cerebral infarction without residual deficits: Secondary | ICD-10-CM | POA: Diagnosis not present

## 2022-02-24 DIAGNOSIS — Z20822 Contact with and (suspected) exposure to covid-19: Secondary | ICD-10-CM | POA: Diagnosis not present

## 2022-02-24 DIAGNOSIS — R846 Abnormal cytological findings in specimens from respiratory organs and thorax: Secondary | ICD-10-CM | POA: Diagnosis not present

## 2022-02-24 DIAGNOSIS — Z8719 Personal history of other diseases of the digestive system: Secondary | ICD-10-CM | POA: Insufficient documentation

## 2022-02-24 DIAGNOSIS — J439 Emphysema, unspecified: Secondary | ICD-10-CM | POA: Diagnosis not present

## 2022-02-24 HISTORY — PX: BRONCHIAL WASHINGS: SHX5105

## 2022-02-24 HISTORY — PX: FIDUCIAL MARKER PLACEMENT: SHX6858

## 2022-02-24 HISTORY — PX: BRONCHIAL BIOPSY: SHX5109

## 2022-02-24 HISTORY — DX: Dyspnea, unspecified: R06.00

## 2022-02-24 HISTORY — DX: Activated protein C resistance: D68.51

## 2022-02-24 HISTORY — PX: BRONCHIAL BRUSHINGS: SHX5108

## 2022-02-24 HISTORY — PX: VIDEO BRONCHOSCOPY WITH RADIAL ENDOBRONCHIAL ULTRASOUND: SHX6849

## 2022-02-24 HISTORY — PX: BRONCHIAL NEEDLE ASPIRATION BIOPSY: SHX5106

## 2022-02-24 LAB — CBC
HCT: 40.6 % (ref 36.0–46.0)
Hemoglobin: 13.4 g/dL (ref 12.0–15.0)
MCH: 29.1 pg (ref 26.0–34.0)
MCHC: 33 g/dL (ref 30.0–36.0)
MCV: 88.1 fL (ref 80.0–100.0)
Platelets: 280 10*3/uL (ref 150–400)
RBC: 4.61 MIL/uL (ref 3.87–5.11)
RDW: 13.2 % (ref 11.5–15.5)
WBC: 9 10*3/uL (ref 4.0–10.5)
nRBC: 0 % (ref 0.0–0.2)

## 2022-02-24 LAB — BASIC METABOLIC PANEL
Anion gap: 9 (ref 5–15)
BUN: 11 mg/dL (ref 8–23)
CO2: 26 mmol/L (ref 22–32)
Calcium: 9.3 mg/dL (ref 8.9–10.3)
Chloride: 105 mmol/L (ref 98–111)
Creatinine, Ser: 0.87 mg/dL (ref 0.44–1.00)
GFR, Estimated: >60 mL/min (ref >60)
Glucose, Bld: 105 mg/dL — ABNORMAL HIGH (ref 70–99)
Potassium: 4.1 mmol/L (ref 3.5–5.1)
Sodium: 140 mmol/L (ref 135–145)

## 2022-02-24 LAB — SARS CORONAVIRUS 2 BY RT PCR: SARS Coronavirus 2 by RT PCR: NEGATIVE

## 2022-02-24 SURGERY — BRONCHOSCOPY, WITH BIOPSY USING ELECTROMAGNETIC NAVIGATION
Anesthesia: General

## 2022-02-24 MED ORDER — FENTANYL CITRATE (PF) 100 MCG/2ML IJ SOLN
INTRAMUSCULAR | Status: DC | PRN
  Administered 2022-02-24 (×2): 50 ug via INTRAVENOUS

## 2022-02-24 MED ORDER — ROCURONIUM BROMIDE 10 MG/ML (PF) SYRINGE
PREFILLED_SYRINGE | INTRAVENOUS | Status: DC | PRN
  Administered 2022-02-24: 20 mg via INTRAVENOUS
  Administered 2022-02-24: 50 mg via INTRAVENOUS

## 2022-02-24 MED ORDER — CHLORHEXIDINE GLUCONATE 0.12 % MT SOLN
15.0000 mL | Freq: Once | OROMUCOSAL | Status: AC
Start: 2022-02-24 — End: 2022-02-24
  Administered 2022-02-24: 15 mL via OROMUCOSAL
  Filled 2022-02-24 (×2): qty 15

## 2022-02-24 MED ORDER — PROPOFOL 500 MG/50ML IV EMUL
INTRAVENOUS | Status: DC | PRN
  Administered 2022-02-24: 150 ug/kg/min via INTRAVENOUS

## 2022-02-24 MED ORDER — DEXAMETHASONE SODIUM PHOSPHATE 4 MG/ML IJ SOLN
INTRAMUSCULAR | Status: DC | PRN
  Administered 2022-02-24: 5 mg via INTRAVENOUS

## 2022-02-24 MED ORDER — LACTATED RINGERS IV SOLN: INTRAVENOUS | Status: DC

## 2022-02-24 MED ORDER — ONDANSETRON HCL 4 MG/2ML IJ SOLN
INTRAMUSCULAR | Status: DC | PRN
  Administered 2022-02-24: 4 mg via INTRAVENOUS

## 2022-02-24 MED ORDER — SUGAMMADEX SODIUM 200 MG/2ML IV SOLN
INTRAVENOUS | Status: DC | PRN
  Administered 2022-02-24: 100 mg via INTRAVENOUS

## 2022-02-24 MED ORDER — PROPOFOL 10 MG/ML IV BOLUS
INTRAVENOUS | Status: DC | PRN
  Administered 2022-02-24: 100 mg via INTRAVENOUS

## 2022-02-24 MED ORDER — LIDOCAINE 2% (20 MG/ML) 5 ML SYRINGE
INTRAMUSCULAR | Status: DC | PRN
  Administered 2022-02-24: 40 mg via INTRAVENOUS

## 2022-02-24 MED ORDER — ESMOLOL HCL 100 MG/10ML IV SOLN
INTRAVENOUS | Status: DC | PRN
  Administered 2022-02-24: 10 mg via INTRAVENOUS

## 2022-02-24 MED ORDER — PHENYLEPHRINE 80 MCG/ML (10ML) SYRINGE FOR IV PUSH (FOR BLOOD PRESSURE SUPPORT)
PREFILLED_SYRINGE | INTRAVENOUS | Status: DC | PRN
  Administered 2022-02-24: 80 ug via INTRAVENOUS
  Administered 2022-02-24: 160 ug via INTRAVENOUS
  Administered 2022-02-24 (×4): 80 ug via INTRAVENOUS

## 2022-02-24 SURGICAL SUPPLY — 1 items: super lock fiducial marker IMPLANT

## 2022-02-24 NOTE — Interval H&P Note (Signed)
History and Physical Interval Note:  02/24/2022 7:25 AM  Jessica Gilbert  has presented today for surgery, with the diagnosis of PULMONARY NODULES LEFT.  The various methods of treatment have been discussed with the patient and family. After consideration of risks, benefits and other options for treatment, the patient has consented to  Procedure(s): ROBOTIC ASSISTED NAVIGATIONAL BRONCHOSCOPY (N/A) as a surgical intervention.  The patient's history has been reviewed, patient examined, no change in status, stable for surgery.  I have reviewed the patient's chart and labs.  Questions were answered to the patient's satisfaction.     Collene Gobble

## 2022-02-24 NOTE — Anesthesia Procedure Notes (Signed)
Procedure Name: Intubation Date/Time: 02/24/2022 7:45 AM  Performed by: Eulas Post, Rodarius Kichline W, CRNAPre-anesthesia Checklist: Patient identified, Emergency Drugs available, Suction available and Patient being monitored Patient Re-evaluated:Patient Re-evaluated prior to induction Oxygen Delivery Method: Circle system utilized Preoxygenation: Pre-oxygenation with 100% oxygen Induction Type: IV induction Ventilation: Mask ventilation without difficulty Laryngoscope Size: Miller and 2 Grade View: Grade II Tube type: Oral Tube size: 8.5 mm Number of attempts: 2 Airway Equipment and Method: Stylet and Bougie stylet Placement Confirmation: ETT inserted through vocal cords under direct vision, positive ETCO2 and breath sounds checked- equal and bilateral Secured at: 20 cm Tube secured with: Tape Dental Injury: Teeth and Oropharynx as per pre-operative assessment

## 2022-02-24 NOTE — Transfer of Care (Signed)
Immediate Anesthesia Transfer of Care Note  Patient: Jessica Gilbert  Procedure(s) Performed: ROBOTIC ASSISTED NAVIGATIONAL BRONCHOSCOPY BRONCHIAL NEEDLE ASPIRATION BIOPSIES VIDEO BRONCHOSCOPY WITH RADIAL ENDOBRONCHIAL ULTRASOUND BRONCHIAL BRUSHINGS BRONCHIAL BIOPSIES FIDUCIAL MARKER PLACEMENT BRONCHIAL WASHINGS  Patient Location: PACU  Anesthesia Type:General  Level of Consciousness: drowsy  Airway & Oxygen Therapy: Patient Spontanous Breathing and Patient connected to face mask oxygen  Post-op Assessment: Report given to RN and Post -op Vital signs reviewed and stable  Post vital signs: Reviewed and stable  Last Vitals:  Vitals Value Taken Time  BP 125/52 02/24/22 0932  Temp 36.6 C 02/24/22 0932  Pulse 88 02/24/22 0935  Resp 19 02/24/22 0935  SpO2 96 % 02/24/22 0935  Vitals shown include unvalidated device data.  Last Pain:  Vitals:   02/24/22 0629  TempSrc:   PainSc: 0-No pain      Patients Stated Pain Goal: 0 (92/41/55 1614)  Complications: No notable events documented.

## 2022-02-24 NOTE — Anesthesia Postprocedure Evaluation (Signed)
Anesthesia Post Note  Patient: Jessica Gilbert  Procedure(s) Performed: ROBOTIC ASSISTED NAVIGATIONAL BRONCHOSCOPY BRONCHIAL NEEDLE ASPIRATION BIOPSIES VIDEO BRONCHOSCOPY WITH RADIAL ENDOBRONCHIAL ULTRASOUND BRONCHIAL BRUSHINGS BRONCHIAL BIOPSIES FIDUCIAL MARKER PLACEMENT BRONCHIAL WASHINGS     Patient location during evaluation: PACU Anesthesia Type: General Level of consciousness: awake and alert Pain management: pain level controlled Vital Signs Assessment: post-procedure vital signs reviewed and stable Respiratory status: spontaneous breathing, nonlabored ventilation, respiratory function stable and patient connected to nasal cannula oxygen Cardiovascular status: blood pressure returned to baseline and stable Postop Assessment: no apparent nausea or vomiting Anesthetic complications: no   No notable events documented.  Last Vitals:  Vitals:   02/24/22 1017 02/24/22 1032  BP: (!) 118/54 111/61  Pulse: 78 73  Resp: 15 12  Temp:  36.7 C  SpO2: 95% 96%    Last Pain:  Vitals:   02/24/22 0932  TempSrc:   PainSc: 0-No pain                 [name] [name]

## 2022-02-24 NOTE — Op Note (Signed)
Video Bronchoscopy with Robotic Assisted Bronchoscopic Navigation   Date of Operation: 02/24/2022   Pre-op Diagnosis: Left upper lobe pulmonary nodules  Post-op Diagnosis: Same  Surgeon: Baltazar Apo  Assistants: None  Anesthesia: General endotracheal anesthesia  Operation: Flexible video fiberoptic bronchoscopy with robotic assistance and biopsies.  Estimated Blood Loss: Minimal  Complications: None  Indications and History: Jessica Gilbert is a 76 y.o. female with history of tobacco use, remote non-small cell lung cancer (right upper lobe lobectomy) and breast cancer.  She is noted to have enlarging left upper lobe pulmonary nodule disease.  Recommendation made to achieve a tissue diagnosis via navigational bronchoscopy with biopsies. The risks, benefits, complications, treatment options and expected outcomes were discussed with the patient.  The possibilities of pneumothorax, pneumonia, reaction to medication, pulmonary aspiration, perforation of a viscus, bleeding, failure to diagnose a condition and creating a complication requiring transfusion or operation were discussed with the patient who freely signed the consent.    Description of Procedure: The patient was seen in the Preoperative Area, was examined and was deemed appropriate to proceed.  The patient was taken to [facility] endoscopy room 3, identified as Jessica Gilbert and the procedure verified as Flexible Video Fiberoptic Bronchoscopy.  A Time Out was held and the above information confirmed.   Prior to the date of the procedure a high-resolution CT scan of the chest was performed. Utilizing ION software program a virtual tracheobronchial tree was generated to allow the creation of distinct navigation pathways to the patient's parenchymal abnormalities. After being taken to the operating room general anesthesia was initiated and the patient  was orally intubated. The video fiberoptic bronchoscope was introduced via the endotracheal  tube and a general inspection was performed which showed right upper lobe stump and suture line, otherwise normal right and left lung anatomy. Aspiration of the bilateral mainstems was completed to remove any remaining secretions. Robotic catheter inserted into patient's endotracheal tube.   Target #1 lingular nodule: The distinct navigation pathways prepared prior to this procedure were then utilized to navigate to patient's lesion identified on CT scan. The robotic catheter was secured into place and the vision probe was withdrawn.  Lesion location was approximated using fluoroscopy and radial endobronchial ultrasound for peripheral targeting.  Local registration and targeting was performed using Cios three-dimensional imaging.  Under fluoroscopic guidance transbronchial needle brushings, transbronchial needle biopsies, and transbronchial forceps biopsies were performed to be sent for cytology and pathology.  Under fluoroscopic guidance a single fiducial marker was placed adjacent to the nodule.  Target #2 left upper lobe apical nodule: The distinct navigation pathways prepared prior to this procedure were then utilized to navigate to patient's lesion identified on CT scan. The robotic catheter was secured into place and the vision probe was withdrawn.  Lesion location was approximated using fluoroscopy and radial endobronchial ultrasound for peripheral targeting.  Local registration and targeting was performed using Cios three-dimensional imaging.  Under fluoroscopic guidance transbronchial needle brushings, transbronchial needle biopsies, and transbronchial forceps biopsies were performed to be sent for cytology and pathology. A bronchioalveolar lavage was performed in the left upper lobe and sent for microbiology.  A single fiducial marker was placed under fluoroscopic guidance adjacent to the nodule.  At the end of the procedure a general airway inspection was performed and there was no evidence of  active bleeding. The bronchoscope was removed.  The patient tolerated the procedure well. There was no significant blood loss and there were no obvious complications. A post-procedural  chest x-ray is pending.  Samples Target #1: 1. Transbronchial needle brushings from the lingular nodule 2. Transbronchial Wang needle biopsies from lingular nodule 3. Transbronchial forceps biopsies from lingular nodule  Samples Target #2: 1. Transbronchial needle brushings from apical left upper lobe nodule 2. Transbronchial Wang needle biopsies from apical left upper lobe nodule 3. Transbronchial forceps biopsies from apical left upper lobe nodule 4. Bronchoalveolar lavage from left upper lobe  Plans:  The patient will be discharged from the PACU to home when recovered from anesthesia and after chest x-ray is reviewed. We will review the cytology, pathology and microbiology results with the patient when they become available. Outpatient followup will be with Dr Lamonte Sakai and Dr Chase Caller.   Baltazar Apo, MD, PhD 02/24/2022, 9:30 AM [facility] [phone] or if no answer before 7:00PM call [phone] For any issues after 7:00PM please call eLink [phone]

## 2022-02-24 NOTE — Discharge Instructions (Signed)
Flexible Bronchoscopy, Care After This sheet gives you information about how to care for yourself after your test. Your doctor may also give you more specific instructions. If you have problems or questions, contact your doctor. Follow these instructions at home: Eating and drinking When your numbness is gone and your cough and gag reflexes have come back, you may: Eat only soft foods. Slowly drink liquids. The day after the test, go back to your normal diet. Driving Do not drive for 24 hours if you were given a medicine to help you relax (sedative). Do not drive or use heavy machinery while taking prescription pain medicine. General instructions  Take over-the-counter and prescription medicines only as told by your doctor. Return to your normal activities as told. Ask what activities are safe for you. Do not use any products that have nicotine or tobacco in them. This includes cigarettes and e-cigarettes. If you need help quitting, ask your doctor. Keep all follow-up visits as told by your doctor. This is important. It is very important if you had a tissue sample (biopsy) taken. Get help right away if: You have shortness of breath that gets worse. You get light-headed. You feel like you are going to pass out (faint). You have chest pain. You cough up: More than a little blood. More blood than before. Summary Do not eat or drink anything (not even water) for 2 hours after your test, or until your numbing medicine wears off. Do not use cigarettes. Do not use e-cigarettes. Get help right away if you have chest pain.  Please call our office for any questions or concerns.  [phone].  This information is not intended to replace advice given to you by your health care provider. Make sure you discuss any questions you have with your health care provider. Document Released: 03/30/2009 Document Revised: 05/15/2017 Document Reviewed: 06/20/2016 Elsevier Patient Education  2020 [employer]  

## 2022-02-25 LAB — ACID FAST SMEAR (AFB, MYCOBACTERIA): Acid Fast Smear: NEGATIVE

## 2022-02-26 LAB — CULTURE, BAL-QUANTITATIVE W GRAM STAIN
Culture: 20000 — AB
Gram Stain: NONE SEEN

## 2022-02-27 ENCOUNTER — Telehealth: Payer: Self-pay | Admitting: Emergency Medicine

## 2022-02-27 DIAGNOSIS — C3492 Malignant neoplasm of unspecified part of left bronchus or lung: Secondary | ICD-10-CM

## 2022-02-27 LAB — FUNGUS STAIN

## 2022-02-27 LAB — FUNGAL STAIN REFLEX

## 2022-02-27 LAB — CYTOLOGY - NON PAP

## 2022-02-27 NOTE — Telephone Encounter (Signed)
Discussed bronchoscopy results with the patient.  Her hypermetabolic lingular nodule was positive for non-small cell lung cancer.  The left apical nodule was negative (also negative on PET scan).  Unfortunately there was no material in the cellblock to send for ancillary stains.  I think she is a good candidate for SBRT.  I will refer her to radiation oncology.

## 2022-02-27 NOTE — Telephone Encounter (Signed)
Bronchoscopy cytology results are still pending. Will call her when resulted.

## 2022-03-02 LAB — AEROBIC/ANAEROBIC CULTURE W GRAM STAIN (SURGICAL/DEEP WOUND)
Culture: NORMAL
Gram Stain: NONE SEEN

## 2022-03-05 ENCOUNTER — Encounter: Payer: Self-pay | Admitting: Emergency Medicine

## 2022-03-05 ENCOUNTER — Ambulatory Visit: Payer: Medicare HMO | Admitting: Emergency Medicine

## 2022-03-05 DIAGNOSIS — R918 Other nonspecific abnormal finding of lung field: Secondary | ICD-10-CM | POA: Diagnosis not present

## 2022-03-05 NOTE — Patient Instructions (Signed)
We reviewed your biopsy results today. We discussed your pulmonary function testing.  I believe that your underlying lung disease and your history of a right upper lobe lobectomy make you a poor candidate for resection of your new pulmonary nodule. Follow with Dr. Sondra Come as planned on 9/21 to discuss radiation treatment to your non-small cell lung cancer found in the left upper lobe (lingula).  Follow with Dr. Chase Caller as planned.

## 2022-03-05 NOTE — Assessment & Plan Note (Signed)
The hypermetabolic enlarging lingular nodule was positive for non-small cell lung cancer.  The more apical left upper lobe nodule that was not hypermetabolic was negative.  Unfortunately the cellblock was acellular so no ancillary studies were done.  I do believe we have a certain diagnosis of non-small cell lung cancer in the lingula.  She asked me if she could have a primary resection but I do not think she is a candidate.  Her FEV1 on 08/16/2021 was less than 1 L.  She did have some side effects from radiation in the past and wants to clarify with Dr. Sondra Come with potential side effects may be.  I believe ultimately she should proceed with SBRT.  She will then need serial follow-up imaging as per radiation oncology plans.  We reviewed your biopsy results today. We discussed your pulmonary function testing.  I believe that your underlying lung disease and your history of a right upper lobe lobectomy make you a poor candidate for resection of your new pulmonary nodule. Follow with Dr. Sondra Come as planned on 9/21 to discuss radiation treatment to your non-small cell lung cancer found in the left upper lobe (lingula).

## 2022-03-05 NOTE — Progress Notes (Signed)
Thoracic Location of Tumor / Histology: nodule of the posterior lingula - non-small cell lung cancer.  Patient presented {numbers 1-12:19994} months ago with symptoms of: ***  Biopsies revealed:   02/24/2022 FINAL MICROSCOPIC DIAGNOSIS:   A. LUNG, LINGULA - TARGET 1, FINE NEEDLE ASPIRATION:  - Malignant cells consistent with non-small cell carcinoma, See comment   B. LUNG, LINGULA - TARGET 1, BRUSHING:  - Atypical cells present   C. LUNG, LUL - TARGET 2, FINE NEEDLE ASPIRATION:  - Atypical cells present   D. LUNG, LUL - TARGET 2, BRUSHING:  - Nondiagnostic material   Tobacco/Marijuana/Snuff/ETOH use: smoked 1 PPD for 35 years and quit in 2005.  Past/Anticipated interventions by cardiothoracic surgery, if any: {:18581}  Past/Anticipated interventions by medical oncology, if any: {:18581}  Signs/Symptoms Weight changes, if any: {:18581} Respiratory complaints, if any: {:18581} Hemoptysis, if any: {:18581} Pain issues, if any:  {:18581}  SAFETY ISSUES: Prior radiation? {:18581} Pacemaker/ICD? {:18581}  Possible current pregnancy?no Is the patient on methotrexate? {:18581}  Current Complaints / other details:  Dr. Lamonte Sakai is recommending SBRT.

## 2022-03-05 NOTE — Progress Notes (Signed)
Subjective:    Patient ID: Jessica Gilbert, female    DOB: 1945/10/27, 76 y.o.   MRN: [id number]  HPI 76 year old former smoker (35 pack years) with a history of breast cancer, celiac disease, GERD with a hiatal hernia, hypertension with hyperlipidemia, TIA.  She had a right upper lobe lobectomy in 2005 for non-small cell lung cancer and has been in remission.  She is referred today for slowly enlarging pulmonary nodules that have been followed by PET scan and CT scan of the chest most recently 10/17/2021.  PET scan 07/22/2021 to evaluate left lung nodule showed no evidence of hypermetabolic lymphadenopathy there was mild hypermetabolism and a 13 mm posterior left upper lobe pulmonary nodule.  Other smaller left apical nodules were not PET avid.  A lingular nodule increased in size a 9 mm from 3 mm  CT chest 10/17/2021 reviewed by me showed bullous centrilobular emphysema, evidence of her right upper lobe lobectomy, cavitary apical left upper lobe pulmonary nodule 1.0 x 1.6 cm.  There is a spiculated lingular nodule that is slightly larger at 11 x 13 mm.  Other upper lobe nodules are stable in size   ROV 03/05/22 --Jessica Gilbert is 80, former smoker with a history of breast cancer.  She has also had a right upper lobe non-small cell lung cancer status post lobectomy 2005.  I saw her for slowly enlarging pulmonary nodular disease including a cavitary apical left upper lobe nodule, spiculated lingular nodule.  The lingular nodule had increased in size and was hypermetabolic on PET scan.  She underwent navigational bronchoscopy on 02/24/2022.  The lingular nodule was consistent with non-small cell lung cancer, the more apical nodule was negative (also negative on PET scan).  Unfortunately the cellblock was not cellular and ancillary studies could not be performed.  She has appointment to see Dr. Sondra Come tomorrow. She had a lot of sore throat, was coughing w some hemoptysis - now resolved. She asks today if she is a  candidate for resection - I believe her PFT preclude this.     Review of Systems As per HPI  Past Medical History:  Diagnosis Date   Acute respiratory failure ([place])    Breast cancer ([place])    Carotid stenosis, right    Celiac disease    Dyspnea    Emphysema lung ([place])    Factor 5 Leiden mutation, heterozygous ([place])    States no symptoms   History of hiatal hernia    Human metapneumovirus pneumonia 08/2013   Hyperlipidemia    Hypertension    Lung cancer ([place]) 2005   Rt upper lobe   Stroke ([place])    TIA w/o defecits     Family History  Problem Relation Age of Onset   Heart disease Father    High blood pressure Father    Congestive Heart Failure Father    Leukemia Mother    Colon polyps Mother    High blood pressure Brother    Lung cancer Brother    Heart disease Brother    Diabetes Brother    Colon polyps Sister    Breast cancer Sister    High blood pressure Sister    Emphysema Sister    Pancreatic cancer Paternal Uncle      Social History   Socioeconomic History   Marital status: Divorced    Spouse name: Not on file   Number of children: Not on file   Years of education: Not on file   Highest education  level: Not on file  Occupational History   Not on file  Tobacco Use   Smoking status: Former    Packs/day: 1.00    Years: 35.00    Total pack years: 35.00    Types: Cigarettes    Quit date: 01/29/2004    Years since quitting: 18.1   Smokeless tobacco: Never  Vaping Use   Vaping Use: Never used  Substance and Sexual Activity   Alcohol use: Not Currently   Drug use: No   Sexual activity: Not on file  Other Topics Concern   Not on file  Social History Narrative   Not on file   Social Determinants of Health   Financial Resource Strain: Not on file  Food Insecurity: Not on file  Transportation Needs: Not on file  Physical Activity: Not on file  Stress: Not on file  Social Connections: Not on file  Intimate Partner Violence: Not on file    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  No inhaled exposures to her knowledge    Allergies  Allergen Reactions   Aspirin Other (See Comments)    "burning stomach"   Codeine Nausea Only   Vitamin D Analogs Other (See Comments)    Optical migraines      Outpatient Medications Prior to Visit  Medication Sig Dispense Refill   diphenhydrAMINE (BENADRYL) 25 MG tablet Take 25 mg by mouth at bedtime.     fluticasone (FLONASE SENSIMIST) 27.5 MCG/SPRAY nasal spray Place 1 spray into the nose daily.     guaiFENesin (MUCINEX) 600 MG 12 hr tablet Take 600 mg by mouth 2 (two) times daily as needed for to loosen phlegm.     ibuprofen (ADVIL,MOTRIN) 200 MG tablet Take 200 mg by mouth every 6 (six) hours as needed for moderate pain.     mometasone-formoterol (DULERA) 200-5 MCG/ACT AERO Inhale 2 puffs into the lungs 2 (two) times daily. 1 Inhaler 5   Multiple Vitamin (MULTIVITAMIN WITH MINERALS) TABS tablet Take 1 tablet by mouth daily.     telmisartan (MICARDIS) 40 MG tablet Take 40 mg by mouth daily.     tetrahydrozoline-zinc (VISINE-AC) 0.05-0.25 % ophthalmic solution Place 1 drop into both eyes 3 (three) times daily as needed (dry eyes).     albuterol (VENTOLIN HFA) 108 (90 Base) MCG/ACT inhaler Inhale 2 puffs into the lungs every 4 (four) hours as needed (Wheezing/SOB).     No facility-administered medications prior to visit.        Objective:   Physical Exam  Vitals:   03/05/22 1145  BP: 120/68  Pulse: 85  Temp: 97.6 F (36.4 C)  TempSrc: Oral  SpO2: 97%  Weight: 106 lb 6.4 oz (48.3 kg)  Height: 5' 4"  (1.626 m)   Gen: Pleasant, thin, in no distress,  normal affect  ENT: No lesions,  mouth clear,  oropharynx clear, no postnasal drip  Neck: No JVD, no stridor  Lungs: No use of accessory muscles, no crackles or wheezing on normal respiration, no wheeze on forced expiration  Cardiovascular: RRR, heart sounds normal, no murmur  or gallops, no peripheral edema  Musculoskeletal: No deformities, no cyanosis or clubbing  Neuro: alert, awake, non focal  Skin: Warm, no lesions or rash     Assessment & Plan:  Pulmonary nodules The hypermetabolic enlarging lingular nodule was positive for non-small cell lung cancer.  The more apical left upper lobe nodule that was  not hypermetabolic was negative.  Unfortunately the cellblock was acellular so no ancillary studies were done.  I do believe we have a certain diagnosis of non-small cell lung cancer in the lingula.  She asked me if she could have a primary resection but I do not think she is a candidate.  Her FEV1 on 08/16/2021 was less than 1 L.  She did have some side effects from radiation in the past and wants to clarify with Dr. Sondra Come with potential side effects may be.  I believe ultimately she should proceed with SBRT.  She will then need serial follow-up imaging as per radiation oncology plans.  We reviewed your biopsy results today. We discussed your pulmonary function testing.  I believe that your underlying lung disease and your history of a right upper lobe lobectomy make you a poor candidate for resection of your new pulmonary nodule. Follow with Dr. Sondra Come as planned on 9/21 to discuss radiation treatment to your non-small cell lung cancer found in the left upper lobe (lingula).    Baltazar Apo, MD, PhD 03/05/2022, 12:08 PM [facility] [phone] or if no answer before 7:00PM call [phone] For any issues after 7:00PM please call eLink [phone]

## 2022-03-06 ENCOUNTER — Ambulatory Visit
Admission: RE | Admit: 2022-03-06 | Discharge: 2022-03-06 | Disposition: A | Discharge status: Home / Self Care | Payer: Medicare HMO | Source: Ambulatory Visit | Attending: Radiation Oncology | Admitting: Radiation Oncology

## 2022-03-06 ENCOUNTER — Other Ambulatory Visit: Payer: Self-pay | Admitting: *Deleted

## 2022-03-06 ENCOUNTER — Encounter: Payer: Self-pay | Admitting: Radiation Oncology

## 2022-03-06 VITALS — BP 137/93 | HR 94 | Temp 97.6°F | Resp 20 | Ht 64.0 in | Wt 108.0 lb

## 2022-03-06 DIAGNOSIS — Z803 Family history of malignant neoplasm of breast: Secondary | ICD-10-CM | POA: Diagnosis not present

## 2022-03-06 DIAGNOSIS — I10 Essential (primary) hypertension: Secondary | ICD-10-CM | POA: Insufficient documentation

## 2022-03-06 DIAGNOSIS — Z85118 Personal history of other malignant neoplasm of bronchus and lung: Secondary | ICD-10-CM | POA: Diagnosis not present

## 2022-03-06 DIAGNOSIS — C3412 Malignant neoplasm of upper lobe, left bronchus or lung: Secondary | ICD-10-CM | POA: Diagnosis not present

## 2022-03-06 DIAGNOSIS — Z79899 Other long term (current) drug therapy: Secondary | ICD-10-CM | POA: Diagnosis not present

## 2022-03-06 DIAGNOSIS — E785 Hyperlipidemia, unspecified: Secondary | ICD-10-CM | POA: Diagnosis not present

## 2022-03-06 DIAGNOSIS — C3410 Malignant neoplasm of upper lobe, unspecified bronchus or lung: Secondary | ICD-10-CM

## 2022-03-06 DIAGNOSIS — Z801 Family history of malignant neoplasm of trachea, bronchus and lung: Secondary | ICD-10-CM | POA: Insufficient documentation

## 2022-03-06 DIAGNOSIS — Z923 Personal history of irradiation: Secondary | ICD-10-CM | POA: Insufficient documentation

## 2022-03-06 DIAGNOSIS — Z7951 Long term (current) use of inhaled steroids: Secondary | ICD-10-CM | POA: Insufficient documentation

## 2022-03-06 DIAGNOSIS — Z87891 Personal history of nicotine dependence: Secondary | ICD-10-CM | POA: Diagnosis not present

## 2022-03-06 DIAGNOSIS — Z853 Personal history of malignant neoplasm of breast: Secondary | ICD-10-CM | POA: Insufficient documentation

## 2022-03-06 DIAGNOSIS — D6851 Activated protein C resistance: Secondary | ICD-10-CM | POA: Diagnosis not present

## 2022-03-06 DIAGNOSIS — J439 Emphysema, unspecified: Secondary | ICD-10-CM | POA: Insufficient documentation

## 2022-03-06 DIAGNOSIS — Z8673 Personal history of transient ischemic attack (TIA), and cerebral infarction without residual deficits: Secondary | ICD-10-CM | POA: Insufficient documentation

## 2022-03-06 NOTE — Progress Notes (Signed)
Radiation Oncology         ([phone]) [phone] ________________________________  Initial Outpatient Consultation  Name: Jessica Gilbert MRN: [id number]  Date: 03/06/2022  DOB: [birth year]  [place]:[id number], Colletta [place], NP  Collene Gobble, MD   REFERRING PHYSICIAN: Collene Gobble, MD  DIAGNOSIS: The encounter diagnosis was Cancer of lingula of lung ([place]).  Non-small cell carcinoma of the lingula - nodule of the posterior lingula, Stage IA2 (cT1b, cN0, cM0)  History of non-small cell carcinoma of the RUL s/p lobectomy in 2005, and history of recurrent breast cancer s/p lumpectomy   HISTORY OF PRESENT ILLNESS::Jessica Gilbert is a 76 y.o. female who is seen as a courtesy of Dr. Lamonte Sakai for an opinion concerning radiation therapy as part of management for her recently diagnosed left sided pulmonary nodule. The patient has a history significant for breast cancer diagnosed in 2017 with recurrence in 2021, and right upper lobe non-small cell lung cancer s/p lobectomy in 2005. She has been in remission and observation since. At baseline, the patient has significant dyspnea on exertion which had been stable until her recent history detailed below.   Towards the end of 2022, the patient presented to Dr. Jana Hakim with a "variation" in her SOB and worsening sinus drainage. This prompted a chest CT on 05/08/2021 which showed multiple new nonspecific nodular areas of architectural distortion in the left upper lobe near the apex, largest of which displaying some internal cavitation. These areas were noted to likely be infectious or inflammatory etiology at the time, however, underlying neoplasm could not be entirely excluded. CT also showed diffuse bronchial wall thickening with severe centrilobular and paraseptal emphysema.   PET scan on 07/22/21 showed persistent indeterminate pulmonary nodules in left lung apex, with a dominant 13 mm cavitary nodule showing mild hypermetabolic activity. PET also revealed an increase  in size of a 9 mm hypermetabolic pulmonary nodule in lingula. Otherwise, PET showed no evidence of malignancy within the neck, abdomen, or pelvis.  Subsequently, the patient presented to pulmonology for follow up on 08/16/21. During which time, PFT's performed that day were reviewed which endorsed severe obstruction, and reduced DLCO.   Chest CT on 10/17/21 showed enlargement of the previously seen hypermetabolic spiculated lingular nodule, concerning for bronchogenic carcinoma, and a new subpleural consolidation in the left lower lobe, likely infectious/inflammatory in etiology. Other apical left upper lobe nodules otherwise appeared stable.   Super D chest CT on 01/30/22 showed similar appearance of the partially cavitary, spiculated nodule of the posterior lingula measuring 1.2 x 1.2 cm. This was previously FDG avid and remained highly concerning for a small malignancy. CT also showed multiple unchanged nonspecific additional nodules of the left upper lobe, largest in the posterior apex measuring 1.5 x 1.0 cm; near complete resolution of a previously seen infectious or inflammatory subpleural nodular opacity or consolidation of the\ dependent left lower lobe; and a new irregular nodular opacity of the anterior peripheral left lower lobe measuring 2.3 x 1.2 cm.  Given persistence of the lingular nodule and LUL nodule, the patient underwent bronchoscopy with biopsies on 02/24/22 under the care of Dr. Lamonte Sakai. FNA of the lingula revealed malignant cells consistent with non-small cell carcinoma. FNA of the LUL also revealed atypical cells present.   During a follow up with Dr. Lamonte Sakai yesterday, the patient endorsed sore throat and some intermittent coughing with some hemoptysis. Given her PFT's, underlying lung disease, and history of right upper lobectomy, the patient was informed that she is not a candidate  for resection but that she would likely benefit from SBRT.  Her FEV1 in March of this year was  0.67.  This was 32% of predicted    PREVIOUS RADIATION THERAPY: She reports receiving preoperative patient therapy to the right chest prior to her lung cancer surgery.  She reports receiving approximately 5 to 6 weeks of radiation therapy.  Details pending at this time.  PAST MEDICAL HISTORY:  Past Medical History:  Diagnosis Date   Acute respiratory failure ([facility])    Breast cancer ([place])    left breast   Carotid stenosis, right    Celiac disease    Dyspnea    Emphysema lung ([facility])    Factor 5 Leiden mutation, heterozygous ([place])    States no symptoms   History of hiatal hernia    Human metapneumovirus pneumonia 08/2013   Hyperlipidemia    Hypertension    Lung cancer ([place]) 2005   Rt upper lobe   Stroke ([place])    TIA w/o defecits    PAST SURGICAL HISTORY: Past Surgical History:  Procedure Laterality Date   BREAST LUMPECTOMY     left    BREAST LUMPECTOMY WITH RADIOACTIVE SEED AND SENTINEL LYMPH NODE BIOPSY Left 07/17/2015   Procedure: LEFT BREAST LUMPECTOMY WITH RADIOACTIVE SEED WITH LEFT AXILLARY SENTINEL LYMPH NODE BIOPSY;  Surgeon: [name], MD;  Location: [facility];  Service: General;  Laterality: Left;   BREAST LUMPECTOMY WITH RADIOACTIVE SEED AND SENTINEL LYMPH NODE BIOPSY Left 05/28/2020   Procedure: LEFT BREAST LUMPECTOMY WITH RADIOACTIVE SEED AND SENTINEL LYMPH NODE BIOPSY;  Surgeon: Jovita Kussmaul, MD;  Location: [facility];  Service: General;  Laterality: Left;   BRONCHIAL BIOPSY  02/24/2022   Procedure: BRONCHIAL BIOPSIES;  Surgeon: Collene Gobble, MD;  Location[facility] ENDOSCOPY;  Service: Pulmonary;;   BRONCHIAL BRUSHINGS  02/24/2022   Procedure: BRONCHIAL BRUSHINGS;  Surgeon: Collene Gobble, MD;  Location[facility] ENDOSCOPY;  Service: Pulmonary;;   BRONCHIAL NEEDLE ASPIRATION BIOPSY  02/24/2022   Procedure: BRONCHIAL NEEDLE ASPIRATION BIOPSIES;  Surgeon: Collene Gobble, MD;  Location[facility] END[facility]COPY;  Service: Pulmonary;;   BRONCHIAL WASHINGS   02/24/2022   Procedure: BRONCHIAL WASHINGS;  Surgeon: Collene Gobble, MD;  Location: [facility] ENDOSCOPY;  Service: Pulmonary;;   FIDUCIAL MARKER PLACEMENT  02/24/2022   Procedure: FIDUCIAL MARKER PLACEMENT;  Surgeon: Collene Gobble, MD;  Location: [facility] ENDOSCOPY;  Service: Pulmonary;;   LUNG REMOVAL, PARTIAL Right 2005   Upper lobe   TRIGGER FINGER RELEASE     TUBAL LIGATION  1980   VIDEO BRONCHOSCOPY WITH RADIAL ENDOBRONCHIAL ULTRASOUND  02/24/2022   Procedure: VIDEO BRONCHOSCOPY WITH RADIAL ENDOBRONCHIAL ULTRASOUND;  Surgeon: Collene Gobble, MD;  Location: [facility] ENDOSCOPY;  Service: Pulmonary;;    FAMILY HISTORY:  Family History  Problem Relation Age of Onset   Heart disease Father    High blood pressure Father    Congestive Heart Failure Father    Leukemia Mother    Colon polyps Mother    High blood pressure Brother    Lung cancer Brother    Heart disease Brother    Diabetes Brother    Colon polyps Sister    Breast cancer Sister    High blood pressure Sister    Emphysema Sister    Pancreatic cancer Paternal Uncle     SOCIAL HISTORY:  Social History   Tobacco Use   Smoking status: Former    Packs/day: 1.00    Years: 35.00  Total pack years: 35.00    Types: Cigarettes    Quit date: 01/29/2004    Years since quitting: 18.1   Smokeless tobacco: Never  Vaping Use   Vaping Use: Never used  Substance Use Topics   Alcohol use: Not Currently   Drug use: No    ALLERGIES:  Allergies  Allergen Reactions   Aspirin Other (See Comments)    "burning stomach"   Codeine Nausea Only   Vitamin D Analogs Other (See Comments)    Optical migraines     MEDICATIONS:  Current Outpatient Medications  Medication Sig Dispense Refill   albuterol (VENTOLIN HFA) 108 (90 Base) MCG/ACT inhaler Inhale 2 puffs into the lungs every 4 (four) hours as needed (Wheezing/SOB).     diphenhydrAMINE (BENADRYL) 25 MG tablet Take 25 mg by mouth at bedtime.     fluticasone (FLONASE SENSIMIST) 27.5  MCG/SPRAY nasal spray Place 1 spray into the nose daily.     guaiFENesin (MUCINEX) 600 MG 12 hr tablet Take 600 mg by mouth 2 (two) times daily as needed for to loosen phlegm.     ibuprofen (ADVIL,MOTRIN) 200 MG tablet Take 200 mg by mouth every 6 (six) hours as needed for moderate pain.     mometasone-formoterol (DULERA) 200-5 MCG/ACT AERO Inhale 2 puffs into the lungs 2 (two) times daily. 1 Inhaler 5   Multiple Vitamin (MULTIVITAMIN WITH MINERALS) TABS tablet Take 1 tablet by mouth daily.     telmisartan (MICARDIS) 40 MG tablet Take 40 mg by mouth daily.     tetrahydrozoline-zinc (VISINE-AC) 0.05-0.25 % ophthalmic solution Place 1 drop into both eyes 3 (three) times daily as needed (dry eyes).     No current facility-administered medications for this encounter.    REVIEW OF SYSTEMS:  A 10+ POINT REVIEW OF SYSTEMS WAS OBTAINED including neurology, dermatology, psychiatry, cardiac, respiratory, lymph, extremities, GI, GU, musculoskeletal, constitutional, reproductive, HEENT.  She denies any pain within the chest area or significant cough.  She reported some mild hemoptysis after her biopsy none since.   PHYSICAL EXAM:  height is 5' 4"  (1.626 m) and weight is 108 lb (49 kg). Her oral temperature is 97.6 F (36.4 C). Her blood pressure is 137/93 (abnormal) and her pulse is 94. Her respiration is 20 and oxygen saturation is 96%.   General: Alert and oriented, in no acute distress HEENT: Head is normocephalic. Extraocular movements are intact.  Neck: Neck is supple, no palpable cervical or supraclavicular lymphadenopathy. Heart: Regular in rate and rhythm with no murmurs, rubs, or gallops. Chest: Clear to auscultation bilaterally, with no rhonchi, wheezes, or rales.  Tattoos in place along the right anterior chest from prior radiation therapy for lung cancer Abdomen: Soft, nontender, nondistended, with no rigidity or guarding. Extremities: No cyanosis or edema. Lymphatics: see Neck Exam Skin: No  concerning lesions. Musculoskeletal: symmetric strength and muscle tone throughout. Neurologic: Cranial nerves II through XII are grossly intact. No obvious focalities. Speech is fluent. Coordination is intact. Psychiatric: Judgment and insight are intact. Affect is appropriate.   ECOG = 1  0 - Asymptomatic (Fully active, able to carry on all predisease activities without restriction)  1 - Symptomatic but completely ambulatory (Restricted in physically strenuous activity but ambulatory and able to carry out work of a light or sedentary nature. For example, light housework, office work)  2 - Symptomatic, <50% in bed during the day (Ambulatory and capable of all self care but unable to carry out any work activities. Up and about  more than 50% of waking hours)  3 - Symptomatic, >50% in bed, but not bedbound (Capable of only limited self-care, confined to bed or chair 50% or more of waking hours)  4 - Bedbound (Completely disabled. Cannot carry on any self-care. Totally confined to bed or chair)  5 - Death   Eustace Pen MM, Creech RH, Tormey DC, et al. [phone]). "Toxicity and response criteria of the [facility]". [place] Oncol. 5 (6): 649-55  LABORATORY DATA:  Lab Results  Component Value Date   WBC 9.0 02/24/2022   HGB 13.4 02/24/2022   HCT 40.6 02/24/2022   MCV 88.1 02/24/2022   PLT 280 02/24/2022   NEUTROABS 5.5 12/12/2021   Lab Results  Component Value Date   NA 140 02/24/2022   K 4.1 02/24/2022   CL 105 02/24/2022   CO2 26 02/24/2022   GLUCOSE 105 (H) 02/24/2022   BUN 11 02/24/2022   CREATININE 0.87 02/24/2022   CALCIUM 9.3 02/24/2022      RADIOGRAPHY: DG CHEST PORT 1 VIEW  Result Date: 02/24/2022 CLINICAL DATA:  Status post bronchoscopy. EXAM: PORTABLE CHEST 1 VIEW COMPARISON:  CT chest dated January 30, 2022. Chest x-ray dated Nov 13, 2016. FINDINGS: The heart size and mediastinal contours are within normal limits. Normal pulmonary vascularity.  Similar hyperinflated lungs with emphysematous changes and postsurgical changes at the right lung apex with prior right upper lobectomy and partial first and second rib resections. New fiducial markers in the left upper lobe and lingula. No focal consolidation, pleural effusion, or pneumothorax. No acute osseous abnormality. IMPRESSION: 1. New fiducial markers in the left upper lobe and lingula. No pneumothorax. 2. COPD and postsurgical changes. Electronically Signed   By: Titus Dubin M.D.   On: 02/24/2022 10:27   DG C-Arm 1-60 Min-No Report  Result Date: 02/24/2022 Fluoroscopy was utilized by the requesting physician.  No radiographic interpretation.   DG C-ARM BRONCHOSCOPY  Result Date: 02/24/2022 C-ARM BRONCHOSCOPY: Fluoroscopy was utilized by the requesting physician.  No radiographic interpretation.      IMPRESSION: Non-small cell carcinoma of the lingula - nodule of the posterior lingula   Non-small cell carcinoma of the lingula - nodule of the posterior lingula, Stage IA2 (cT1b, cN0, cM0)  She would be a good candidate for stereotactic body radiation therapy directed at her solitary nodule in the lingula of the left lung.  Given her prior lung cancer surgery and radiation therapy to the right lung as well as her current pulmonary function studies she is not felt to be a good candidate for surgical resection.  Today, I talked to the patient about the findings and work-up thus far.  We discussed the natural history of non-small cell lung cancer and general treatment, highlighting the role of radiotherapy in the management.  We discussed the available radiation techniques, and focused on the details of logistics and delivery.  We reviewed the anticipated acute and late sequelae associated with radiation in this setting.  The patient was encouraged to ask questions that I answered to the best of my ability.  A patient consent form was discussed and signed.  We retained a copy for our records.   The patient would like to proceed with radiation and will be scheduled for CT simulation.  PLAN: She will return next week for CT simulation with treatments to begin in mid October.  Anticipate 3 SBRT treatments directed at the non-small cell lung cancer in the lingula of the left lung.  60 minutes of total time was spent for this patient encounter, including preparation, face-to-face counseling with the patient and coordination of care, physical exam, and documentation of the encounter.   ------------------------------------------------  Blair Promise, PhD, MD  This document serves as a record of services personally performed by Gery Pray, MD. It was created on his behalf by Roney Mans, a trained medical scribe. The creation of this record is based on the scribe's personal observations and the provider's statements to them. This document has been checked and approved by the attending provider.

## 2022-03-06 NOTE — Progress Notes (Signed)
The proposed treatment discussed in conference is for discussion purpose only and is not a binding recommendation.  The patients have not been physically examined, or presented with their treatment options.  Therefore, final treatment plans cannot be decided.  

## 2022-03-10 ENCOUNTER — Telehealth: Payer: Self-pay | Admitting: Internal Medicine

## 2022-03-10 ENCOUNTER — Other Ambulatory Visit: Payer: Self-pay

## 2022-03-10 ENCOUNTER — Ambulatory Visit
Admission: RE | Admit: 2022-03-10 | Discharge: 2022-03-10 | Disposition: A | Discharge status: Home / Self Care | Payer: Medicare HMO | Source: Ambulatory Visit | Attending: Radiation Oncology | Admitting: Radiation Oncology

## 2022-03-10 DIAGNOSIS — C3410 Malignant neoplasm of upper lobe, unspecified bronchus or lung: Secondary | ICD-10-CM

## 2022-03-10 DIAGNOSIS — C3412 Malignant neoplasm of upper lobe, left bronchus or lung: Secondary | ICD-10-CM | POA: Diagnosis not present

## 2022-03-10 DIAGNOSIS — Z510 Encounter for antineoplastic radiation therapy: Secondary | ICD-10-CM | POA: Insufficient documentation

## 2022-03-10 DIAGNOSIS — Z87891 Personal history of nicotine dependence: Secondary | ICD-10-CM | POA: Diagnosis not present

## 2022-03-10 DIAGNOSIS — C3411 Malignant neoplasm of upper lobe, right bronchus or lung: Secondary | ICD-10-CM

## 2022-03-11 MED ORDER — MOMETASONE FURO-FORMOTEROL FUM 200-5 MCG/ACT IN AERO: 2.0000 | INHALATION_SPRAY | Freq: Two times a day (BID) | RESPIRATORY_TRACT | 5 refills | Status: DC

## 2022-03-11 NOTE — Telephone Encounter (Signed)
Spoke with pt and advised that [facility] Rx has been sent to pharmacy. Nothing further needed at this time.

## 2022-03-17 DIAGNOSIS — E7800 Pure hypercholesterolemia, unspecified: Secondary | ICD-10-CM | POA: Diagnosis not present

## 2022-03-17 DIAGNOSIS — R7301 Impaired fasting glucose: Secondary | ICD-10-CM | POA: Diagnosis not present

## 2022-03-17 DIAGNOSIS — Z23 Encounter for immunization: Secondary | ICD-10-CM | POA: Diagnosis not present

## 2022-03-17 DIAGNOSIS — J439 Emphysema, unspecified: Secondary | ICD-10-CM | POA: Diagnosis not present

## 2022-03-17 DIAGNOSIS — Z510 Encounter for antineoplastic radiation therapy: Secondary | ICD-10-CM | POA: Insufficient documentation

## 2022-03-17 DIAGNOSIS — C3412 Malignant neoplasm of upper lobe, left bronchus or lung: Secondary | ICD-10-CM | POA: Insufficient documentation

## 2022-03-17 DIAGNOSIS — D6851 Activated protein C resistance: Secondary | ICD-10-CM | POA: Diagnosis not present

## 2022-03-17 DIAGNOSIS — Z853 Personal history of malignant neoplasm of breast: Secondary | ICD-10-CM | POA: Diagnosis not present

## 2022-03-17 DIAGNOSIS — J449 Chronic obstructive pulmonary disease, unspecified: Secondary | ICD-10-CM | POA: Diagnosis not present

## 2022-03-17 DIAGNOSIS — I779 Disorder of arteries and arterioles, unspecified: Secondary | ICD-10-CM | POA: Diagnosis not present

## 2022-03-17 DIAGNOSIS — Z8673 Personal history of transient ischemic attack (TIA), and cerebral infarction without residual deficits: Secondary | ICD-10-CM | POA: Diagnosis not present

## 2022-03-17 DIAGNOSIS — Z87891 Personal history of nicotine dependence: Secondary | ICD-10-CM | POA: Diagnosis not present

## 2022-03-17 DIAGNOSIS — Z85118 Personal history of other malignant neoplasm of bronchus and lung: Secondary | ICD-10-CM | POA: Diagnosis not present

## 2022-03-19 ENCOUNTER — Ambulatory Visit: Payer: Medicare HMO | Admitting: Radiation Oncology

## 2022-03-19 DIAGNOSIS — Z87891 Personal history of nicotine dependence: Secondary | ICD-10-CM | POA: Diagnosis not present

## 2022-03-19 DIAGNOSIS — C3412 Malignant neoplasm of upper lobe, left bronchus or lung: Secondary | ICD-10-CM | POA: Diagnosis not present

## 2022-03-20 ENCOUNTER — Ambulatory Visit: Payer: Medicare HMO | Admitting: Radiation Oncology

## 2022-03-21 ENCOUNTER — Ambulatory Visit: Payer: Medicare HMO | Admitting: Radiation Oncology

## 2022-03-24 ENCOUNTER — Other Ambulatory Visit: Payer: Self-pay

## 2022-03-24 ENCOUNTER — Ambulatory Visit
Admission: RE | Admit: 2022-03-24 | Discharge: 2022-03-24 | Disposition: A | Discharge status: Home / Self Care | Payer: Medicare HMO | Source: Ambulatory Visit | Attending: Radiation Oncology | Admitting: Radiation Oncology

## 2022-03-24 ENCOUNTER — Ambulatory Visit: Payer: Medicare HMO | Admitting: Radiation Oncology

## 2022-03-24 DIAGNOSIS — Z87891 Personal history of nicotine dependence: Secondary | ICD-10-CM | POA: Diagnosis not present

## 2022-03-24 DIAGNOSIS — Z510 Encounter for antineoplastic radiation therapy: Secondary | ICD-10-CM | POA: Diagnosis not present

## 2022-03-24 DIAGNOSIS — C3412 Malignant neoplasm of upper lobe, left bronchus or lung: Secondary | ICD-10-CM | POA: Diagnosis not present

## 2022-03-24 DIAGNOSIS — C3410 Malignant neoplasm of upper lobe, unspecified bronchus or lung: Secondary | ICD-10-CM

## 2022-03-24 LAB — RAD ONC ARIA SESSION SUMMARY
Course Elapsed Days: 0
Plan Fractions Treated to Date: 1
Plan Prescribed Dose Per Fraction: 4.5 Gy
Plan Total Fractions Prescribed: 10
Plan Total Prescribed Dose: 45 Gy
Reference Point Dosage Given to Date: 4.5 Gy
Reference Point Session Dosage Given: 4.5 Gy
Session Number: 1

## 2022-03-25 ENCOUNTER — Ambulatory Visit
Admission: RE | Admit: 2022-03-25 | Discharge: 2022-03-25 | Disposition: A | Discharge status: Home / Self Care | Payer: Medicare HMO | Source: Ambulatory Visit | Attending: Radiation Oncology | Admitting: Radiation Oncology

## 2022-03-25 ENCOUNTER — Other Ambulatory Visit: Payer: Self-pay

## 2022-03-25 DIAGNOSIS — C3412 Malignant neoplasm of upper lobe, left bronchus or lung: Secondary | ICD-10-CM | POA: Diagnosis not present

## 2022-03-25 DIAGNOSIS — Z87891 Personal history of nicotine dependence: Secondary | ICD-10-CM | POA: Diagnosis not present

## 2022-03-25 DIAGNOSIS — Z510 Encounter for antineoplastic radiation therapy: Secondary | ICD-10-CM | POA: Diagnosis not present

## 2022-03-25 LAB — RAD ONC ARIA SESSION SUMMARY
Course Elapsed Days: 1
Plan Fractions Treated to Date: 2
Plan Prescribed Dose Per Fraction: 4.5 Gy
Plan Total Fractions Prescribed: 10
Plan Total Prescribed Dose: 45 Gy
Reference Point Dosage Given to Date: 9 Gy
Reference Point Session Dosage Given: 4.5 Gy
Session Number: 2

## 2022-03-26 ENCOUNTER — Ambulatory Visit: Payer: Medicare HMO | Admitting: Radiation Oncology

## 2022-03-26 ENCOUNTER — Other Ambulatory Visit: Payer: Self-pay

## 2022-03-26 ENCOUNTER — Ambulatory Visit
Admission: RE | Admit: 2022-03-26 | Discharge: 2022-03-26 | Disposition: A | Discharge status: Home / Self Care | Payer: Medicare HMO | Source: Ambulatory Visit | Attending: Radiation Oncology | Admitting: Radiation Oncology

## 2022-03-26 DIAGNOSIS — Z510 Encounter for antineoplastic radiation therapy: Secondary | ICD-10-CM | POA: Diagnosis not present

## 2022-03-26 DIAGNOSIS — C3412 Malignant neoplasm of upper lobe, left bronchus or lung: Secondary | ICD-10-CM | POA: Diagnosis not present

## 2022-03-26 DIAGNOSIS — Z87891 Personal history of nicotine dependence: Secondary | ICD-10-CM | POA: Diagnosis not present

## 2022-03-26 LAB — RAD ONC ARIA SESSION SUMMARY
Course Elapsed Days: 2
Plan Fractions Treated to Date: 3
Plan Prescribed Dose Per Fraction: 4.5 Gy
Plan Total Fractions Prescribed: 10
Plan Total Prescribed Dose: 45 Gy
Reference Point Dosage Given to Date: 13.5 Gy
Reference Point Session Dosage Given: 4.5 Gy
Session Number: 3

## 2022-03-27 ENCOUNTER — Other Ambulatory Visit: Payer: Self-pay

## 2022-03-27 ENCOUNTER — Ambulatory Visit
Admission: RE | Admit: 2022-03-27 | Discharge: 2022-03-27 | Disposition: A | Discharge status: Home / Self Care | Payer: Medicare HMO | Source: Ambulatory Visit | Attending: Radiation Oncology | Admitting: Radiation Oncology

## 2022-03-27 DIAGNOSIS — Z510 Encounter for antineoplastic radiation therapy: Secondary | ICD-10-CM | POA: Diagnosis not present

## 2022-03-27 DIAGNOSIS — Z87891 Personal history of nicotine dependence: Secondary | ICD-10-CM | POA: Diagnosis not present

## 2022-03-27 DIAGNOSIS — C3412 Malignant neoplasm of upper lobe, left bronchus or lung: Secondary | ICD-10-CM | POA: Diagnosis not present

## 2022-03-27 LAB — RAD ONC ARIA SESSION SUMMARY
Course Elapsed Days: 3
Plan Fractions Treated to Date: 4
Plan Prescribed Dose Per Fraction: 4.5 Gy
Plan Total Fractions Prescribed: 10
Plan Total Prescribed Dose: 45 Gy
Reference Point Dosage Given to Date: 18 Gy
Reference Point Session Dosage Given: 4.5 Gy
Session Number: 4

## 2022-03-28 ENCOUNTER — Other Ambulatory Visit: Payer: Self-pay

## 2022-03-28 ENCOUNTER — Ambulatory Visit
Admission: RE | Admit: 2022-03-28 | Discharge: 2022-03-28 | Disposition: A | Discharge status: Home / Self Care | Payer: Medicare HMO | Source: Ambulatory Visit | Attending: Radiation Oncology | Admitting: Radiation Oncology

## 2022-03-28 DIAGNOSIS — C3412 Malignant neoplasm of upper lobe, left bronchus or lung: Secondary | ICD-10-CM | POA: Diagnosis not present

## 2022-03-28 DIAGNOSIS — Z510 Encounter for antineoplastic radiation therapy: Secondary | ICD-10-CM | POA: Diagnosis not present

## 2022-03-28 DIAGNOSIS — Z87891 Personal history of nicotine dependence: Secondary | ICD-10-CM | POA: Diagnosis not present

## 2022-03-28 LAB — RAD ONC ARIA SESSION SUMMARY
Course Elapsed Days: 4
Plan Fractions Treated to Date: 5
Plan Prescribed Dose Per Fraction: 4.5 Gy
Plan Total Fractions Prescribed: 10
Plan Total Prescribed Dose: 45 Gy
Reference Point Dosage Given to Date: 22.5 Gy
Reference Point Session Dosage Given: 4.5 Gy
Session Number: 5

## 2022-03-31 ENCOUNTER — Other Ambulatory Visit: Payer: Self-pay

## 2022-03-31 ENCOUNTER — Ambulatory Visit
Admission: RE | Admit: 2022-03-31 | Discharge: 2022-03-31 | Disposition: A | Discharge status: Home / Self Care | Payer: Medicare HMO | Source: Ambulatory Visit | Attending: Radiation Oncology | Admitting: Radiation Oncology

## 2022-03-31 DIAGNOSIS — Z510 Encounter for antineoplastic radiation therapy: Secondary | ICD-10-CM | POA: Diagnosis not present

## 2022-03-31 DIAGNOSIS — C3412 Malignant neoplasm of upper lobe, left bronchus or lung: Secondary | ICD-10-CM | POA: Diagnosis not present

## 2022-03-31 DIAGNOSIS — Z87891 Personal history of nicotine dependence: Secondary | ICD-10-CM | POA: Diagnosis not present

## 2022-03-31 LAB — RAD ONC ARIA SESSION SUMMARY
Course Elapsed Days: 7
Plan Fractions Treated to Date: 6
Plan Prescribed Dose Per Fraction: 4.5 Gy
Plan Total Fractions Prescribed: 10
Plan Total Prescribed Dose: 45 Gy
Reference Point Dosage Given to Date: 27 Gy
Reference Point Session Dosage Given: 4.5 Gy
Session Number: 6

## 2022-04-01 ENCOUNTER — Other Ambulatory Visit: Payer: Self-pay

## 2022-04-01 ENCOUNTER — Ambulatory Visit
Admission: RE | Admit: 2022-04-01 | Discharge: 2022-04-01 | Disposition: A | Discharge status: Home / Self Care | Payer: Medicare HMO | Source: Ambulatory Visit | Attending: Radiation Oncology | Admitting: Radiation Oncology

## 2022-04-01 DIAGNOSIS — Z510 Encounter for antineoplastic radiation therapy: Secondary | ICD-10-CM | POA: Diagnosis not present

## 2022-04-01 DIAGNOSIS — C3412 Malignant neoplasm of upper lobe, left bronchus or lung: Secondary | ICD-10-CM | POA: Diagnosis not present

## 2022-04-01 DIAGNOSIS — Z87891 Personal history of nicotine dependence: Secondary | ICD-10-CM | POA: Diagnosis not present

## 2022-04-01 LAB — RAD ONC ARIA SESSION SUMMARY
Course Elapsed Days: 8
Plan Fractions Treated to Date: 7
Plan Prescribed Dose Per Fraction: 4.5 Gy
Plan Total Fractions Prescribed: 10
Plan Total Prescribed Dose: 45 Gy
Reference Point Dosage Given to Date: 31.5 Gy
Reference Point Session Dosage Given: 4.5 Gy
Session Number: 7

## 2022-04-02 ENCOUNTER — Other Ambulatory Visit: Payer: Self-pay

## 2022-04-02 ENCOUNTER — Ambulatory Visit
Admission: RE | Admit: 2022-04-02 | Discharge: 2022-04-02 | Disposition: A | Discharge status: Home / Self Care | Payer: Medicare HMO | Source: Ambulatory Visit | Attending: Radiation Oncology | Admitting: Radiation Oncology

## 2022-04-02 DIAGNOSIS — Z87891 Personal history of nicotine dependence: Secondary | ICD-10-CM | POA: Diagnosis not present

## 2022-04-02 DIAGNOSIS — C3412 Malignant neoplasm of upper lobe, left bronchus or lung: Secondary | ICD-10-CM | POA: Diagnosis not present

## 2022-04-02 DIAGNOSIS — Z510 Encounter for antineoplastic radiation therapy: Secondary | ICD-10-CM | POA: Diagnosis not present

## 2022-04-02 LAB — RAD ONC ARIA SESSION SUMMARY
Course Elapsed Days: 9
Plan Fractions Treated to Date: 8
Plan Prescribed Dose Per Fraction: 4.5 Gy
Plan Total Fractions Prescribed: 10
Plan Total Prescribed Dose: 45 Gy
Reference Point Dosage Given to Date: 36 Gy
Reference Point Session Dosage Given: 4.5 Gy
Session Number: 8

## 2022-04-03 ENCOUNTER — Ambulatory Visit
Admission: RE | Admit: 2022-04-03 | Discharge: 2022-04-03 | Disposition: A | Discharge status: Home / Self Care | Payer: Medicare HMO | Source: Ambulatory Visit | Attending: Radiation Oncology | Admitting: Radiation Oncology

## 2022-04-03 ENCOUNTER — Other Ambulatory Visit: Payer: Self-pay

## 2022-04-03 DIAGNOSIS — Z87891 Personal history of nicotine dependence: Secondary | ICD-10-CM | POA: Diagnosis not present

## 2022-04-03 DIAGNOSIS — C3412 Malignant neoplasm of upper lobe, left bronchus or lung: Secondary | ICD-10-CM | POA: Diagnosis not present

## 2022-04-03 DIAGNOSIS — Z510 Encounter for antineoplastic radiation therapy: Secondary | ICD-10-CM | POA: Diagnosis not present

## 2022-04-03 LAB — RAD ONC ARIA SESSION SUMMARY
Course Elapsed Days: 10
Plan Fractions Treated to Date: 9
Plan Prescribed Dose Per Fraction: 4.5 Gy
Plan Total Fractions Prescribed: 10
Plan Total Prescribed Dose: 45 Gy
Reference Point Dosage Given to Date: 40.5 Gy
Reference Point Session Dosage Given: 4.5 Gy
Session Number: 9

## 2022-04-03 LAB — FUNGUS CULTURE WITH STAIN

## 2022-04-03 LAB — FUNGUS CULTURE RESULT

## 2022-04-03 LAB — FUNGAL ORGANISM REFLEX

## 2022-04-04 ENCOUNTER — Encounter: Payer: Self-pay | Admitting: Radiation Oncology

## 2022-04-04 ENCOUNTER — Other Ambulatory Visit: Payer: Self-pay

## 2022-04-04 ENCOUNTER — Ambulatory Visit
Admission: RE | Admit: 2022-04-04 | Discharge: 2022-04-04 | Disposition: A | Discharge status: Home / Self Care | Payer: Medicare HMO | Source: Ambulatory Visit | Attending: Radiation Oncology | Admitting: Radiation Oncology

## 2022-04-04 DIAGNOSIS — Z510 Encounter for antineoplastic radiation therapy: Secondary | ICD-10-CM | POA: Diagnosis not present

## 2022-04-04 DIAGNOSIS — Z87891 Personal history of nicotine dependence: Secondary | ICD-10-CM | POA: Diagnosis not present

## 2022-04-04 DIAGNOSIS — C3412 Malignant neoplasm of upper lobe, left bronchus or lung: Secondary | ICD-10-CM | POA: Diagnosis not present

## 2022-04-04 LAB — RAD ONC ARIA SESSION SUMMARY
Course Elapsed Days: 11
Plan Fractions Treated to Date: 10
Plan Prescribed Dose Per Fraction: 4.5 Gy
Plan Total Fractions Prescribed: 10
Plan Total Prescribed Dose: 45 Gy
Reference Point Dosage Given to Date: 45 Gy
Reference Point Session Dosage Given: 4.5 Gy
Session Number: 10

## 2022-04-09 LAB — ACID FAST CULTURE WITH REFLEXED SENSITIVITIES (MYCOBACTERIA): Acid Fast Culture: NEGATIVE

## 2022-04-23 ENCOUNTER — Telehealth: Payer: Self-pay | Admitting: Oncology

## 2022-04-23 DIAGNOSIS — U071 COVID-19: Secondary | ICD-10-CM | POA: Diagnosis not present

## 2022-04-23 DIAGNOSIS — R058 Other specified cough: Secondary | ICD-10-CM | POA: Diagnosis not present

## 2022-04-23 DIAGNOSIS — R0981 Nasal congestion: Secondary | ICD-10-CM | POA: Diagnosis not present

## 2022-04-23 NOTE — Telephone Encounter (Signed)
Jessica Gilbert called and she has been diagnosed with Covid.  Her primary care doctor advised her to contact Dr. Sondra Come since she recently finished radiation treatments to see if there is anything she need to do.  She reports having sinus congestion, an occasional cough and shortness of breath with exertion.

## 2022-04-25 ENCOUNTER — Encounter: Payer: Self-pay | Admitting: Oncology

## 2022-04-25 NOTE — Telephone Encounter (Addendum)
Called Jessica Gilbert and she is still feeling rotten.  She called her PCP and was put on a prednisone dose pack but it is making her heart race.  Advised her to call her PCP's office today and she said she doesn't think they are open today. She reports having a nonproductive cough and said her oxygen saturation this morning was 94-95%. Discussed with Dr. Sondra Come and he recommends calling her PCP to let them know about her HR.  She verbalized understanding and agreement.

## 2022-05-04 ENCOUNTER — Encounter: Payer: Self-pay | Admitting: Radiation Oncology

## 2022-05-10 NOTE — Progress Notes (Incomplete)
  Radiation Oncology         (336) [phone] ________________________________  Patient Name: Jessica Gilbert MRN: [id number] DOB: [birth year] Referring Physician: Baltazar Apo (Profile Not Attached) Date of Service: 04/04/2022 [facility], [place]                                                        End Of Treatment Note  Diagnoses: C34.12-Malignant neoplasm of upper lobe, left bronchus or lung C50.512-Malignant neoplasm of lower-outer quadrant of left female breast  Cancer Staging: The encounter diagnosis was Cancer of lingula of lung ([place]).   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 During her final weekly treatment check on 04/01/22, the patient reported fatigue, SOB, cough, ongoing left chest tightness, and an increase in her indigestion (managed with Pepcid AC with relief).  She denied any shortness of breath with the chest tightness.  Plan: The patient will follow-up with radiation oncology in one month .  ________________________________________________ -----------------------------------  Blair Promise, PhD, MD  This document serves as a record of services personally performed by Gery Pray, MD. It was created on his behalf by Roney Mans, a trained medical scribe. The creation of this record is based on the scribe's personal observations and the provider's statements to them. This document has been checked and approved by the attending provider.

## 2022-05-11 NOTE — Progress Notes (Signed)
Radiation Oncology         (336) [phone] ________________________________  Name: Jessica Gilbert MRN: [id number]  Date: 05/12/2022  DOB: 10-23-1945  Follow-Up Visit Note  CC: Faustino Congress, NP  Collene Gobble, MD    ICD-10-CM   1. Primary cancer of right upper lobe of lung ([place]) [C34.11]  C34.11     2. Cancer of lingula of lung ([facility])  C34.10 CT CHEST WO CONTRAST      Diagnosis: The encounter diagnosis was Cancer of lingula of lung ([place]).   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 The patient tolerated radiation therapy relatively well. During her final weekly treatment check on 04/01/22, the patient reported fatigue, SOB, cough, ongoing left chest tightness, and an increase in her indigestion (managed with Pepcid AC with relief).  She otherwise denied any associated shortness of breath or other concerns.   She reports testing positive for COVID approximately 3 weeks after completion of her radiation therapy.  She was having some fatigue after completion of her radiation therapy and this only compounded the issue.  She reports feeling very poorly for several days with her COVID infection.  She was placed on steroids.  She did not require admission to the hospital.  She reports ongoing fatigue in light of her COVID infection recently as well as her radiation therapy.  She denies any significant cough or hemoptysis.  She has some discomfort in the inferior breast  region.   Allergies:  is allergic to aspirin, codeine, and vitamin d analogs.  Meds: Current Outpatient Medications  Medication Sig Dispense Refill   diphenhydrAMINE (BENADRYL) 25 MG tablet Take 25 mg by mouth at bedtime.     fluticasone (FLONASE SENSIMIST) 27.5 MCG/SPRAY nasal spray Place 1 spray into the nose daily.     guaiFENesin (MUCINEX) 600 MG 12 hr tablet Take 600 mg by mouth 2 (two) times daily as needed for to loosen phlegm.     ibuprofen (ADVIL,MOTRIN) 200 MG tablet Take 200 mg by mouth every 6 (six) hours as needed for moderate pain.     mometasone-formoterol (DULERA) 200-5 MCG/ACT AERO Inhale 2 puffs into the lungs 2 (two) times daily. 1 each 5   Multiple Vitamin (MULTIVITAMIN WITH MINERALS) TABS tablet Take 1 tablet by mouth daily.     telmisartan (MICARDIS) 40 MG tablet Take 40 mg by mouth daily.     tetrahydrozoline-zinc (VISINE-AC) 0.05-0.25 % ophthalmic solution Place 1 drop into both eyes 3 (three) times daily as needed (dry eyes).     albuterol (VENTOLIN HFA) 108 (90 Base) MCG/ACT inhaler Inhale 2 puffs into the lungs every 4 (four) hours as needed (Wheezing/SOB).     No current facility-administered medications for this encounter.    Physical Findings: The patient is in no acute distress. Patient is alert and oriented.  height is 5' 4"  (1.626 m) and weight is 104 lb (47.2 kg). Her temperature is 97.7 F (36.5 C). Her blood pressure is 161/87 (abnormal) and her pulse  is 93. Her respiration is 20 and oxygen saturation is 93%. .Lungs are clear to auscultation bilaterally. Heart has regular rate and rhythm. No palpable cervical, supraclavicular, or axillary adenopathy. Abdomen soft, non-tender, normal bowel sounds.   Lab Findings: Lab Results  Component Value Date   WBC 9.0 02/24/2022   HGB 13.4 02/24/2022   HCT 40.6 02/24/2022   MCV 88.1 02/24/2022   PLT 280 02/24/2022    Radiographic Findings: No results found.  Impression: The encounter diagnosis was Cancer  of lingula of lung ([place]).   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  She continues to have significant fatigue at this time which in part is related to her recent COVID infection.  Plan: She will proceed with CT scan of the chest in 3 months for follow-up of her SBRT treatment.  She will be seen soon after this chest CT scan for clinical exam and review of her imaging studies.    ____________________________________  Blair Promise, PhD, MD  This document serves as a record of services personally performed by Gery Pray, MD. It was created on his behalf by Roney Mans, a trained medical scribe. The creation of this record is based on the scribe's personal observations and the provider's statements to them. This document has been checked and approved by the attending provider.

## 2022-05-12 ENCOUNTER — Encounter: Payer: Self-pay | Admitting: Radiation Oncology

## 2022-05-12 ENCOUNTER — Ambulatory Visit
Admission: RE | Admit: 2022-05-12 | Discharge: 2022-05-12 | Disposition: A | Discharge status: Home / Self Care | Payer: Medicare HMO | Source: Ambulatory Visit | Attending: Radiation Oncology | Admitting: Radiation Oncology

## 2022-05-12 VITALS — BP 161/87 | HR 93 | Temp 97.7°F | Resp 20 | Ht 64.0 in | Wt 104.0 lb

## 2022-05-12 DIAGNOSIS — Z923 Personal history of irradiation: Secondary | ICD-10-CM | POA: Insufficient documentation

## 2022-05-12 DIAGNOSIS — Z7951 Long term (current) use of inhaled steroids: Secondary | ICD-10-CM | POA: Insufficient documentation

## 2022-05-12 DIAGNOSIS — Z8616 Personal history of COVID-19: Secondary | ICD-10-CM | POA: Insufficient documentation

## 2022-05-12 DIAGNOSIS — C3411 Malignant neoplasm of upper lobe, right bronchus or lung: Secondary | ICD-10-CM | POA: Insufficient documentation

## 2022-05-12 DIAGNOSIS — Z79899 Other long term (current) drug therapy: Secondary | ICD-10-CM | POA: Insufficient documentation

## 2022-05-12 DIAGNOSIS — C3410 Malignant neoplasm of upper lobe, unspecified bronchus or lung: Secondary | ICD-10-CM

## 2022-05-12 HISTORY — DX: Personal history of irradiation: Z92.3

## 2022-05-12 NOTE — Progress Notes (Signed)
Jessica Gilbert is here today for follow up post radiation to the lung.  Lung Side: Left lung, patient completed treatment on 04/04/22.  Does the patient complain of any of the following: Pain:  Reports having discomfort to left chest at times.  Shortness of breath w/wo exertion: Yes, mostly on exertion. Cough: Yes,productive.  Hemoptysis: No Pain with swallowing: No Swallowing/choking concerns: No Appetite: Good Weight:  Wt Readings from Last 3 Encounters:  05/12/22 104 lb (47.2 kg)  03/06/22 108 lb (49 kg)  03/05/22 106 lb 6.4 oz (48.3 kg)   Energy Level: Low energy Post radiation skin Changes: No     Additional comments if applicable:  Patient reports having  3 falls recently. Patient reports issues with balance recently.   Patient requesting refill on Pro air inhaler.    BP (!) 161/87 (BP Location: Left Arm, Patient Position: Sitting, Cuff Size: Normal)   Pulse 93   Temp 97.7 F (36.5 C)   Resp 20   Ht 5' 4"  (1.626 m)   Wt 104 lb (47.2 kg)   SpO2 93%   BMI 17.85 kg/m

## 2022-07-07 ENCOUNTER — Telehealth: Payer: Self-pay | Admitting: *Deleted

## 2022-07-07 NOTE — Telephone Encounter (Signed)
Patient called asking about mammogram order to be done at [facility].  Order already in the chart, copy faxed to [facility] at [phone], fax confirmation received.  Informed patient, she verbalizes understanding.

## 2022-07-14 DIAGNOSIS — R928 Other abnormal and inconclusive findings on diagnostic imaging of breast: Secondary | ICD-10-CM | POA: Diagnosis not present

## 2022-07-14 DIAGNOSIS — Z853 Personal history of malignant neoplasm of breast: Secondary | ICD-10-CM | POA: Diagnosis not present

## 2022-07-16 DIAGNOSIS — H5203 Hypermetropia, bilateral: Secondary | ICD-10-CM | POA: Diagnosis not present

## 2022-07-17 ENCOUNTER — Encounter: Payer: Self-pay | Admitting: Hematology and Oncology

## 2022-08-12 ENCOUNTER — Ambulatory Visit (HOSPITAL_COMMUNITY)
Admission: RE | Admit: 2022-08-12 | Discharge: 2022-08-12 | Disposition: A | Discharge status: Home / Self Care | Payer: Medicare HMO | Source: Ambulatory Visit | Attending: Radiation Oncology | Admitting: Radiation Oncology

## 2022-08-12 ENCOUNTER — Encounter (HOSPITAL_COMMUNITY): Payer: Self-pay

## 2022-08-12 DIAGNOSIS — I700 Atherosclerosis of aorta: Secondary | ICD-10-CM | POA: Insufficient documentation

## 2022-08-12 DIAGNOSIS — C3490 Malignant neoplasm of unspecified part of unspecified bronchus or lung: Secondary | ICD-10-CM | POA: Diagnosis not present

## 2022-08-12 DIAGNOSIS — J439 Emphysema, unspecified: Secondary | ICD-10-CM | POA: Insufficient documentation

## 2022-08-12 DIAGNOSIS — C3410 Malignant neoplasm of upper lobe, unspecified bronchus or lung: Secondary | ICD-10-CM | POA: Diagnosis not present

## 2022-08-13 NOTE — Progress Notes (Signed)
Radiation Oncology         (336) [phone] ________________________________  Name: Jessica Gilbert MRN: [id number]  Date: 08/14/2022  DOB: 03-22-1946  Follow-Up Visit Note  CC: Faustino Congress, NP  Collene Gobble, MD  No diagnosis found.  Diagnosis: The encounter diagnosis was Cancer of lingula of lung ([place]).   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 She was last seen here for follow-up on 05/12/22.  Her most recent chest CT on 08/12/22 shows a significant decrease in size of the small cavitary lingular lesion seen on the prior CT, with almost no definite measurable disease appreciated. CT however showed a new 12 x 10 mm irregular nodular lesion in the left lower lobe worrisome for neoplasm, an enlarging right lower lobe pulmonary nodule worrisome for an enlarging neoplasm, and a new irregular nodular density in the left upper lobe anteriorly and medially which is likely reflective of inflammation or atelectasis. CT otherwise showed no evidence of mediastinal or hilar mass adenopathy, and stable surgical changes involving the right lung.    Other imaging performed in the interval includes a bilateral diagnostic mammogram on 07/14/22 which showed stable post treatment changes in the left breast and no evidence of malignancy in either breast.    ***                             Allergies:  is allergic to aspirin, codeine, and vitamin d analogs.  Meds: Current  Outpatient Medications  Medication Sig Dispense Refill   albuterol (VENTOLIN HFA) 108 (90 Base) MCG/ACT inhaler Inhale 2 puffs into the lungs every 4 (four) hours as needed (Wheezing/SOB).     diphenhydrAMINE (BENADRYL) 25 MG tablet Take 25 mg by mouth at bedtime.     fluticasone (FLONASE SENSIMIST) 27.5 MCG/SPRAY nasal spray Place 1 spray into the nose daily.     guaiFENesin (MUCINEX) 600 MG 12 hr tablet Take 600 mg by mouth 2 (two) times daily as needed for to loosen phlegm.     ibuprofen (ADVIL,MOTRIN) 200 MG tablet Take 200 mg by mouth every 6 (six) hours as needed for moderate pain.     mometasone-formoterol (DULERA) 200-5 MCG/ACT AERO Inhale 2 puffs into the lungs 2 (two) times daily. 1 each 5   Multiple Vitamin (MULTIVITAMIN WITH MINERALS) TABS tablet Take 1 tablet by mouth daily.     telmisartan (MICARDIS) 40 MG tablet Take 40 mg by mouth daily.     tetrahydrozoline-zinc (VISINE-AC) 0.05-0.25 % ophthalmic solution Place 1 drop into both eyes 3 (three) times daily as needed (dry eyes).     No current facility-administered medications for this encounter.    Physical Findings: The patient is in no acute distress. Patient is alert and oriented.  vitals were not taken for this visit. .  No significant changes. Lungs are clear to auscultation bilaterally. Heart has regular rate and rhythm. No palpable cervical, supraclavicular, or axillary  adenopathy. Abdomen soft, non-tender, normal bowel sounds.   Lab Findings: Lab Results  Component Value Date   WBC 9.0 02/24/2022   HGB 13.4 02/24/2022   HCT 40.6 02/24/2022   MCV 88.1 02/24/2022   PLT 280 02/24/2022    Radiographic Findings: CT CHEST WO CONTRAST  Result Date: 08/13/2022 CLINICAL DATA:  Restaging non-small cell lung cancer. History of lingular neoplasm with subsequent radiation therapy. * Tracking Code: BO * EXAM: CT CHEST WITHOUT CONTRAST TECHNIQUE: Multidetector CT imaging of the chest was performed following the standard  protocol without IV contrast. RADIATION DOSE REDUCTION: This exam was performed according to the departmental dose-optimization program which includes automated exposure control, adjustment of the mA and/or kV according to patient size and/or use of iterative reconstruction technique. COMPARISON:  Multiple prior imaging studies. The most recent CT scan is 01/30/2022 and the most recent PET-CT is 07/22/2021 FINDINGS: Cardiovascular: The heart is normal in size. New small pericardial effusion. Stable aortic and coronary artery calcifications. Mediastinum/Nodes: No mediastinal or hilar mass or obvious lymphadenopathy. The esophagus is grossly. Lungs/Pleura: The small cavitary lingular lesions seen on the prior CT scan is much smaller. Small fiducials are noted from the prior biopsy. I really do not see any definite measurable disease suggesting a good response to treatment. However, there is a new irregular nodular lesion just lateral and posterior to this area in the adjacent anterior aspect of the left lower lobe. There is also tenting of the fissure. This measures 12 x 10 mm on image 113/5. This was not present on the prior study. Again noted are several nodular lesions in the left upper lobe. There was a solid 15 x 10 mm nodule on the prior study which is now completely cavitary. No soft tissue nodularity is demonstrated. The other nodules are stable. New irregular density in the left upper lobe anteriorly and medially adjacent to the ascending aorta. This is likely inflammation and or atelectasis. In the right lower lobe on image 56/5 there is a 10 x 7 mm nodule medially and anteriorly. This previously measured 6 x 2 mm and is worrisome for an enlarging neoplasm. Stable surgical changes from a right upper lobe wedge resection or possible lobectomy. Stable emphysematous changes and pulmonary scarring. Some debris is noted in the trachea. Upper Abdomen: No significant upper abdominal findings. Advanced aortic  calcifications. Musculoskeletal: No breast masses, supraclavicular or axillary adenopathy. The bony thorax is intact. IMPRESSION: 1. The small cavitary lingular lesion seen on the prior CT scan is much smaller. I really do not see any definite measurable disease suggesting a good response to radiation treatment. 2. New 12 x 10 mm irregular nodular lesion in the left lower lobe worrisome for neoplasm 3. Enlarging right lower lobe pulmonary nodule worrisome for an enlarging neoplasm. 4. PET-CT may be helpful for further evaluation. 5. New irregular nodular density in the left upper lobe anteriorly and medially is likely inflammation or atelectasis. Attention on future studies is suggested. 6. No mediastinal or hilar mass or adenopathy. 7. Stable surgical changes involving the right lung. 8. Stable emphysematous changes and pulmonary scarring. 9. Stable advanced atherosclerotic disease. Aortic Atherosclerosis (ICD10-I70.0) and Emphysema (ICD10-J43.9). Electronically Signed   By: Marijo Sanes M.D.   On: 08/13/2022 11:42    Impression: The encounter diagnosis was Cancer of lingula of lung ([place]).   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  ***  Plan:  ***   *** minutes of total time was spent for this patient encounter, including preparation, face-to-face counseling with the patient and coordination of care, physical exam, and documentation of the encounter. ____________________________________  Blair Promise, PhD, MD  This document serves as a record of services personally performed by Gery Pray, MD. It was created on his behalf by Roney Mans, a trained medical scribe. The creation of this record is based on the scribe's personal observations and the  provider's statements to them. This document has been checked and approved by the attending provider.

## 2022-08-14 ENCOUNTER — Ambulatory Visit
Admission: RE | Admit: 2022-08-14 | Discharge: 2022-08-14 | Disposition: A | Discharge status: Home / Self Care | Payer: Medicare HMO | Source: Ambulatory Visit | Attending: Radiation Oncology | Admitting: Radiation Oncology

## 2022-08-14 VITALS — BP 157/76 | HR 86 | Temp 97.5°F | Resp 24 | Ht 64.0 in | Wt 103.6 lb

## 2022-08-14 DIAGNOSIS — I700 Atherosclerosis of aorta: Secondary | ICD-10-CM | POA: Diagnosis not present

## 2022-08-14 DIAGNOSIS — C3411 Malignant neoplasm of upper lobe, right bronchus or lung: Secondary | ICD-10-CM | POA: Diagnosis not present

## 2022-08-14 DIAGNOSIS — J439 Emphysema, unspecified: Secondary | ICD-10-CM | POA: Diagnosis not present

## 2022-08-14 DIAGNOSIS — Z7951 Long term (current) use of inhaled steroids: Secondary | ICD-10-CM | POA: Diagnosis not present

## 2022-08-14 DIAGNOSIS — Z79899 Other long term (current) drug therapy: Secondary | ICD-10-CM | POA: Diagnosis not present

## 2022-08-14 DIAGNOSIS — C3412 Malignant neoplasm of upper lobe, left bronchus or lung: Secondary | ICD-10-CM | POA: Diagnosis not present

## 2022-08-14 DIAGNOSIS — I3139 Other pericardial effusion (noninflammatory): Secondary | ICD-10-CM | POA: Insufficient documentation

## 2022-08-14 DIAGNOSIS — C3410 Malignant neoplasm of upper lobe, unspecified bronchus or lung: Secondary | ICD-10-CM

## 2022-08-14 DIAGNOSIS — Z87891 Personal history of nicotine dependence: Secondary | ICD-10-CM | POA: Diagnosis not present

## 2022-08-14 DIAGNOSIS — Z923 Personal history of irradiation: Secondary | ICD-10-CM | POA: Diagnosis not present

## 2022-08-14 NOTE — Progress Notes (Signed)
Jessica Gilbert is here today for follow up post radiation to the lung.  Lung Side: Right, patient completed treatment on 04/04/22.  Does the patient complain of any of the following: Pain: Reports an occasional stinging pain under her left breast. States it will occur randomly, but then resolves fairly quickly on it's own Shortness of breath w/wo exertion: Reports SOB with activity Cough: Reports an occasional productive cough. Reports sputum is off-white color Hemoptysis: Denies Pain with swallowing: Denies Swallowing/choking concerns: Denies Appetite: Reports a stable appetite Wt Readings from Last 3 Encounters:  08/14/22 103 lb 9.6 oz (47 kg)  05/12/22 104 lb (47.2 kg)  03/06/22 108 lb (49 kg)   Energy Level: Continues to deal with fatigue and tiring easily  Post radiation skin Changes: Denies  Additional comments if applicable: CT chest w/o Contrast done 08/12/2022. Scheduled for F/U with her medical oncologist Dr. Chryl Heck in July

## 2022-08-15 ENCOUNTER — Telehealth: Payer: Self-pay | Admitting: *Deleted

## 2022-08-15 ENCOUNTER — Other Ambulatory Visit: Payer: Self-pay | Admitting: Radiation Oncology

## 2022-08-15 ENCOUNTER — Other Ambulatory Visit: Payer: Self-pay | Admitting: Radiology

## 2022-08-15 DIAGNOSIS — C3411 Malignant neoplasm of upper lobe, right bronchus or lung: Secondary | ICD-10-CM

## 2022-08-15 NOTE — Telephone Encounter (Signed)
CALLED PATIENT TO INFORM OF PET SCAN FOR 09-01-22 - ARRIVAL TIME- 11 AM, PATIENT TO HAVE WATER ONLY- 6 HRS. PRIOR TO TEST, PATIENT TO RECEIVE RESULTS FROM DR. KINARD ON 09-04-22 @ 9:30 AM, SPOKE WITH PATIENT AND SHE IS AWARE OF THIS SCAN AND THE FU AND THE INSTRUCTIONS

## 2022-08-15 NOTE — Telephone Encounter (Signed)
XXXX 

## 2022-09-01 ENCOUNTER — Encounter (HOSPITAL_COMMUNITY)
Admission: RE | Admit: 2022-09-01 | Discharge: 2022-09-01 | Disposition: A | Discharge status: Home / Self Care | Payer: Medicare HMO | Source: Ambulatory Visit | Attending: Radiology | Admitting: Radiology

## 2022-09-01 DIAGNOSIS — C3411 Malignant neoplasm of upper lobe, right bronchus or lung: Secondary | ICD-10-CM | POA: Diagnosis not present

## 2022-09-01 DIAGNOSIS — C3490 Malignant neoplasm of unspecified part of unspecified bronchus or lung: Secondary | ICD-10-CM | POA: Diagnosis not present

## 2022-09-01 DIAGNOSIS — R911 Solitary pulmonary nodule: Secondary | ICD-10-CM | POA: Diagnosis not present

## 2022-09-01 LAB — GLUCOSE, CAPILLARY: Glucose-Capillary: 110 mg/dL — ABNORMAL HIGH (ref 70–99)

## 2022-09-01 MED ORDER — FLUDEOXYGLUCOSE F - 18 (FDG) INJECTION
5.0000 | Freq: Once | INTRAVENOUS | Status: AC
  Administered 2022-09-01: 4.98 via INTRAVENOUS

## 2022-09-03 NOTE — Progress Notes (Signed)
Radiation Oncology         [phone] ________________________________  Name: Jessica Gilbert MRN: [id number]  Date: 09/04/2022  DOB: 1945-11-15  Follow-Up Visit Note  CC: Faustino Congress, NP  Collene Gobble, MD  No diagnosis found.  Diagnosis: The encounter diagnosis was Cancer of lingula of lung ([place]).   Non-small cell carcinoma of the lingula - nodule of the posterior lingula, Stage IA2 (cT1b, cN0, cM0)   History of non-small cell carcinoma of the RUL s/p lobectomy in 2005, and history of recurrent breast cancer s/p lumpectomy   Interval Since Last Radiation: 5 months and 1 day  Intent: Curative  Radiation Treatment Dates: 03/24/2022 through 04/04/2022 Site Technique Total Dose (Gy) Dose per Fx (Gy) Completed Fx Beam Energies  Lung, Left: Lung_L IMRT 45/45 4.5 10/10 6XFFF   Narrative:  The patient returns today for routine follow-up and to review recent imaging. She was last seen here for follow-up on 08/14/22. To review from her last visit, the patient had a recent CT scan at that time which showed a new left lower lobe lesion and an enlarging right lower lobe pulmonary nodule  worrisome for neoplasms.   In light of these findings, I recommended a PET scan for further evaluation of these areas. Subsequent restaging PET scan on 09/01/22 showed: a hypermetabolic subpleural nodule within the paravertebral right middle lobe (SUV max of 4.19) concerning for a small pulmonary metastasis versus new primary neoplasm; the new small LLL nodule with an SUV max of 1.30 (indeterminate for malignancy); no change or significant FDG uptake in the left apex cavitary nodule; and several additional nodules within the left lung which are technically too small to characterize. PET otherwise showed no evidence of metastatic disease.   ***                              Allergies:  is allergic to aspirin, codeine, and vitamin d analogs.  Meds: Current Outpatient Medications  Medication Sig  Dispense Refill   diphenhydrAMINE (BENADRYL) 25 MG tablet Take 25 mg by mouth at bedtime.     fluticasone (FLONASE SENSIMIST) 27.5 MCG/SPRAY nasal spray Place 1 spray into the nose daily.     guaiFENesin (MUCINEX) 600 MG 12 hr tablet Take 600 mg by mouth 2 (two) times daily as needed for to loosen phlegm.     ibuprofen (ADVIL,MOTRIN) 200 MG tablet Take 200 mg by mouth every 6 (six) hours as needed for moderate pain.     mometasone-formoterol (DULERA) 200-5 MCG/ACT AERO Inhale 2 puffs into the lungs 2 (two) times daily. 1 each 5   Multiple Vitamin (MULTIVITAMIN WITH MINERALS) TABS tablet Take 1 tablet by mouth daily.     telmisartan (MICARDIS) 40 MG tablet Take 40 mg by mouth daily.     tetrahydrozoline-zinc (VISINE-AC) 0.05-0.25 % ophthalmic solution Place 1 drop into both eyes 3 (three) times daily as needed (dry eyes).     No current facility-administered medications for this encounter.    Physical Findings: The patient is in no acute distress. Patient is alert and oriented.  vitals were not taken for this visit. .  No significant changes. Lungs are clear to auscultation bilaterally. Heart has regular rate and rhythm. No palpable cervical, supraclavicular, or axillary adenopathy. Abdomen soft, non-tender, normal bowel sounds.   Lab Findings: Lab Results  Component Value Date   WBC 9.0 02/24/2022   HGB 13.4 02/24/2022   HCT 40.6  02/24/2022   MCV 88.1 02/24/2022   PLT 280 02/24/2022    Radiographic Findings: NM PET Image Restag (PS) Skull Base To Thigh  Result Date: 09/02/2022 CLINICAL DATA:  Subsequent treatment strategy for non-small cell lung cancer. Enlarging right lower lobe pulmonary nodule. EXAM: NUCLEAR MEDICINE PET SKULL BASE TO THIGH TECHNIQUE: 4.98 mCi F-18 FDG was injected intravenously. Full-ring PET imaging was performed from the skull base to thigh after the radiotracer. CT data was obtained and used for attenuation correction and anatomic localization. Fasting blood  glucose: 110 mg/dl COMPARISON:  CT chest 08/12/2022 and PET-CT 07/22/2021 FINDINGS: Mediastinal blood pool activity: SUV max 2.16 Liver activity: SUV max NA NECK: No hypermetabolic lymph nodes in the neck. Incidental CT findings: None. CHEST: No tracer avid mediastinal, hilar, supraclavicular, or axillary lymph nodes. Status post right upper lobectomy. -Within the paravertebral right middle lobe there is a subpleural nodule measuring 1.1 cm with SUV max of 4.19, image 26/7. -Subpleural nodule in the medial left apex is too small to characterize measuring 7 mm with SUV max of 2.08, image 16/7 -Focal area of scar/architectural distortion within the anteromedial left upper lobe is again seen, with SUV max of 1.43, image 41/7. -recently characterized new nodule within the anterolateral left lower lobe measures 10 mm and with SUV max of 1.30, image 54/7. -The cavitary nodule within the left apex measures 1.4 x 1.0 cm with SUV max of 1.52, image 10/7. Unchanged in size from recent CT. -Also in the left apex is a tiny nodule measuring 5 mm, is too small to characterize with SUV max of 0.77, image 14/7. Incidental CT findings: Aortic atherosclerosis. Fat coronary artery calcifications. No significant pericardial effusion. Advanced changes emphysema with bullous changes in the right upper lung. ABDOMEN/PELVIS: No abnormal hypermetabolic activity within the liver, pancreas, adrenal glands, or spleen. No hypermetabolic lymph nodes in the abdomen or pelvis. Incidental CT findings: Aortic atherosclerotic calcifications. Moderate stool burden identified within the colon. SKELETON: No focal hypermetabolic activity to suggest skeletal metastasis. Incidental CT findings: None. IMPRESSION: 1. There is a subpleural nodule within the paravertebral right middle lobe which is hypermetabolic. SUV max 4.19. This is concerning for a small pulmonary metastasis versus new primary neoplasm. 2. The new nodule described on CT from 08/12/2022, in  the left lower lobe is similar to the previous exam with SUV max of 1.30. Given the small size of this nodule findings are indeterminate for malignancy and interval surveillance is advised. 3. The cavitary nodule within the left apex is unchanged from recent exam. No significant FDG uptake identified within this nodule. 4. There are several additional nodules identified within the left lung which are technically too small to characterize. Advise continued interval surveillance to assess for any growth in these indeterminate nodules. 5. No evidence for metastatic disease. 6. Aortic Atherosclerosis (ICD10-I70.0) and Emphysema (ICD10-J43.9). Electronically Signed   By: Kerby Moors M.D.   On: 09/02/2022 19:23   CT CHEST WO CONTRAST  Result Date: 08/13/2022 CLINICAL DATA:  Restaging non-small cell lung cancer. History of lingular neoplasm with subsequent radiation therapy. * Tracking Code: BO * EXAM: CT CHEST WITHOUT CONTRAST TECHNIQUE: Multidetector CT imaging of the chest was performed following the standard protocol without IV contrast. RADIATION DOSE REDUCTION: This exam was performed according to the departmental dose-optimization program which includes automated exposure control, adjustment of the mA and/or kV according to patient size and/or use of iterative reconstruction technique. COMPARISON:  Multiple prior imaging studies. The most recent CT scan is  01/30/2022 and the most recent PET-CT is 07/22/2021 FINDINGS: Cardiovascular: The heart is normal in size. New small pericardial effusion. Stable aortic and coronary artery calcifications. Mediastinum/Nodes: No mediastinal or hilar mass or obvious lymphadenopathy. The esophagus is grossly. Lungs/Pleura: The small cavitary lingular lesions seen on the prior CT scan is much smaller. Small fiducials are noted from the prior biopsy. I really do not see any definite measurable disease suggesting a good response to treatment. However, there is a new irregular  nodular lesion just lateral and posterior to this area in the adjacent anterior aspect of the left lower lobe. There is also tenting of the fissure. This measures 12 x 10 mm on image 113/5. This was not present on the prior study. Again noted are several nodular lesions in the left upper lobe. There was a solid 15 x 10 mm nodule on the prior study which is now completely cavitary. No soft tissue nodularity is demonstrated. The other nodules are stable. New irregular density in the left upper lobe anteriorly and medially adjacent to the ascending aorta. This is likely inflammation and or atelectasis. In the right lower lobe on image 56/5 there is a 10 x 7 mm nodule medially and anteriorly. This previously measured 6 x 2 mm and is worrisome for an enlarging neoplasm. Stable surgical changes from a right upper lobe wedge resection or possible lobectomy. Stable emphysematous changes and pulmonary scarring. Some debris is noted in the trachea. Upper Abdomen: No significant upper abdominal findings. Advanced aortic calcifications. Musculoskeletal: No breast masses, supraclavicular or axillary adenopathy. The bony thorax is intact. IMPRESSION: 1. The small cavitary lingular lesion seen on the prior CT scan is much smaller. I really do not see any definite measurable disease suggesting a good response to radiation treatment. 2. New 12 x 10 mm irregular nodular lesion in the left lower lobe worrisome for neoplasm 3. Enlarging right lower lobe pulmonary nodule worrisome for an enlarging neoplasm. 4. PET-CT may be helpful for further evaluation. 5. New irregular nodular density in the left upper lobe anteriorly and medially is likely inflammation or atelectasis. Attention on future studies is suggested. 6. No mediastinal or hilar mass or adenopathy. 7. Stable surgical changes involving the right lung. 8. Stable emphysematous changes and pulmonary scarring. 9. Stable advanced atherosclerotic disease. Aortic Atherosclerosis  (ICD10-I70.0) and Emphysema (ICD10-J43.9). Electronically Signed   By: Marijo Sanes M.D.   On: 08/13/2022 11:42    Impression: The encounter diagnosis was Cancer of lingula of lung ([place]).   Non-small cell carcinoma of the lingula - nodule of the posterior lingula, Stage IA2 (cT1b, cN0, cM0)   History of non-small cell carcinoma of the RUL s/p lobectomy in 2005, and history of recurrent breast cancer s/p lumpectomy  The patient is recovering from the effects of radiation.  ***  Plan:  ***   *** minutes of total time was spent for this patient encounter, including preparation, face-to-face counseling with the patient and coordination of care, physical exam, and documentation of the encounter. ____________________________________  Blair Promise, PhD, MD  This document serves as a record of services personally performed by Gery Pray, MD. It was created on his behalf by Roney Mans, a trained medical scribe. The creation of this record is based on the scribe's personal observations and the provider's statements to them. This document has been checked and approved by the attending provider.

## 2022-09-04 ENCOUNTER — Ambulatory Visit
Admission: RE | Admit: 2022-09-04 | Discharge: 2022-09-04 | Disposition: A | Discharge status: Home / Self Care | Payer: Medicare HMO | Source: Ambulatory Visit | Attending: Radiation Oncology | Admitting: Radiation Oncology

## 2022-09-04 ENCOUNTER — Encounter: Payer: Self-pay | Admitting: Radiation Oncology

## 2022-09-04 VITALS — BP 147/70 | HR 81 | Temp 98.1°F | Resp 18 | Ht 64.0 in | Wt 104.4 lb

## 2022-09-04 DIAGNOSIS — I700 Atherosclerosis of aorta: Secondary | ICD-10-CM | POA: Insufficient documentation

## 2022-09-04 DIAGNOSIS — Z853 Personal history of malignant neoplasm of breast: Secondary | ICD-10-CM | POA: Insufficient documentation

## 2022-09-04 DIAGNOSIS — J439 Emphysema, unspecified: Secondary | ICD-10-CM | POA: Diagnosis not present

## 2022-09-04 DIAGNOSIS — Z7951 Long term (current) use of inhaled steroids: Secondary | ICD-10-CM | POA: Diagnosis not present

## 2022-09-04 DIAGNOSIS — Z79899 Other long term (current) drug therapy: Secondary | ICD-10-CM | POA: Insufficient documentation

## 2022-09-04 DIAGNOSIS — C3411 Malignant neoplasm of upper lobe, right bronchus or lung: Secondary | ICD-10-CM | POA: Diagnosis not present

## 2022-09-04 DIAGNOSIS — Z87891 Personal history of nicotine dependence: Secondary | ICD-10-CM | POA: Diagnosis not present

## 2022-09-04 DIAGNOSIS — I3139 Other pericardial effusion (noninflammatory): Secondary | ICD-10-CM | POA: Diagnosis not present

## 2022-09-04 DIAGNOSIS — Z923 Personal history of irradiation: Secondary | ICD-10-CM | POA: Insufficient documentation

## 2022-09-04 DIAGNOSIS — C3410 Malignant neoplasm of upper lobe, unspecified bronchus or lung: Secondary | ICD-10-CM

## 2022-09-04 DIAGNOSIS — C3412 Malignant neoplasm of upper lobe, left bronchus or lung: Secondary | ICD-10-CM | POA: Diagnosis not present

## 2022-09-04 NOTE — Progress Notes (Signed)
Jessica Gilbert is here today for follow up post radiation to the lung.  Lung Side: Right,patient completed treatment on 04/04/22.   Does the patient complain of any of the following: Pain:No Shortness of breath w/wo exertion: Yes, mostly on exertion.  Cough: Yes, productive cough at times.  Hemoptysis: No Pain with swallowing: No Swallowing/choking concerns: No Appetite: Good Weight:  Wt Readings from Last 3 Encounters:  09/04/22 104 lb 6.4 oz (47.4 kg)  08/14/22 103 lb 9.6 oz (47 kg)  05/12/22 104 lb (47.2 kg)   Energy Level: Low energy level.  Post radiation skin Changes: No    Additional comments if applicable:  Patient reports having a hardened lump to left clavice area. Patient just noticed area yesterday.  BP (!) 147/70 (BP Location: Left Arm, Patient Position: Sitting, Cuff Size: Normal)   Pulse 81   Temp 98.1 F (36.7 C)   Resp 18   Ht 5\' 4"  (1.626 m)   Wt 104 lb 6.4 oz (47.4 kg)   SpO2 95%   BMI 17.92 kg/m

## 2022-09-11 DIAGNOSIS — C3412 Malignant neoplasm of upper lobe, left bronchus or lung: Secondary | ICD-10-CM | POA: Diagnosis not present

## 2022-09-11 DIAGNOSIS — Z87891 Personal history of nicotine dependence: Secondary | ICD-10-CM | POA: Diagnosis not present

## 2022-09-16 ENCOUNTER — Other Ambulatory Visit: Payer: Self-pay

## 2022-09-16 ENCOUNTER — Ambulatory Visit
Admission: RE | Admit: 2022-09-16 | Discharge: 2022-09-16 | Disposition: A | Discharge status: Home / Self Care | Payer: Medicare HMO | Source: Ambulatory Visit | Attending: Radiation Oncology | Admitting: Radiation Oncology

## 2022-09-16 DIAGNOSIS — C50512 Malignant neoplasm of lower-outer quadrant of left female breast: Secondary | ICD-10-CM | POA: Diagnosis not present

## 2022-09-16 DIAGNOSIS — Z510 Encounter for antineoplastic radiation therapy: Secondary | ICD-10-CM | POA: Insufficient documentation

## 2022-09-16 DIAGNOSIS — C3412 Malignant neoplasm of upper lobe, left bronchus or lung: Secondary | ICD-10-CM | POA: Diagnosis not present

## 2022-09-16 DIAGNOSIS — Z87891 Personal history of nicotine dependence: Secondary | ICD-10-CM | POA: Diagnosis not present

## 2022-09-16 DIAGNOSIS — C3411 Malignant neoplasm of upper lobe, right bronchus or lung: Secondary | ICD-10-CM

## 2022-09-23 DIAGNOSIS — C50512 Malignant neoplasm of lower-outer quadrant of left female breast: Secondary | ICD-10-CM | POA: Diagnosis not present

## 2022-09-23 DIAGNOSIS — Z87891 Personal history of nicotine dependence: Secondary | ICD-10-CM | POA: Diagnosis not present

## 2022-09-23 DIAGNOSIS — C3412 Malignant neoplasm of upper lobe, left bronchus or lung: Secondary | ICD-10-CM | POA: Diagnosis not present

## 2022-09-23 DIAGNOSIS — Z510 Encounter for antineoplastic radiation therapy: Secondary | ICD-10-CM | POA: Diagnosis not present

## 2022-09-24 DIAGNOSIS — C3412 Malignant neoplasm of upper lobe, left bronchus or lung: Secondary | ICD-10-CM | POA: Diagnosis not present

## 2022-09-24 DIAGNOSIS — Z87891 Personal history of nicotine dependence: Secondary | ICD-10-CM | POA: Diagnosis not present

## 2022-09-25 ENCOUNTER — Telehealth: Payer: Self-pay | Admitting: Emergency Medicine

## 2022-09-25 NOTE — Telephone Encounter (Signed)
Patient states has been diagnosed with lung cancer. Would like to discuss with Dr. Delton Coombes. Patient phone number is [phone].

## 2022-09-25 NOTE — Telephone Encounter (Signed)
Spoke with pt who has many questions about 2nd lung cancer diagnoses. Pt was scheduled for OV with Dr. Delton Coombes on 09/26/22. Nothing further needed at this time.

## 2022-09-26 ENCOUNTER — Encounter: Payer: Self-pay | Admitting: Emergency Medicine

## 2022-09-26 ENCOUNTER — Ambulatory Visit: Payer: Medicare HMO | Admitting: Emergency Medicine

## 2022-09-26 VITALS — BP 126/74 | HR 91 | Temp 98.2°F | Ht 64.0 in | Wt 104.6 lb

## 2022-09-26 DIAGNOSIS — R918 Other nonspecific abnormal finding of lung field: Secondary | ICD-10-CM

## 2022-09-26 DIAGNOSIS — J438 Other emphysema: Secondary | ICD-10-CM

## 2022-09-26 NOTE — Assessment & Plan Note (Signed)
I have tried to change her over to alternatives to [facility] but she does not tolerate most.  Has not tolerated LAMA or powdered inhalers.  We will continue the [facility] for now

## 2022-09-26 NOTE — Patient Instructions (Signed)
We discussed your CT scan of the chest and your PET scan today. We could consider performing navigational bronchoscopy to biopsy your nodule in the right lower lobe, left lower lobe.  I do believe that this carries some risk, but could give information regarding possible lung cancer, type of lung cancer and other cons of treatment (chemotherapy, immunotherapy, etc.). Based on our discussion of the pros and cons of bronchoscopy versus targeted radiation therapy to your right lower lobe nodule, I believe targeted radiation makes the most sense.  Please continue to follow with Dr. Roselind Messier as planned to get this done. Continue Dulera 2 puffs twice a day as you have been taking it.  Rinse and gargle after using. Keep albuterol available to use 2 puffs when needed for shortness of breath, chest tightness, wheezing. Follow with Dr Delton Coombes in 6 months or sooner if you have any problems

## 2022-09-26 NOTE — Assessment & Plan Note (Signed)
Recent surveillance imaging shows a hypermetabolic right lower lobe nodule, enlarging, likely primary malignancy.  She also has a less definitive left lower lobe nodule that we will need to be followed.  She has discussed possible empiric SBRT with Dr. Roselind Messier.  He is willing to offer this.  She wanted to talk to me about whether there is anything else to be done.  I did explain to her that if we did navigational bronchoscopy, establish a tissue type of her right lower lobe nodule, it could potentially be beneficial with regard to molecular studies for any future treatment.  That said unclear that the molecular characteristics of this nodule would necessarily translate to any future nodules.  In the end I recommended that she go ahead with SBRT as Dr. Roselind Messier has recommended.  She is somewhat worried about the potential effects of radiation on her breathing.  Explained to her that she does have severe COPD and there is potential that her dyspnea could worsen although usually SBRT is well-tolerated.   We discussed your CT scan of the chest and your PET scan today. We could consider performing navigational bronchoscopy to biopsy your nodule in the right lower lobe, left lower lobe.  I do believe that this carries some risk, but could give information regarding possible lung cancer, type of lung cancer and other cons of treatment (chemotherapy, immunotherapy, etc.). Based on our discussion of the pros and cons of bronchoscopy versus targeted radiation therapy to your right lower lobe nodule, I believe targeted radiation makes the most sense.  Please continue to follow with Dr. Roselind Messier as planned to get this done. Keep albuterol available to use 2 puffs when needed for shortness of breath, chest tightness, wheezing. Follow with Dr Delton Coombes in 6 months or sooner if you have any problems

## 2022-09-26 NOTE — Progress Notes (Signed)
Subjective:    Patient ID: Jessica Gilbert, female    DOB: Oct 01, 1945, 77 y.o.   MRN: [id number]  HPI  ROV 03/05/22 --[name] is 40, former smoker with a history of breast cancer.  She has also had a right upper lobe non-small cell lung cancer status post lobectomy 2005.  I saw her for slowly enlarging pulmonary nodular disease including a cavitary apical left upper lobe nodule, spiculated lingular nodule.  The lingular nodule had increased in size and was hypermetabolic on PET scan.  She underwent navigational bronchoscopy on 02/24/2022.  The lingular nodule was consistent with non-small cell lung cancer, the more apical nodule was negative (also negative on PET scan).  Unfortunately the cellblock was not cellular and ancillary studies could not be performed.  She has appointment to see Dr. Roselind Messier tomorrow. She had a lot of sore throat, was coughing w some hemoptysis - now resolved. She asks today if she is a candidate for resection - I believe her PFT preclude this.   ROV 09/26/2022 --follow-up visit for [name].  She is 53, has a history of severe obstructive lung disease, non-small cell lung cancers: Right upper lobe lobectomy 2005, lingular non-small cell lung cancer (02/2022) treated with SBRT with apparently good results.  She follows with Dr. Roselind Messier.  Her surveillance imaging has shown an enlarging hypermetabolic nodule in the right lower lobe.  There is also a new 12 mm irregular nodular lesion in the left lower lobe that was not hypermetabolic on PET scan done 09/01/2022 (indeterminate).  Currently on Dulera, uses Flonase, Mucinex. She is concerned about doing more XRT, how it would affect her functional capacity. She has tried and failed trelegy, breztri, and others without any benefit. She has albuterol, uses few times a week.    Review of Systems As per HPI  Past Medical History:  Diagnosis Date   Acute respiratory failure    Breast cancer    left breast   Carotid stenosis,  right    Celiac disease    Dyspnea    Emphysema lung    Factor 5 Leiden mutation, heterozygous    States no symptoms   History of hiatal hernia    History of radiation therapy    Left Lung-03/24/22-04/04/22- Dr. Antony Blackbird   Human metapneumovirus pneumonia 08/2013   Hyperlipidemia    Hypertension    Lung cancer 2005   Rt upper lobe   Stroke    TIA w/o defecits     Family History  Problem Relation Age of Onset   Heart disease Father    High blood pressure Father    Congestive Heart Failure Father    Leukemia Mother    Colon polyps Mother    High blood pressure Brother    Lung cancer Brother    Heart disease Brother    Diabetes Brother    Colon polyps Sister    Breast cancer Sister    High blood pressure Sister    Emphysema Sister    Pancreatic cancer Paternal Uncle      Social History   Socioeconomic History   Marital status: Divorced    Spouse name: Not on file   Number of children: Not on file   Years of education: Not on file   Highest education level: Not on file  Occupational History   Not on file  Tobacco Use   Smoking status: Former    Packs/day: 1.00    Years: 35.00    Additional  pack years: 0.00    Total pack years: 35.00    Types: Cigarettes    Quit date: 01/29/2004    Years since quitting: 18.6   Smokeless tobacco: Never  Vaping Use   Vaping Use: Never used  Substance and Sexual Activity   Alcohol use: Not Currently   Drug use: No   Sexual activity: Not on file  Other Topics Concern   Not on file  Social History Narrative   Not on file   Social Determinants of Health   Financial Resource Strain: Not on file  Food Insecurity: Not on file  Transportation Needs: Not on file  Physical Activity: Not on file  Stress: Not on file  Social Connections: Not on file  Intimate Partner Violence: Not on file    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  No inhaled exposures to her  knowledge    Allergies  Allergen Reactions   Aspirin Other (See Comments)    "burning stomach"   Codeine Nausea Only   Vitamin D Analogs Other (See Comments)    Optical migraines      Outpatient Medications Prior to Visit  Medication Sig Dispense Refill   diphenhydrAMINE (BENADRYL) 25 MG tablet Take 25 mg by mouth at bedtime.     fluticasone (FLONASE SENSIMIST) 27.5 MCG/SPRAY nasal spray Place 1 spray into the nose daily.     guaiFENesin (MUCINEX) 600 MG 12 hr tablet Take 600 mg by mouth 2 (two) times daily as needed for to loosen phlegm.     ibuprofen (ADVIL,MOTRIN) 200 MG tablet Take 200 mg by mouth every 6 (six) hours as needed for moderate pain.     mometasone-formoterol (DULERA) 200-5 MCG/ACT AERO Inhale 2 puffs into the lungs 2 (two) times daily. 1 each 5   Multiple Vitamin (MULTIVITAMIN WITH MINERALS) TABS tablet Take 1 tablet by mouth daily.     telmisartan (MICARDIS) 40 MG tablet Take 40 mg by mouth daily.     tetrahydrozoline-zinc (VISINE-AC) 0.05-0.25 % ophthalmic solution Place 1 drop into both eyes 3 (three) times daily as needed (dry eyes).     No facility-administered medications prior to visit.        Objective:   Physical Exam  Vitals:   09/26/22 1039  BP: 126/74  Pulse: 91  Temp: 98.2 F (36.8 C)  TempSrc: Oral  SpO2: 94%  Weight: 104 lb 9.6 oz (47.4 kg)  Height: 5\' 4"  (1.626 m)   Gen: Pleasant, thin, in no distress,  normal affect  ENT: No lesions,  mouth clear,  oropharynx clear, no postnasal drip  Neck: No JVD, no stridor  Lungs: No use of accessory muscles, no crackles or wheezing on normal respiration, no wheeze on forced expiration  Cardiovascular: RRR, heart sounds normal, no murmur or gallops, no peripheral edema  Musculoskeletal: No deformities, no cyanosis or clubbing  Neuro: alert, awake, non focal  Skin: Warm, no lesions or rash     Assessment & Plan:  Pulmonary nodules Recent surveillance imaging shows a hypermetabolic right  lower lobe nodule, enlarging, likely primary malignancy.  She also has a less definitive left lower lobe nodule that we will need to be followed.  She has discussed possible empiric SBRT with Dr. Roselind Messier.  He is willing to offer this.  She wanted to talk to me about whether there is anything else to be done.  I did explain to her that if we  did navigational bronchoscopy, establish a tissue type of her right lower lobe nodule, it could potentially be beneficial with regard to molecular studies for any future treatment.  That said unclear that the molecular characteristics of this nodule would necessarily translate to any future nodules.  In the end I recommended that she go ahead with SBRT as Dr. Roselind Messier has recommended.  She is somewhat worried about the potential effects of radiation on her breathing.  Explained to her that she does have severe COPD and there is potential that her dyspnea could worsen although usually SBRT is well-tolerated.   We discussed your CT scan of the chest and your PET scan today. We could consider performing navigational bronchoscopy to biopsy your nodule in the right lower lobe, left lower lobe.  I do believe that this carries some risk, but could give information regarding possible lung cancer, type of lung cancer and other cons of treatment (chemotherapy, immunotherapy, etc.). Based on our discussion of the pros and cons of bronchoscopy versus targeted radiation therapy to your right lower lobe nodule, I believe targeted radiation makes the most sense.  Please continue to follow with Dr. Roselind Messier as planned to get this done. Keep albuterol available to use 2 puffs when needed for shortness of breath, chest tightness, wheezing. Follow with Dr Delton Coombes in 6 months or sooner if you have any problems  COPD with emphysema (HCC) I have tried to change her over to alternatives to [facility] but she does not tolerate most.  Has not tolerated LAMA or powdered inhalers.  We will continue the  [facility] for now   Levy Pupa, MD, PhD 09/26/2022, 4:53 PM [facility] [phone] or if no answer before 7:00PM call [phone] For any issues after 7:00PM please call eLink [phone]

## 2022-09-30 ENCOUNTER — Other Ambulatory Visit: Payer: Self-pay

## 2022-09-30 ENCOUNTER — Ambulatory Visit: Payer: Medicare HMO | Admitting: Radiation Oncology

## 2022-09-30 ENCOUNTER — Ambulatory Visit
Admission: RE | Admit: 2022-09-30 | Discharge: 2022-09-30 | Disposition: A | Discharge status: Home / Self Care | Payer: Medicare HMO | Source: Ambulatory Visit | Attending: Radiation Oncology | Admitting: Radiation Oncology

## 2022-09-30 DIAGNOSIS — C50512 Malignant neoplasm of lower-outer quadrant of left female breast: Secondary | ICD-10-CM | POA: Diagnosis not present

## 2022-09-30 DIAGNOSIS — Z87891 Personal history of nicotine dependence: Secondary | ICD-10-CM | POA: Diagnosis not present

## 2022-09-30 DIAGNOSIS — Z510 Encounter for antineoplastic radiation therapy: Secondary | ICD-10-CM | POA: Diagnosis not present

## 2022-09-30 DIAGNOSIS — C3412 Malignant neoplasm of upper lobe, left bronchus or lung: Secondary | ICD-10-CM | POA: Diagnosis not present

## 2022-09-30 DIAGNOSIS — R918 Other nonspecific abnormal finding of lung field: Secondary | ICD-10-CM

## 2022-09-30 LAB — RAD ONC ARIA SESSION SUMMARY
Course Elapsed Days: 0
Plan Fractions Treated to Date: 1
Plan Prescribed Dose Per Fraction: 5 Gy
Plan Total Fractions Prescribed: 10
Plan Total Prescribed Dose: 50 Gy
Reference Point Dosage Given to Date: 5 Gy
Reference Point Session Dosage Given: 5 Gy
Session Number: 1

## 2022-10-01 ENCOUNTER — Other Ambulatory Visit: Payer: Self-pay

## 2022-10-01 ENCOUNTER — Ambulatory Visit
Admission: RE | Admit: 2022-10-01 | Discharge: 2022-10-01 | Disposition: A | Discharge status: Home / Self Care | Payer: Medicare HMO | Source: Ambulatory Visit | Attending: Radiation Oncology | Admitting: Radiation Oncology

## 2022-10-01 DIAGNOSIS — C3412 Malignant neoplasm of upper lobe, left bronchus or lung: Secondary | ICD-10-CM | POA: Diagnosis not present

## 2022-10-01 DIAGNOSIS — C50512 Malignant neoplasm of lower-outer quadrant of left female breast: Secondary | ICD-10-CM | POA: Diagnosis not present

## 2022-10-01 DIAGNOSIS — Z510 Encounter for antineoplastic radiation therapy: Secondary | ICD-10-CM | POA: Diagnosis not present

## 2022-10-01 DIAGNOSIS — Z87891 Personal history of nicotine dependence: Secondary | ICD-10-CM | POA: Diagnosis not present

## 2022-10-01 LAB — RAD ONC ARIA SESSION SUMMARY
Course Elapsed Days: 1
Plan Fractions Treated to Date: 2
Plan Prescribed Dose Per Fraction: 5 Gy
Plan Total Fractions Prescribed: 10
Plan Total Prescribed Dose: 50 Gy
Reference Point Dosage Given to Date: 10 Gy
Reference Point Session Dosage Given: 5 Gy
Session Number: 2

## 2022-10-02 ENCOUNTER — Ambulatory Visit: Payer: Medicare HMO | Admitting: Radiation Oncology

## 2022-10-02 ENCOUNTER — Other Ambulatory Visit: Payer: Self-pay

## 2022-10-02 ENCOUNTER — Ambulatory Visit
Admission: RE | Admit: 2022-10-02 | Discharge: 2022-10-02 | Disposition: A | Discharge status: Home / Self Care | Payer: Medicare HMO | Source: Ambulatory Visit | Attending: Radiation Oncology | Admitting: Radiation Oncology

## 2022-10-02 DIAGNOSIS — Z510 Encounter for antineoplastic radiation therapy: Secondary | ICD-10-CM | POA: Diagnosis not present

## 2022-10-02 DIAGNOSIS — C3412 Malignant neoplasm of upper lobe, left bronchus or lung: Secondary | ICD-10-CM | POA: Diagnosis not present

## 2022-10-02 DIAGNOSIS — C50512 Malignant neoplasm of lower-outer quadrant of left female breast: Secondary | ICD-10-CM | POA: Diagnosis not present

## 2022-10-02 DIAGNOSIS — Z87891 Personal history of nicotine dependence: Secondary | ICD-10-CM | POA: Diagnosis not present

## 2022-10-02 LAB — RAD ONC ARIA SESSION SUMMARY
Course Elapsed Days: 2
Plan Fractions Treated to Date: 3
Plan Prescribed Dose Per Fraction: 5 Gy
Plan Total Fractions Prescribed: 10
Plan Total Prescribed Dose: 50 Gy
Reference Point Dosage Given to Date: 15 Gy
Reference Point Session Dosage Given: 5 Gy
Session Number: 3

## 2022-10-03 ENCOUNTER — Other Ambulatory Visit: Payer: Self-pay

## 2022-10-03 ENCOUNTER — Ambulatory Visit
Admission: RE | Admit: 2022-10-03 | Discharge: 2022-10-03 | Disposition: A | Discharge status: Home / Self Care | Payer: Medicare HMO | Source: Ambulatory Visit | Attending: Radiation Oncology | Admitting: Radiation Oncology

## 2022-10-03 DIAGNOSIS — Z87891 Personal history of nicotine dependence: Secondary | ICD-10-CM | POA: Diagnosis not present

## 2022-10-03 DIAGNOSIS — Z510 Encounter for antineoplastic radiation therapy: Secondary | ICD-10-CM | POA: Diagnosis not present

## 2022-10-03 DIAGNOSIS — C3412 Malignant neoplasm of upper lobe, left bronchus or lung: Secondary | ICD-10-CM | POA: Diagnosis not present

## 2022-10-03 DIAGNOSIS — C50512 Malignant neoplasm of lower-outer quadrant of left female breast: Secondary | ICD-10-CM | POA: Diagnosis not present

## 2022-10-03 LAB — RAD ONC ARIA SESSION SUMMARY
Course Elapsed Days: 3
Plan Fractions Treated to Date: 4
Plan Prescribed Dose Per Fraction: 5 Gy
Plan Total Fractions Prescribed: 10
Plan Total Prescribed Dose: 50 Gy
Reference Point Dosage Given to Date: 20 Gy
Reference Point Session Dosage Given: 5 Gy
Session Number: 4

## 2022-10-06 ENCOUNTER — Ambulatory Visit
Admission: RE | Admit: 2022-10-06 | Discharge: 2022-10-06 | Disposition: A | Discharge status: Home / Self Care | Payer: Medicare HMO | Source: Ambulatory Visit | Attending: Radiation Oncology | Admitting: Radiation Oncology

## 2022-10-06 ENCOUNTER — Other Ambulatory Visit: Payer: Self-pay

## 2022-10-06 DIAGNOSIS — Z87891 Personal history of nicotine dependence: Secondary | ICD-10-CM | POA: Diagnosis not present

## 2022-10-06 DIAGNOSIS — C50512 Malignant neoplasm of lower-outer quadrant of left female breast: Secondary | ICD-10-CM | POA: Diagnosis not present

## 2022-10-06 DIAGNOSIS — Z510 Encounter for antineoplastic radiation therapy: Secondary | ICD-10-CM | POA: Diagnosis not present

## 2022-10-06 DIAGNOSIS — C3412 Malignant neoplasm of upper lobe, left bronchus or lung: Secondary | ICD-10-CM | POA: Diagnosis not present

## 2022-10-06 LAB — RAD ONC ARIA SESSION SUMMARY
Course Elapsed Days: 6
Plan Fractions Treated to Date: 5
Plan Prescribed Dose Per Fraction: 5 Gy
Plan Total Fractions Prescribed: 10
Plan Total Prescribed Dose: 50 Gy
Reference Point Dosage Given to Date: 25 Gy
Reference Point Session Dosage Given: 5 Gy
Session Number: 5

## 2022-10-07 ENCOUNTER — Ambulatory Visit
Admission: RE | Admit: 2022-10-07 | Discharge: 2022-10-07 | Disposition: A | Discharge status: Home / Self Care | Payer: Medicare HMO | Source: Ambulatory Visit | Attending: Radiation Oncology | Admitting: Radiation Oncology

## 2022-10-07 ENCOUNTER — Ambulatory Visit: Payer: Medicare HMO | Admitting: Radiation Oncology

## 2022-10-07 ENCOUNTER — Other Ambulatory Visit: Payer: Self-pay

## 2022-10-07 DIAGNOSIS — C50512 Malignant neoplasm of lower-outer quadrant of left female breast: Secondary | ICD-10-CM | POA: Diagnosis not present

## 2022-10-07 DIAGNOSIS — Z87891 Personal history of nicotine dependence: Secondary | ICD-10-CM | POA: Diagnosis not present

## 2022-10-07 DIAGNOSIS — C3412 Malignant neoplasm of upper lobe, left bronchus or lung: Secondary | ICD-10-CM | POA: Diagnosis not present

## 2022-10-07 DIAGNOSIS — Z510 Encounter for antineoplastic radiation therapy: Secondary | ICD-10-CM | POA: Diagnosis not present

## 2022-10-07 LAB — RAD ONC ARIA SESSION SUMMARY
Course Elapsed Days: 7
Plan Fractions Treated to Date: 6
Plan Prescribed Dose Per Fraction: 5 Gy
Plan Total Fractions Prescribed: 10
Plan Total Prescribed Dose: 50 Gy
Reference Point Dosage Given to Date: 30 Gy
Reference Point Session Dosage Given: 5 Gy
Session Number: 6

## 2022-10-08 ENCOUNTER — Other Ambulatory Visit: Payer: Self-pay

## 2022-10-08 ENCOUNTER — Ambulatory Visit
Admission: RE | Admit: 2022-10-08 | Discharge: 2022-10-08 | Disposition: A | Discharge status: Home / Self Care | Payer: Medicare HMO | Source: Ambulatory Visit | Attending: Radiation Oncology | Admitting: Radiation Oncology

## 2022-10-08 DIAGNOSIS — C50512 Malignant neoplasm of lower-outer quadrant of left female breast: Secondary | ICD-10-CM | POA: Diagnosis not present

## 2022-10-08 DIAGNOSIS — Z510 Encounter for antineoplastic radiation therapy: Secondary | ICD-10-CM | POA: Diagnosis not present

## 2022-10-08 DIAGNOSIS — Z87891 Personal history of nicotine dependence: Secondary | ICD-10-CM | POA: Diagnosis not present

## 2022-10-08 DIAGNOSIS — C3412 Malignant neoplasm of upper lobe, left bronchus or lung: Secondary | ICD-10-CM | POA: Diagnosis not present

## 2022-10-08 LAB — RAD ONC ARIA SESSION SUMMARY
Course Elapsed Days: 8
Plan Fractions Treated to Date: 7
Plan Prescribed Dose Per Fraction: 5 Gy
Plan Total Fractions Prescribed: 10
Plan Total Prescribed Dose: 50 Gy
Reference Point Dosage Given to Date: 35 Gy
Reference Point Session Dosage Given: 5 Gy
Session Number: 7

## 2022-10-09 ENCOUNTER — Ambulatory Visit
Admission: RE | Admit: 2022-10-09 | Discharge: 2022-10-09 | Disposition: A | Discharge status: Home / Self Care | Payer: Medicare HMO | Source: Ambulatory Visit | Attending: Radiation Oncology | Admitting: Radiation Oncology

## 2022-10-09 ENCOUNTER — Other Ambulatory Visit: Payer: Self-pay

## 2022-10-09 ENCOUNTER — Ambulatory Visit: Payer: Medicare HMO | Admitting: Radiation Oncology

## 2022-10-09 DIAGNOSIS — Z87891 Personal history of nicotine dependence: Secondary | ICD-10-CM | POA: Diagnosis not present

## 2022-10-09 DIAGNOSIS — C50512 Malignant neoplasm of lower-outer quadrant of left female breast: Secondary | ICD-10-CM | POA: Diagnosis not present

## 2022-10-09 DIAGNOSIS — Z510 Encounter for antineoplastic radiation therapy: Secondary | ICD-10-CM | POA: Diagnosis not present

## 2022-10-09 DIAGNOSIS — C3412 Malignant neoplasm of upper lobe, left bronchus or lung: Secondary | ICD-10-CM | POA: Diagnosis not present

## 2022-10-09 LAB — RAD ONC ARIA SESSION SUMMARY
Course Elapsed Days: 9
Plan Fractions Treated to Date: 8
Plan Prescribed Dose Per Fraction: 5 Gy
Plan Total Fractions Prescribed: 10
Plan Total Prescribed Dose: 50 Gy
Reference Point Dosage Given to Date: 40 Gy
Reference Point Session Dosage Given: 5 Gy
Session Number: 8

## 2022-10-10 ENCOUNTER — Ambulatory Visit
Admission: RE | Admit: 2022-10-10 | Discharge: 2022-10-10 | Disposition: A | Discharge status: Home / Self Care | Payer: Medicare HMO | Source: Ambulatory Visit | Attending: Radiation Oncology | Admitting: Radiation Oncology

## 2022-10-10 ENCOUNTER — Other Ambulatory Visit: Payer: Self-pay

## 2022-10-10 DIAGNOSIS — C50512 Malignant neoplasm of lower-outer quadrant of left female breast: Secondary | ICD-10-CM | POA: Diagnosis not present

## 2022-10-10 DIAGNOSIS — C3412 Malignant neoplasm of upper lobe, left bronchus or lung: Secondary | ICD-10-CM | POA: Diagnosis not present

## 2022-10-10 DIAGNOSIS — Z510 Encounter for antineoplastic radiation therapy: Secondary | ICD-10-CM | POA: Diagnosis not present

## 2022-10-10 DIAGNOSIS — Z87891 Personal history of nicotine dependence: Secondary | ICD-10-CM | POA: Diagnosis not present

## 2022-10-10 LAB — RAD ONC ARIA SESSION SUMMARY
Course Elapsed Days: 10
Plan Fractions Treated to Date: 9
Plan Prescribed Dose Per Fraction: 5 Gy
Plan Total Fractions Prescribed: 10
Plan Total Prescribed Dose: 50 Gy
Reference Point Dosage Given to Date: 45 Gy
Reference Point Session Dosage Given: 5 Gy
Session Number: 9

## 2022-10-13 ENCOUNTER — Ambulatory Visit
Admission: RE | Admit: 2022-10-13 | Discharge: 2022-10-13 | Disposition: A | Discharge status: Home / Self Care | Payer: Medicare HMO | Source: Ambulatory Visit | Attending: Radiation Oncology | Admitting: Radiation Oncology

## 2022-10-13 ENCOUNTER — Other Ambulatory Visit: Payer: Self-pay

## 2022-10-13 DIAGNOSIS — Z87891 Personal history of nicotine dependence: Secondary | ICD-10-CM | POA: Diagnosis not present

## 2022-10-13 DIAGNOSIS — C3412 Malignant neoplasm of upper lobe, left bronchus or lung: Secondary | ICD-10-CM | POA: Diagnosis not present

## 2022-10-13 DIAGNOSIS — Z510 Encounter for antineoplastic radiation therapy: Secondary | ICD-10-CM | POA: Diagnosis not present

## 2022-10-13 DIAGNOSIS — C50512 Malignant neoplasm of lower-outer quadrant of left female breast: Secondary | ICD-10-CM | POA: Diagnosis not present

## 2022-10-13 LAB — RAD ONC ARIA SESSION SUMMARY
Course Elapsed Days: 13
Plan Fractions Treated to Date: 10
Plan Prescribed Dose Per Fraction: 5 Gy
Plan Total Fractions Prescribed: 10
Plan Total Prescribed Dose: 50 Gy
Reference Point Dosage Given to Date: 50 Gy
Reference Point Session Dosage Given: 5 Gy
Session Number: 10

## 2022-10-14 ENCOUNTER — Ambulatory Visit: Payer: Medicare HMO | Admitting: Radiation Oncology

## 2022-10-16 NOTE — Radiation Completion Notes (Signed)
Patient Name: Jessica Gilbert, Jessica Gilbert MRN: [id number] Date of Birth: 07-12-1945 Referring Physician: Levy Pupa, M.D. Date of Service: 2022-10-16 Radiation Oncologist: Arnette Schaumann, M.D. [facility] [place]                             RADIATION ONCOLOGY END OF TREATMENT NOTE     Diagnosis: C34.11 Malignant neoplasm of upper lobe, right bronchus or lung Staging on 2015-07-17: Malignant neoplasm of lower-outer quadrant of left breast of female, estrogen receptor positive (HCC) T=T1b, N=N0, M=cM0 Staging on 2015-06-27: Malignant neoplasm of lower-outer quadrant of left breast of female, estrogen receptor positive (HCC) T=T1b, N=N0, M=M0 Staging on 2022-03-06: Cancer of lingula of lung (HCC) T=cT1b, N=cN0, M=cM0 Intent: Curative     ==========DELIVERED PLANS==========  First Treatment Date: 2022-09-30 - Last Treatment Date: 2022-10-13   Plan Name: [id number] Site: Lung, Right Technique: IMRT Mode: Photon Dose Per Fraction: 5 Gy Prescribed Dose (Delivered / Prescribed): 50 Gy / 50 Gy Prescribed Fxs (Delivered / Prescribed): 10 / 10     ==========ON TREATMENT VISIT DATES========== 2022-09-30, 2022-10-07     ==========UPCOMING VISITS==========       ==========APPENDIX - ON TREATMENT VISIT NOTES==========   See weekly On Treatment Notes is Epic for details.

## 2022-12-16 ENCOUNTER — Other Ambulatory Visit: Payer: Self-pay

## 2022-12-16 ENCOUNTER — Inpatient Hospital Stay: Payer: Medicare HMO | Attending: Hematology and Oncology | Admitting: Hematology and Oncology

## 2022-12-16 VITALS — BP 129/77 | HR 94 | Temp 98.2°F | Resp 16 | Wt 100.1 lb

## 2022-12-16 DIAGNOSIS — Z85118 Personal history of other malignant neoplasm of bronchus and lung: Secondary | ICD-10-CM | POA: Diagnosis not present

## 2022-12-16 DIAGNOSIS — Z170 Estrogen receptor positive status [ER+]: Secondary | ICD-10-CM | POA: Diagnosis not present

## 2022-12-16 DIAGNOSIS — Z79899 Other long term (current) drug therapy: Secondary | ICD-10-CM | POA: Diagnosis not present

## 2022-12-16 DIAGNOSIS — Z923 Personal history of irradiation: Secondary | ICD-10-CM | POA: Insufficient documentation

## 2022-12-16 DIAGNOSIS — D6851 Activated protein C resistance: Secondary | ICD-10-CM | POA: Diagnosis not present

## 2022-12-16 DIAGNOSIS — C50512 Malignant neoplasm of lower-outer quadrant of left female breast: Secondary | ICD-10-CM | POA: Diagnosis not present

## 2022-12-16 DIAGNOSIS — Z9221 Personal history of antineoplastic chemotherapy: Secondary | ICD-10-CM | POA: Insufficient documentation

## 2022-12-16 DIAGNOSIS — Z902 Acquired absence of lung [part of]: Secondary | ICD-10-CM | POA: Insufficient documentation

## 2022-12-16 DIAGNOSIS — Z853 Personal history of malignant neoplasm of breast: Secondary | ICD-10-CM | POA: Diagnosis not present

## 2022-12-16 NOTE — Progress Notes (Signed)
[facility]  [phone]) [phone] [phone]) [phone]     ID: Jessica Gilbert DOB: [birth year]  MR#: [id number]  JYN#:[id number]  Patient Care Team: Moshe Cipro, NP as PCP - General (Family Medicine) Magrinat, Valentino Hue, MD (Inactive) as Consulting Physician (Oncology) Lonie Peak, MD as Attending Physician (Radiation Oncology) Griselda Miner, MD as Consulting Physician (General Surgery) Pershing Proud, RN as Oncology Nurse Navigator Donnelly Angelica, RN as Oncology Nurse Navigator  CHIEF COMPLAINT:  Estrogen receptor positive breast cancer  CURRENT TREATMENT: Observation  INTERVAL HISTORY:  Jessica Gilbert is here for follow-up.  Since her last visit she was diagnosed with lingular non-small cell carcinoma and underwent radiation.  She most recently had pet imaging which once again showed a subpleural nodule within the right paravertebral middle lobe which is hypermetabolic, new nodule in the left lower lobe which is similar in appearance and there are several additional nodules which are too small to characterize.  She once again had CT chest which showed left lower lobe irregular nodular lesion measuring 12 x 10 mm enlarging right lower lobe pulmonary nodule.  She once again had IMRT to the right lung nodule. She says she is understandably tired of her lung nodules popping up again and again and she is wondering if she needs to do some chemotherapy. She is also at the same time losing weight, lost about 30 lbs in 2 yrs. She is more SOB, and wonders if this recurrent radiation is affecting her lungs.  Rest of the pertinent 10 point ROS reviewed and negative   COVID 19 VACCINATION STATUS: Status post Moderna x4,   BREAST CANCER HISTORY: From the original intake note:   Jessica Gilbert (pronounced "ro-lund") had bilateral screening mammography at the breast Center 05/26/2014 showing a possible mass in the left breast. Left digital mammography with ultrasonography  06/14/2014 found a breast density to be category C. There was no persistent mass or distortion or malignant-type microcalcifications. . There was no palpable mass. Ultrasound showed a well circumscribed benign appearing nodule in the left breast 2:00 position measuring 4 mm. This was felt to be most likely benign but repeat ultrasound  At 6 months was recommended and thiswas performed 12/14/2014.  The mass was unchanged an incidental note was made of a benign appearing 0.5 cm lymph node in the left breast 2:00 position.   On 06/15/2015 Harriett Sine had bilateral diagnostic mammography with tomosynthesis and this time an irregular mass with spiculated margins was noted in the outer lower left breast measuring approximately 9 mm. Physical examination confirmed a firm fixed nodule at the 3:30 o'clock position an ultrasound of this area confirmed an irregular hypoechoic mass measuring 0.7 cm. There was a 0.2 cm adjacent satellite nodule. There was no left axillary lymphadenopathy.   biopsy of this mass was obtained 06/15/2015, and showed ([id number]) an invasive ductal carcinoma, grade 1, estrogen receptor 100% positive, progesterone receptor 40% positive, both with strong staining intensity, with an MIB-1 of 11%, and no HER-2 amplification, the signals ratio being 1.39 and the number per cell 2.15.   Tatasha also has a history of prior tobacco abuse, status post right upper lobectomy for lung cancer, with significant emphysema.   The patient's subsequent history is as detailed below   PAST MEDICAL HISTORY: Past Medical History:  Diagnosis Date   Acute respiratory failure (HCC)    Breast cancer (HCC)    left breast   Carotid stenosis, right    Celiac disease  Dyspnea    Emphysema lung (HCC)    Factor 5 Leiden mutation, heterozygous (HCC)    States no symptoms   History of hiatal hernia    History of radiation therapy    Left Lung-03/24/22-04/04/22- Dr. Antony Blackbird   Human metapneumovirus pneumonia  08/2013   Hyperlipidemia    Hypertension    Lung cancer (HCC) 2005   Rt upper lobe   Stroke (HCC)    TIA w/o defecits    PAST SURGICAL HISTORY: Past Surgical History:  Procedure Laterality Date   BREAST LUMPECTOMY     left    BREAST LUMPECTOMY WITH RADIOACTIVE SEED AND SENTINEL LYMPH NODE BIOPSY Left 07/17/2015   Procedure: LEFT BREAST LUMPECTOMY WITH RADIOACTIVE SEED WITH LEFT AXILLARY SENTINEL LYMPH NODE BIOPSY;  Surgeon: Ovidio Kin, MD;  Location: [facility];  Service: General;  Laterality: Left;   BREAST LUMPECTOMY WITH RADIOACTIVE SEED AND SENTINEL LYMPH NODE BIOPSY Left 05/28/2020   Procedure: LEFT BREAST LUMPECTOMY WITH RADIOACTIVE SEED AND SENTINEL LYMPH NODE BIOPSY;  Surgeon: Griselda Miner, MD;  Location: [facility];  Service: General;  Laterality: Left;   BRONCHIAL BIOPSY  02/24/2022   Procedure: BRONCHIAL BIOPSIES;  Surgeon: Leslye Peer, MD;  Location: [facility] END[facility]COPY;  Service: Pulmonary;;   BRONCHIAL BRUSHINGS  02/24/2022   Procedure: BRONCHIAL BRUSHINGS;  Surgeon: Leslye Peer, MD;  Location: [facility] ENDOSCOPY;  Service: Pulmonary;;   BRONCHIAL NEEDLE ASPIRATION BIOPSY  02/24/2022   Procedure: BRONCHIAL NEEDLE ASPIRATION BIOPSIES;  Surgeon: Leslye Peer, MD;  Location: [facility] ENDOSCOPY;  Service: Pulmonary;;   BRONCHIAL WASHINGS  02/24/2022   Procedure: BRONCHIAL WASHINGS;  Surgeon: Leslye Peer, MD;  Location: [facility] ENDOSCOPY;  Service: Pulmonary;;   FIDUCIAL MARKER PLACEMENT  02/24/2022   Procedure: FIDUCIAL MARKER PLACEMENT;  Surgeon: Leslye Peer, MD;  Location: [facility] ENDOSCOPY;  Service: Pulmonary;;   LUNG REMOVAL, PARTIAL Right 2005   Upper lobe   TRIGGER FINGER RELEASE     TUBAL LIGATION  1980   VIDEO BRONCHOSCOPY WITH RADIAL ENDOBRONCHIAL ULTRASOUND  02/24/2022   Procedure: VIDEO BRONCHOSCOPY WITH RADIAL ENDOBRONCHIAL ULTRASOUND;  Surgeon: Leslye Peer, MD;  Location: [facility] ENDOSCOPY;  Service: Pulmonary;;    FAMILY HISTORY Family  History  Problem Relation Age of Onset   Heart disease Father    High blood pressure Father    Congestive Heart Failure Father    Leukemia Mother    Colon polyps Mother    High blood pressure Brother    Lung cancer Brother    Heart disease Brother    Diabetes Brother    Colon polyps Sister    Breast cancer Sister    High blood pressure Sister    Emphysema Sister    Pancreatic cancer Paternal Uncle    the patient's father died at the age of 31 with congestive 40 failure. Patient's mother died with acute leukemia at the age of 50. The patient had one brother, 2 sisters. One sister had breast cancer at age 41. One brother, smoker, was diagnosed with lung cancer at the age of 78. There is a paternal uncle who may have had pancreatic cancer. There is no history of ovarian cancer in the family.   GYNECOLOGIC HISTORY:  No LMP recorded. Patient is postmenopausal.  menarche age 21, first live birth age 45. The patient is GX P2. She stopped having periods in 1998, took hormone replacement approximately 6 months. Remotely she took oral contraceptives about 3 years with no complications  SOCIAL HISTORY: (updated November 2022)  Preslea worked as an Airline pilot.  She describes herself is single. At home she lives by herself, with 3 cats. Her son Billi Schuhmacher lives in [place] and was recently promoted to Archivist in Patent examiner. The patient's other son died 13 years ago. Hieu has 2 grandchildren, a Engineer, maintenance (IT) who graduated from [employer] and now is working towards a PhD and a grandson who works for [employer] in [place].. She is a International aid/development worker.    ADVANCED DIRECTIVES:  In place. Her son Kathlene November is her healthcare power of attorney. He can be reached at [phone]   HEALTH MAINTENANCE: Social History   Tobacco Use   Smoking status: Former    Packs/day: 1.00    Years: 35.00    Additional pack years: 0.00    Total pack years: 35.00    Types: Cigarettes    Quit date: 01/29/2004    Years  since quitting: 18.8   Smokeless tobacco: Never  Vaping Use   Vaping Use: Never used  Substance Use Topics   Alcohol use: Not Currently   Drug use: No     Colonoscopy:  PAP:  Bone density: remote.   Lipid panel:  Allergies  Allergen Reactions   Aspirin Other (See Comments)    "burning stomach"   Codeine Nausea Only   Vitamin D Analogs Other (See Comments)    Optical migraines     Current Outpatient Medications  Medication Sig Dispense Refill   diphenhydrAMINE (BENADRYL) 25 MG tablet Take 25 mg by mouth at bedtime.     fluticasone (FLONASE SENSIMIST) 27.5 MCG/SPRAY nasal spray Place 1 spray into the nose daily.     guaiFENesin (MUCINEX) 600 MG 12 hr tablet Take 600 mg by mouth 2 (two) times daily as needed for to loosen phlegm.     ibuprofen (ADVIL,MOTRIN) 200 MG tablet Take 200 mg by mouth every 6 (six) hours as needed for moderate pain.     mometasone-formoterol (DULERA) 200-5 MCG/ACT AERO Inhale 2 puffs into the lungs 2 (two) times daily. 1 each 5   Multiple Vitamin (MULTIVITAMIN WITH MINERALS) TABS tablet Take 1 tablet by mouth daily.     telmisartan (MICARDIS) 40 MG tablet Take 40 mg by mouth daily.     tetrahydrozoline-zinc (VISINE-AC) 0.05-0.25 % ophthalmic solution Place 1 drop into both eyes 3 (three) times daily as needed (dry eyes).     No current facility-administered medications for this visit.    OBJECTIVE:   Vitals:   12/16/22 1551  BP: 129/77  Pulse: 94  Resp: 16  Temp: 98.2 F (36.8 C)  SpO2: 93%       Body mass index is 17.18 kg/m.    ECOG FS: Filed Weights   12/16/22 1551  Weight: 100 lb 1.6 oz (45.4 kg)    Physical Exam Constitutional:      Appearance: Normal appearance.  Cardiovascular:     Rate and Rhythm: Normal rate and regular rhythm.  Pulmonary:     Comments: Very poor inspiratory effort.  No adventitious sounds Chest:     Comments: Bilateral breasts inspected.  No palpable masses or regional adenopathy.  No concern for breast  cancer recurrence Musculoskeletal:     Cervical back: Normal range of motion and neck supple. No rigidity.  Lymphadenopathy:     Cervical: No cervical adenopathy.  Neurological:     Mental Status: She is alert.      LAB RESULTS:  CMP     Component Value  Date/Time   NA 140 02/24/2022 0544   NA 140 10/08/2016 1152   K 4.1 02/24/2022 0544   K 4.3 10/08/2016 1152   CL 105 02/24/2022 0544   CO2 26 02/24/2022 0544   CO2 25 10/08/2016 1152   GLUCOSE 105 (H) 02/24/2022 0544   GLUCOSE 98 10/08/2016 1152   BUN 11 02/24/2022 0544   BUN 12.4 10/08/2016 1152   CREATININE 0.87 02/24/2022 0544   CREATININE 0.81 12/12/2021 1511   CREATININE 0.8 10/08/2016 1152   CALCIUM 9.3 02/24/2022 0544   CALCIUM 9.7 10/08/2016 1152   PROT 6.9 12/12/2021 1511   PROT 7.1 10/08/2016 1152   ALBUMIN 4.3 12/12/2021 1511   ALBUMIN 4.1 10/08/2016 1152   AST 23 12/12/2021 1511   AST 23 10/08/2016 1152   ALT 20 12/12/2021 1511   ALT 18 10/08/2016 1152   ALKPHOS 61 12/12/2021 1511   ALKPHOS 75 10/08/2016 1152   BILITOT 0.3 12/12/2021 1511   BILITOT 0.42 10/08/2016 1152   GFRNONAA >60 02/24/2022 0544   GFRNONAA >60 12/12/2021 1511   GFRAA >60 01/09/2020 1503    INo results found for: "SPEP", "UPEP"  Lab Results  Component Value Date   WBC 9.0 02/24/2022   NEUTROABS 5.5 12/12/2021   HGB 13.4 02/24/2022   HCT 40.6 02/24/2022   MCV 88.1 02/24/2022   PLT 280 02/24/2022      Chemistry      Component Value Date/Time   NA 140 02/24/2022 0544   NA 140 10/08/2016 1152   K 4.1 02/24/2022 0544   K 4.3 10/08/2016 1152   CL 105 02/24/2022 0544   CO2 26 02/24/2022 0544   CO2 25 10/08/2016 1152   BUN 11 02/24/2022 0544   BUN 12.4 10/08/2016 1152   CREATININE 0.87 02/24/2022 0544   CREATININE 0.81 12/12/2021 1511   CREATININE 0.8 10/08/2016 1152      Component Value Date/Time   CALCIUM 9.3 02/24/2022 0544   CALCIUM 9.7 10/08/2016 1152   ALKPHOS 61 12/12/2021 1511   ALKPHOS 75 10/08/2016 1152    AST 23 12/12/2021 1511   AST 23 10/08/2016 1152   ALT 20 12/12/2021 1511   ALT 18 10/08/2016 1152   BILITOT 0.3 12/12/2021 1511   BILITOT 0.42 10/08/2016 1152       No results found for: "LABCA2"  No components found for: "LABCA125"  No results for input(s): "INR" in the last 168 hours.  Urinalysis No results found for: "COLORURINE", "APPEARANCEUR", "LABSPEC", "PHURINE", "GLUCOSEU", "HGBUR", "BILIRUBINUR", "KETONESUR", "PROTEINUR", "UROBILINOGEN", "NITRITE", "LEUKOCYTESUR"   STUDIES: No results found.   ASSESSMENT: 77 y.o.  [place] woman status post left breast lower outer quadrant biopsy 06/15/2015 for a clinical mT1c N0, stage IA  Invasive ductal carcinoma, grade 1, estrogen and progesterone receptor positive, HER-2 not amplified, with an MIB-1 of 11%  (1) status post left lumpectomy with sentinel lymph node sampling 07/17/2015 for a pT1b pN0, stage IA invasive ductal carcinoma, grade 2, with negative though close margins; repeat HER-2 was again negative, and estrogen and progesterone receptors were again positive  (2) Oncotype DX score of 16 predicts an outside the breast risk of recurrence within 10 years of 10% if the patient's only systemic therapy is tamoxifen for 5 years. It also her next no benefit from chemotherapy  (3)  The patient opted against adjuvant radiation  (4)  Anastrozole started 09/07/2015 discontinued May 2017 with intolerance  (a) exemestane started June 2017, discontinued August 2017 with intolerance.  (b) [facility] 11[facility]05[facility]2015 shows a T score of -2.0   (5) Factor V Leiden positive: elevated clotting risk with any procedure, immobilization, or procoagulant drugs such as estrogen  (6) s/p right upper lobectomy (after neoadjuvant chemo-radiation) August  2005 for a primary lung cancer: Non-small cell carcinoma, with a 4 cm area of tumor regression and necrosis and rare atypical cells but no obvious viable tumor remaining, and a  total of 9 regional lymph nodes (N1, and 2) all negative.  (7) status post left breast upper outer quadrant 05/01/2020 shows invasive ductal carcinoma, low-grade, estrogen and progesterone receptor positive, HER-2 not amplified, with an MIB-1 of 25%  (8) status post left lumpectomy and sentinel lymph node sampling 05/28/2020 for a pT1b pN0, stage IA invasive ductal carcinoma, grade 2, with negative margins.  (a) a single left axillary lymph node removed  (b) status post chemoradiation for right upper lobectomy, making adjuvant radiation for her breast cancer problematic  (9) fulvestrant was to start 07/13/2019, but patient opted against  (a) patient is intolerant of aromatase inhibitors and tamoxifen is contraindicated (#5)   PLAN:   Sakile is now 17 years out from definitive surgery for her stage I lung cancer and a little over 2 yrs out from her second left lumpectomy, with no evidence of active disease.  She has refused adjuvant antiestrogens and is being followed with observation alone.  She has tried aromatase inhibitors but could not tolerate them well.  She opted against Faslodex and tamoxifen was not given given her history of factor V Leiden mutation.   Mammogram Jan 2024 neg for malignancy.  From the breast cancer standpoint, there appears to be no concern for disease recurrence. With regards to her non small cell lung cancer, she will continue to FU with Dr Roselind Messier, she had IMRT in April to right paravertebral lung nodule, had IMRT to left lingular nodule in Oct 2023. She is unfortunately not a good candidate for systemic chemotherapy.  If she continues to have these recurrent non-small cell lung cancers, hopefully we can test for CPS score/PD-L1 and see if she can receive immunotherapy.  Even this could be challenging given her baseline lung status however this may be better tolerated than chemotherapy overall. I have recommended that she come back to see me in about 6 months rather than  a year given the new changes in the non-small cell lung cancer. Repeat mammogram ordered for January 2025.  She will follow-up with [place] in 6 months otherwise.  She was encouraged to contact us sooner with any new questions or concerns Total time spent: 30 minutes  *Total Encounter Time as defined by the Centers for Medicare and Medicaid Services includes, in addition to the face-to-face time of a patient visit (documented in the note above) non-face-to-face time: obtaining and reviewing outside history, ordering and reviewing medications, tests or procedures, care coordination (communications with other health care professionals or caregivers) and documentation in the medical record.

## 2022-12-17 ENCOUNTER — Telehealth: Payer: Self-pay | Admitting: Hematology and Oncology

## 2022-12-17 NOTE — Telephone Encounter (Signed)
Spoke with patient confirming upcoming appointment  

## 2022-12-19 ENCOUNTER — Other Ambulatory Visit: Payer: Self-pay | Admitting: *Deleted

## 2022-12-30 DIAGNOSIS — J449 Chronic obstructive pulmonary disease, unspecified: Secondary | ICD-10-CM | POA: Diagnosis not present

## 2022-12-30 DIAGNOSIS — I700 Atherosclerosis of aorta: Secondary | ICD-10-CM | POA: Diagnosis not present

## 2022-12-30 DIAGNOSIS — I779 Disorder of arteries and arterioles, unspecified: Secondary | ICD-10-CM | POA: Diagnosis not present

## 2022-12-30 DIAGNOSIS — Z0000 Encounter for general adult medical examination without abnormal findings: Secondary | ICD-10-CM | POA: Diagnosis not present

## 2022-12-30 DIAGNOSIS — E46 Unspecified protein-calorie malnutrition: Secondary | ICD-10-CM | POA: Diagnosis not present

## 2022-12-30 DIAGNOSIS — D6851 Activated protein C resistance: Secondary | ICD-10-CM | POA: Diagnosis not present

## 2022-12-30 DIAGNOSIS — C3490 Malignant neoplasm of unspecified part of unspecified bronchus or lung: Secondary | ICD-10-CM | POA: Diagnosis not present

## 2023-01-13 DIAGNOSIS — I779 Disorder of arteries and arterioles, unspecified: Secondary | ICD-10-CM | POA: Diagnosis not present

## 2023-01-13 DIAGNOSIS — I700 Atherosclerosis of aorta: Secondary | ICD-10-CM | POA: Diagnosis not present

## 2023-01-13 DIAGNOSIS — D6851 Activated protein C resistance: Secondary | ICD-10-CM | POA: Diagnosis not present

## 2023-03-05 ENCOUNTER — Ambulatory Visit: Payer: Medicare HMO | Admitting: Emergency Medicine

## 2023-03-05 ENCOUNTER — Encounter: Payer: Self-pay | Admitting: Emergency Medicine

## 2023-03-05 VITALS — BP 126/72 | HR 84 | Temp 98.0°F | Ht 64.0 in | Wt 103.0 lb

## 2023-03-05 DIAGNOSIS — R918 Other nonspecific abnormal finding of lung field: Secondary | ICD-10-CM

## 2023-03-05 DIAGNOSIS — J438 Other emphysema: Secondary | ICD-10-CM

## 2023-03-05 NOTE — Patient Instructions (Signed)
We will plan to repeat a surveillance CT scan of the chest in December 2024 Follow with Dr. Al Pimple as planned in January 2025 Continue your Dulera 2 puffs twice a day.  Rinse and gargle after using. Follow with Dr. Delton Coombes in January 2025

## 2023-03-05 NOTE — Assessment & Plan Note (Signed)
She had empiric radiation therapy to right lower lobe nodule.  Will plan to repeat her CT chest in December.  She is likely not a candidate for any further radiation therapy.  If she has any progressive changes we would have to consider the pros and cons of bronchoscopy for tissue diagnosis, molecular studies

## 2023-03-05 NOTE — Assessment & Plan Note (Signed)
Continue her Dulera as ordered.

## 2023-03-05 NOTE — Progress Notes (Signed)
Subjective:    Patient ID: Jessica Gilbert, female    DOB: 07-13-1945, 77 y.o.   MRN: [id number]  HPI  ROV 09/26/2022 --follow-up visit for Jessica Gilbert.  She is 74, has a history of severe obstructive lung disease, non-small cell lung cancers: Right upper lobe lobectomy 2005, lingular non-small cell lung cancer (02/2022) treated with SBRT with apparently good results.  She follows with Dr. Roselind Messier.  Her surveillance imaging has shown an enlarging hypermetabolic nodule in the right lower lobe.  There is also a new 12 mm irregular nodular lesion in the left lower lobe that was not hypermetabolic on PET scan done 09/01/2022 (indeterminate).  Currently on Dulera, uses Flonase, Mucinex. She is concerned about doing more XRT, how it would affect her functional capacity. She has tried and failed trelegy, breztri, and others without any benefit. She has albuterol, uses few times a week.   ROV 03/05/2023 --77 year old woman with severe obstructive lung disease, breast CA, and history of non-small cell lung cancer in the right upper lobe (2005), lingula (02/2022), and now presumed in the RLL.  I last saw her in 09/2022 at which time she had an enlarging hypermetabolic right lower lobe nodule and a less definitive left lower lobe nodule.  Empiric SBRT was performed with Dr. Roselind Messier to the nodule on the right. She follows w Dr Al Pimple Has been managed on [facility], does not tolerate a LAMA. She feels depressed, fatigued. Has some intermittent neck and back pain. Her breathing has been stable. She has some rare cough prod of cream mucous.     Review of Systems As per HPI  Past Medical History:  Diagnosis Date   Acute respiratory failure (HCC)    Breast cancer (HCC)    left breast   Carotid stenosis, right    Celiac disease    Dyspnea    Emphysema lung (HCC)    Factor 5 Leiden mutation, heterozygous (HCC)    States no symptoms   History of hiatal hernia    History of radiation therapy    Left  Lung-03/24/22-04/04/22- Dr. Antony Blackbird   Human metapneumovirus pneumonia 08/2013   Hyperlipidemia    Hypertension    Lung cancer (HCC) 2005   Rt upper lobe   Stroke (HCC)    TIA w/o defecits     Family History  Problem Relation Age of Onset   Heart disease Father    High blood pressure Father    Congestive Heart Failure Father    Leukemia Mother    Colon polyps Mother    High blood pressure Brother    Lung cancer Brother    Heart disease Brother    Diabetes Brother    Colon polyps Sister    Breast cancer Sister    High blood pressure Sister    Emphysema Sister    Pancreatic cancer Paternal Uncle      Social History   Socioeconomic History   Marital status: Divorced    Spouse name: Not on file   Number of children: Not on file   Years of education: Not on file   Highest education level: Not on file  Occupational History   Not on file  Tobacco Use   Smoking status: Former    Current packs/day: 0.00    Average packs/day: 1 pack/day for 35.0 years (35.0 ttl pk-yrs)    Types: Cigarettes    Start date: 01/28/1969    Quit date: 01/29/2004    Years since quitting: 19.1  Smokeless tobacco: Never  Vaping Use   Vaping status: Never Used  Substance and Sexual Activity   Alcohol use: Not Currently   Drug use: No   Sexual activity: Not on file  Other Topics Concern   Not on file  Social History Narrative   Not on file   Social Determinants of Health   Financial Resource Strain: Not on file  Food Insecurity: Not on file  Transportation Needs: Not on file  Physical Activity: Not on file  Stress: Not on file  Social Connections: Not on file  Intimate Partner Violence: Not on file    Has been an accountant Has lived all over [place], also [place] and [place] 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  No inhaled exposures to her knowledge    Allergies  Allergen Reactions   Aspirin Other (See Comments)    "burning stomach"   Codeine Nausea Only   Vitamin D Analogs Other (See  Comments)    Optical migraines      Outpatient Medications Prior to Visit  Medication Sig Dispense Refill   diphenhydrAMINE (BENADRYL) 25 MG tablet Take 25 mg by mouth at bedtime.     fluticasone (FLONASE SENSIMIST) 27.5 MCG/SPRAY nasal spray Place 1 spray into the nose daily.     guaiFENesin (MUCINEX) 600 MG 12 hr tablet Take 600 mg by mouth 2 (two) times daily as needed for to loosen phlegm.     ibuprofen (ADVIL,MOTRIN) 200 MG tablet Take 200 mg by mouth every 6 (six) hours as needed for moderate pain.     mometasone-formoterol (DULERA) 200-5 MCG/ACT AERO Inhale 2 puffs into the lungs 2 (two) times daily. 1 each 5   Multiple Vitamin (MULTIVITAMIN WITH MINERALS) TABS tablet Take 1 tablet by mouth daily.     telmisartan (MICARDIS) 40 MG tablet Take 40 mg by mouth daily.     tetrahydrozoline-zinc (VISINE-AC) 0.05-0.25 % ophthalmic solution Place 1 drop into both eyes 3 (three) times daily as needed (dry eyes).     No facility-administered medications prior to visit.        Objective:   Physical Exam  Vitals:   03/05/23 0908  BP: 126/72  Pulse: 84  Temp: 98 F (36.7 C)  TempSrc: Oral  SpO2: 96%  Weight: 103 lb (46.7 kg)  Height: 5\' 4"  (1.626 m)   Gen: Pleasant, thin, in no distress,  normal affect  ENT: No lesions,  mouth clear,  oropharynx clear, no postnasal drip  Neck: No JVD, no stridor  Lungs: No use of accessory muscles, no crackles or wheezing on normal respiration, no wheeze on forced expiration  Cardiovascular: RRR, heart sounds normal, no murmur or gallops, no peripheral edema  Musculoskeletal: No deformities, no cyanosis or clubbing  Neuro: alert, awake, non focal  Skin: Warm, no lesions or rash     Assessment & Plan:  Pulmonary nodules She had empiric radiation therapy to right lower lobe nodule.  Will plan to repeat her CT chest in December.  She is likely not a candidate for any further radiation therapy.  If she has any progressive changes we would  have to consider the pros and cons of bronchoscopy for tissue diagnosis, molecular studies  COPD with emphysema (HCC) Continue her [facility] as ordered.    Levy Pupa, MD, PhD 03/05/2023, 9:27 AM [facility] [phone] or if no answer before 7:00PM call [phone] For any issues after 7:00PM please call eLink [phone]

## 2023-04-06 DIAGNOSIS — Z23 Encounter for immunization: Secondary | ICD-10-CM | POA: Diagnosis not present

## 2023-04-20 DIAGNOSIS — Z23 Encounter for immunization: Secondary | ICD-10-CM | POA: Diagnosis not present

## 2023-05-26 ENCOUNTER — Ambulatory Visit
Admission: RE | Admit: 2023-05-26 | Discharge: 2023-05-26 | Disposition: A | Discharge status: Home / Self Care | Payer: Medicare HMO | Source: Ambulatory Visit | Attending: Emergency Medicine | Admitting: Emergency Medicine

## 2023-05-26 DIAGNOSIS — R911 Solitary pulmonary nodule: Secondary | ICD-10-CM | POA: Diagnosis not present

## 2023-05-26 DIAGNOSIS — I3139 Other pericardial effusion (noninflammatory): Secondary | ICD-10-CM | POA: Diagnosis not present

## 2023-05-26 DIAGNOSIS — R918 Other nonspecific abnormal finding of lung field: Secondary | ICD-10-CM

## 2023-05-26 DIAGNOSIS — R0602 Shortness of breath: Secondary | ICD-10-CM | POA: Diagnosis not present

## 2023-05-26 DIAGNOSIS — I700 Atherosclerosis of aorta: Secondary | ICD-10-CM | POA: Diagnosis not present

## 2023-06-19 ENCOUNTER — Ambulatory Visit: Payer: Medicare HMO | Admitting: Hematology and Oncology

## 2023-06-21 ENCOUNTER — Encounter: Payer: Self-pay | Admitting: Oncology

## 2023-07-15 ENCOUNTER — Encounter: Payer: Self-pay | Admitting: Oncology

## 2023-07-21 ENCOUNTER — Ambulatory Visit: Payer: Medicare HMO | Admitting: Adult Health

## 2023-07-22 ENCOUNTER — Ambulatory Visit: Payer: Medicare Other | Admitting: Adult Health

## 2023-07-22 ENCOUNTER — Encounter: Payer: Self-pay | Admitting: Adult Health

## 2023-07-22 VITALS — BP 132/62 | HR 93 | Temp 98.1°F | Ht 63.0 in | Wt 105.8 lb

## 2023-07-22 DIAGNOSIS — C3492 Malignant neoplasm of unspecified part of left bronchus or lung: Secondary | ICD-10-CM | POA: Diagnosis not present

## 2023-07-22 MED ORDER — IPRATROPIUM-ALBUTEROL 0.5-2.5 (3) MG/3ML IN SOLN: 3.0000 mL | Freq: Four times a day (QID) | RESPIRATORY_TRACT | 5 refills | Status: AC | PRN

## 2023-07-22 MED ORDER — IPRATROPIUM BROMIDE 0.03 % NA SOLN: 2.0000 | Freq: Two times a day (BID) | NASAL | 12 refills | Status: AC | PRN

## 2023-07-22 MED ORDER — ALBUTEROL SULFATE (2.5 MG/3ML) 0.083% IN NEBU: 2.5000 mg | INHALATION_SOLUTION | Freq: Four times a day (QID) | RESPIRATORY_TRACT | 5 refills | Status: DC | PRN

## 2023-07-22 NOTE — Patient Instructions (Addendum)
 Saline nasal spray Twice daily   Saline nasal gel At bedtime   Flonase nasal daily  Claritin 10mg  At bedtime As needed  drainage  Ipratropium  nasal spray As needed  drippy nose  Continue on Dulera  2 puffs Twice daily, rinse after use.  Albuterol  inhaler or  Duoneb neb As needed   Activity as tolerated High protein diet /ensure daily  CT chest in 2 months  Follow up in with Dr. Shelah  in 3 months and As needed

## 2023-07-22 NOTE — Progress Notes (Signed)
 @Patient  ID: Jessica Gilbert, female    DOB: [birth year], 78 y.o.   MRN: [id number]  Chief Complaint  Patient presents with   Follow-up   Discussed the use of AI scribe software for clinical note transcription with the patient, who gave verbal consent to proceed.  History of Present Illness    Referring provider: Dayna Motto, DO  HPI: 78 year old female former smoker followed for severe COPD and recurrent non-small lung cancer-right upper lobectomy 2005, lingular non-small cell lung cancer September 2023 status post SBRT, enlarging hypermetabolic right lower lobe status post SBRT April 2024 History of recurrent breast cancer status postlumpectomy  TEST/EVENTS :  CT chest May 26, 2023 decreased 6 mm right middle lobe nodule, atypical pulmonary cyst left apical with increased medial wall thickening, stable left subpleural nodularity, minimally decreased small pericardial effusion, emphysema   07/22/2023 Follow up : COPD, Lung cancer  Jessica Gilbert is a 78 year old female with severe COPD and recurrent non-small cell lung cancer who presents for a six-month follow-up.  She has a history of recurrent non-small cell lung cancer, having undergone a right upper lobectomy in 2005. She has experienced multiple recurrences, including a lingular non-small cell lung cancer diagnosed in September 2023, treated with SBRT. In April 2024, she received SBRT for an enlarging hypermetabolic right lower lobe nodule. A surveillance CT scan on May 26, 2023, showed a decreased right middle lobe nodule, atypical pulmonary cysts with increased medial wall thickening, stable left subpleural nodularity, and minimally decreased small pericardial effusion, along with emphysema.  She says overall she is doing okay.  She denies any hemoptysis or chest pain.  We discussed a follow-up CT chest in 2 months.  She has severe COPD, leading to significant shortness of breath with minimal activity. She uses Dulera ,  albuterol , and Flonase for her respiratory symptoms. She has tried other inhalers like Breztri  and Trelegy, but they were ineffective. She uses a nebulizer as needed. She experiences significant shortness of breath during daily activities, including driving and grocery shopping, and uses the grocery store as a form of exercise, walking and resting as needed. She reports a runny nose with activity, which she finds bothersome.  Her past medical history includes multiple episodes of pneumonia following a pneumonia vaccine in 2013, leading to her reluctance to receive further pneumonia vaccinations. She has received the flu vaccine and COVID booster but has not yet received the RSV vaccine.  She lives independently at home, with her sister occasionally staying with her. She manages her daily activities, including driving and grocery shopping.        Allergies  Allergen Reactions   Aspirin Other (See Comments)    burning stomach   Codeine Nausea Only   Vitamin D Analogs Other (See Comments)    Optical migraines     Immunization History  Administered Date(s) Administered   Fluad Quad(high Dose 65+) 03/15/2016, 04/14/2023   Influenza Split 03/16/2013, 04/11/2013, 03/15/2014   Influenza, High Dose Seasonal PF 04/22/2015, 03/24/2017, 04/02/2018, 04/05/2019, 04/03/2020, 03/23/2021   Influenza-Unspecified 03/15/2014   Moderna Sars-Covid-2 Vaccination 08/16/2019, 09/14/2019, 08/31/2020   Pfizer(Comirnaty)Fall Seasonal Vaccine 12 years and older 04/20/2023   Pneumococcal Polysaccharide-23 02/19/2012    Past Medical History:  Diagnosis Date   Acute respiratory failure (HCC)    Breast cancer (HCC)    left breast   Carotid stenosis, right    Celiac disease    Dyspnea    Emphysema lung (HCC)    Factor 5 Leiden mutation, heterozygous (  HCC)    States no symptoms   History of hiatal hernia    History of radiation therapy    Left Lung-03/24/22-04/04/22- Dr. Lynwood Nasuti   Human  metapneumovirus pneumonia 08/2013   Hyperlipidemia    Hypertension    Lung cancer (HCC) 2005   Rt upper lobe   Stroke (HCC)    TIA w/o defecits    Tobacco History: Social History   Tobacco Use  Smoking Status Former   Current packs/day: 0.00   Average packs/day: 1 pack/day for 35.0 years (35.0 ttl pk-yrs)   Types: Cigarettes   Start date: 01/28/1969   Quit date: 01/29/2004   Years since quitting: 19.4  Smokeless Tobacco Never   Counseling given: Not Answered   Outpatient Medications Prior to Visit  Medication Sig Dispense Refill   diphenhydrAMINE (BENADRYL) 25 MG tablet Take 25 mg by mouth at bedtime.     fluticasone (FLONASE SENSIMIST) 27.5 MCG/SPRAY nasal spray Place 1 spray into the nose daily.     guaiFENesin  (MUCINEX ) 600 MG 12 hr tablet Take 600 mg by mouth 2 (two) times daily as needed for to loosen phlegm.     ibuprofen (ADVIL,MOTRIN) 200 MG tablet Take 200 mg by mouth every 6 (six) hours as needed for moderate pain.     mometasone -formoterol  (DULERA ) 200-5 MCG/ACT AERO Inhale 2 puffs into the lungs 2 (two) times daily. 1 each 5   Multiple Vitamin (MULTIVITAMIN WITH MINERALS) TABS tablet Take 1 tablet by mouth daily.     Nutritional Supplements (HIGH-PROTEIN NUTRITIONAL SHAKE PO) Take by mouth. 1 time a day     telmisartan (MICARDIS) 40 MG tablet Take 40 mg by mouth daily.     tetrahydrozoline-zinc (VISINE-AC) 0.05-0.25 % ophthalmic solution Place 1 drop into both eyes 3 (three) times daily as needed (dry eyes).     No facility-administered medications prior to visit.     Review of Systems:   Constitutional:   No  weight loss, night sweats,  Fevers, chills, +fatigue, or  lassitude.  HEENT:   No headaches,  Difficulty swallowing,  Tooth/dental problems, or  Sore throat,                No sneezing, itching, ear ache, nasal congestion, post nasal drip,   CV:  No chest pain,  Orthopnea, PND, swelling in lower extremities, anasarca, dizziness, palpitations, syncope.    GI  No heartburn, indigestion, abdominal pain, nausea, vomiting, diarrhea, change in bowel habits, loss of appetite, bloody stools.   Resp:   No chest wall deformity  Skin: no rash or lesions.  GU: no dysuria, change in color of urine, no urgency or frequency.  No flank pain, no hematuria   MS:  No joint pain or swelling.  No decreased range of motion.  No back pain.    Physical Exam  BP 132/62 (BP Location: Left Arm, Cuff Size: Normal)   Pulse 93   Temp 98.1 F (36.7 C) (Oral)   Ht 5' 3 (1.6 m)   Wt 105 lb 12.8 oz (48 kg)   SpO2 100%   BMI 18.74 kg/m   GEN: A/Ox3; pleasant , NAD, thin and frail   HEENT:  Terry/AT,  EACs-clear, TMs-wnl, NOSE-clear, THROAT-clear, no lesions, no postnasal drip or exudate noted.   NECK:  Supple w/ fair ROM; no JVD; normal carotid impulses w/o bruits; no thyromegaly or nodules palpated; no lymphadenopathy.    RESP  Clear  P & A; w/o, wheezes/ rales/ or rhonchi. no accessory  muscle use, no dullness to percussion  CARD:  RRR, no m/r/g, no peripheral edema, pulses intact, no cyanosis or clubbing.  GI:   Soft & nt; nml bowel sounds; no organomegaly or masses detected.   Musco: Warm bil, no deformities or joint swelling noted.   Neuro: alert, no focal deficits noted.    Skin: Warm, no lesions or rashes    Lab Results:  CBC     BNP No results found for: BNP  ProBNP   Imaging: No results found.  Administration History     None          Latest Ref Rng & Units 08/16/2021   10:49 AM 02/16/2014    9:52 AM  PFT Results  FVC-Pre L 1.91  2.16   FVC-Predicted Pre % 70  72   FVC-Post L 1.98  2.27   FVC-Predicted Post % 72  76   Pre FEV1/FVC % % 32  35   Post FEV1/FCV % % 34  36   FEV1-Pre L 0.61  0.76   FEV1-Predicted Pre % 29  33   FEV1-Post L 0.67  0.82   DLCO uncorrected ml/min/mmHg 8.40  12.06   DLCO UNC% % 45  52   DLCO corrected ml/min/mmHg 8.40    DLCO COR %Predicted % 45    DLVA Predicted % 54  57   TLC L 5.77   6.03   TLC % Predicted % 117  123   RV % Predicted % 161  159     No results found for: NITRICOXIDE      Assessment & Plan:   No problem-specific Assessment & Plan notes found for this encounter.  Assessment and Plan    Chronic Obstructive Pulmonary Disease (COPD) Severe COPD presents with significant dyspnea on minimal activity and decreased lung function due to emphysema.  She has perceived benefit with Dulera .  while previous trials of Breztri  and Trelegy were ineffective.  Continue Dulera  with 2 puffs in the morning and evening, and albuterol  as needed. Prescribe DuoNeb for nebulizer use every 6 hours as needed.  Basically oxygen levels have remained normal on room air.  Does not require oxygen at this time.  Protein calorie malnutrition.  Encourage a high-protein diet and consider adding Ensure daily.  Non-Small Cell Lung Cancer (NSCLC) Recurrent NSCLC history includes a right upper lobectomy in 2005, lingular NSCLC treated with SBRT in September 2023, and an enlarging hypermetabolic right lower lobe nodule treated with SBRT in April 2024. Surveillance CT in December 2024 showed a decreased size of the right middle lobe nodule, well-managed left subpleural nodularity, and atypical pulmonary cysts. Ongoing surveillance with CT scans is crucial to monitor changes. Order a CT chest in two months and follow up with Dr. Shelah in three months.  Chronic Rhinitis Significant nasal drainage occurs with activity. Currently using Flonase daily.  Atrovent  nasal spray and saline treatments can manage symptoms. Prescribe Atrovent  nasal spray with 2 puffs twice a day as needed. Recommend saline spray 2-3 times a day and saline gel for nasal passages. Consider Claritin 10 mg as needed for allergy symptoms.   General Health Maintenance Received flu vaccine and COVID booster. RSV vaccine was discussed.. The last pneumonia vaccine was in 2013, but multiple episodes of pneumonia post-vaccination  have caused hesitancy. Discuss the potential benefits and risks of the pneumonia vaccine.      I spent  42  minutes dedicated to the care of this patient on the date of this encounter  to include pre-visit review of records, face-to-face time with the patient discussing conditions above, post visit ordering of testing, clinical documentation with the electronic health record, making appropriate referrals as documented, and communicating necessary findings to members of the patients care team.    Madelin Stank, NP 07/22/2023

## 2023-07-24 DIAGNOSIS — Z1231 Encounter for screening mammogram for malignant neoplasm of breast: Secondary | ICD-10-CM | POA: Diagnosis not present

## 2023-07-28 ENCOUNTER — Encounter: Payer: Self-pay | Admitting: Hematology and Oncology

## 2023-07-31 DIAGNOSIS — I10 Essential (primary) hypertension: Secondary | ICD-10-CM | POA: Diagnosis not present

## 2023-07-31 DIAGNOSIS — D6851 Activated protein C resistance: Secondary | ICD-10-CM | POA: Diagnosis not present

## 2023-07-31 DIAGNOSIS — L989 Disorder of the skin and subcutaneous tissue, unspecified: Secondary | ICD-10-CM | POA: Diagnosis not present

## 2023-07-31 DIAGNOSIS — J439 Emphysema, unspecified: Secondary | ICD-10-CM | POA: Diagnosis not present

## 2023-07-31 DIAGNOSIS — C3490 Malignant neoplasm of unspecified part of unspecified bronchus or lung: Secondary | ICD-10-CM | POA: Diagnosis not present

## 2023-07-31 DIAGNOSIS — J449 Chronic obstructive pulmonary disease, unspecified: Secondary | ICD-10-CM | POA: Diagnosis not present

## 2023-08-12 DIAGNOSIS — L859 Epidermal thickening, unspecified: Secondary | ICD-10-CM | POA: Diagnosis not present

## 2023-08-12 DIAGNOSIS — L989 Disorder of the skin and subcutaneous tissue, unspecified: Secondary | ICD-10-CM | POA: Diagnosis not present

## 2023-08-12 DIAGNOSIS — L570 Actinic keratosis: Secondary | ICD-10-CM | POA: Diagnosis not present

## 2023-09-09 IMAGING — CT CT CHEST W/ CM
2 of 4 series · 14 of 36 positions shown, 17 images · IV contrast (omnipaque)
Comparison: Chest CT 03/14/2014.

CLINICAL DATA: 75-year-old female with history of breast cancer
with history of weight loss. Follow-up study.

EXAM:
CT CHEST WITH CONTRAST
TECHNIQUE: Multidetector CT imaging of the chest was performed during
intravenous contrast administration.
CONTRAST:  60mL OMNIPAQUE IOHEXOL 350 MG/ML SOLN

[Series 2: axial st · axial · 0.70mm/px · z∈[-230,+78]mm · 11 of 182 slices shown, 14 images]
[im 14/182  mediastinal]
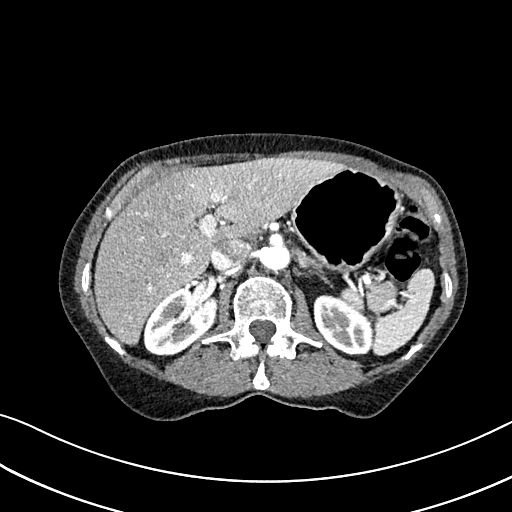
[im 14/182  lung]
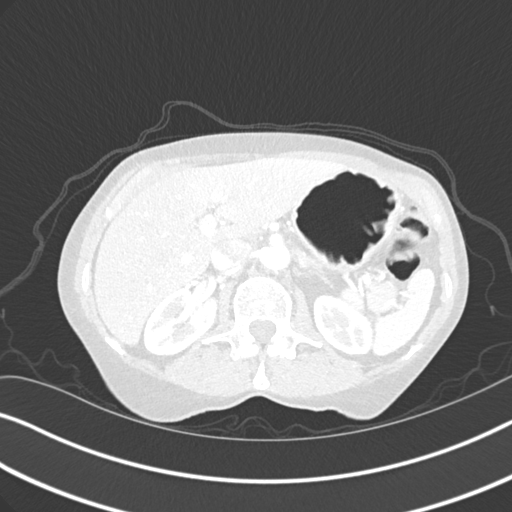
[im 28/182  lung]
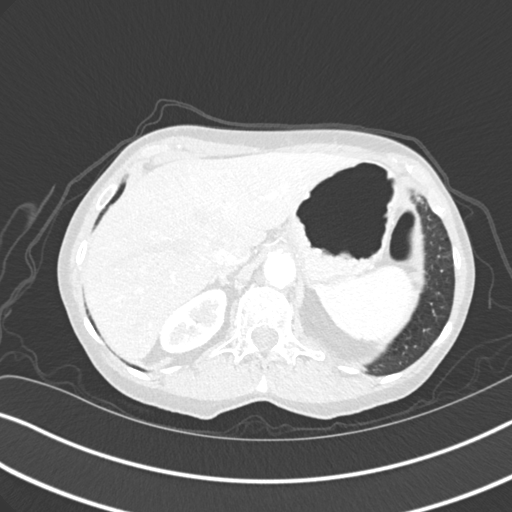
[im 42/182  lung]
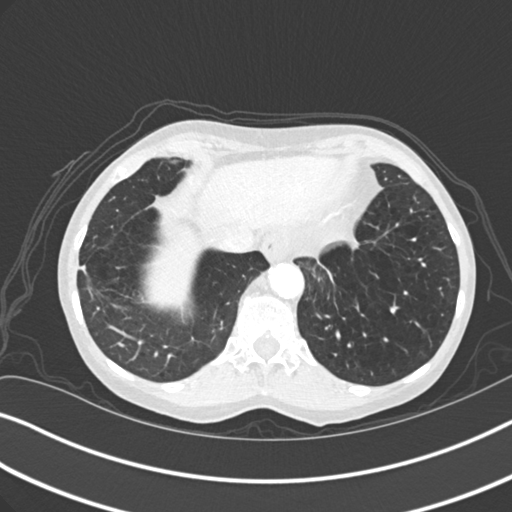
[im 56/182  lung]
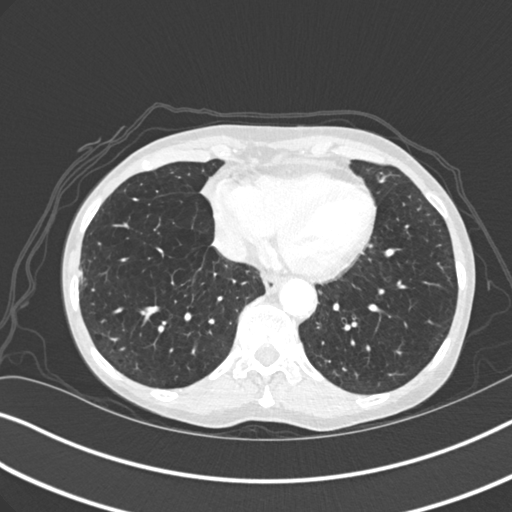
[im 70/182  mediastinal]
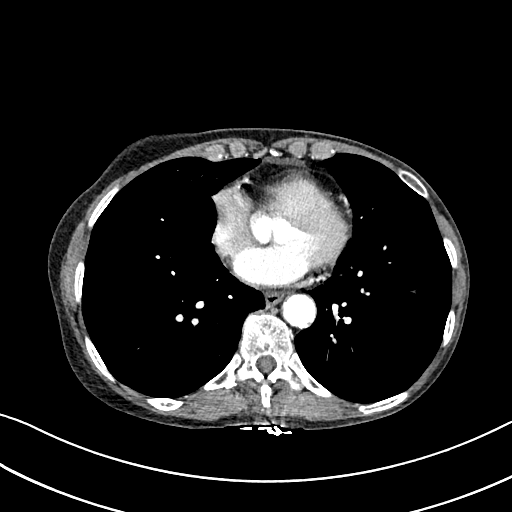
[im 70/182  lung]
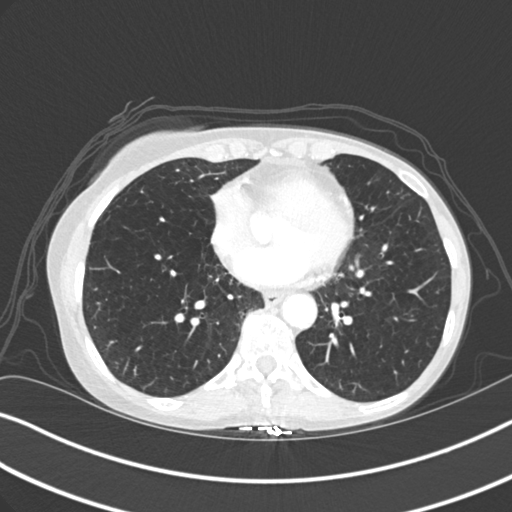
[im 98/182  lung]
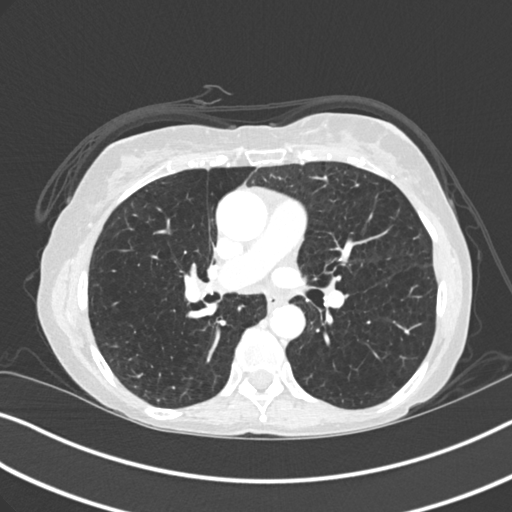
[im 112/182  lung]
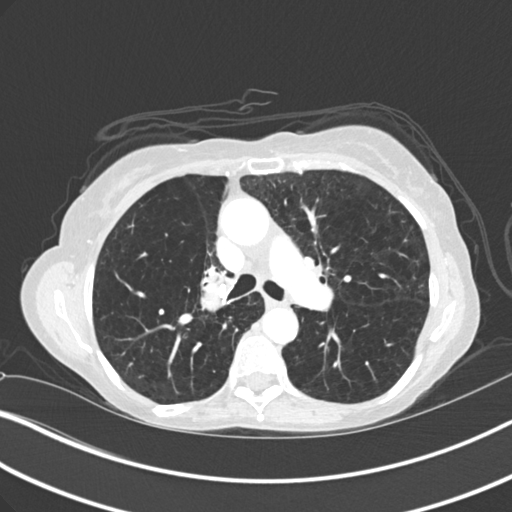
[im 126/182  lung]
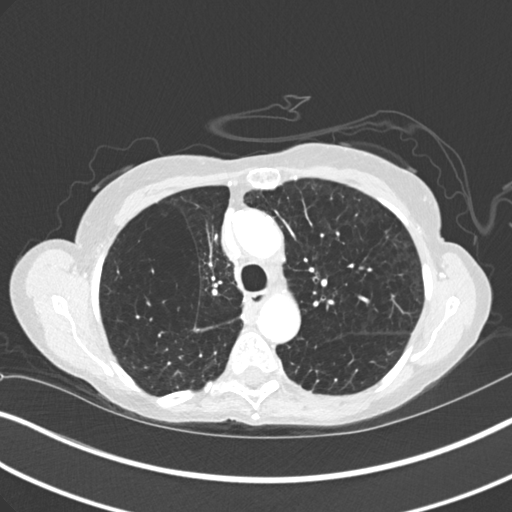
[im 140/182  mediastinal]
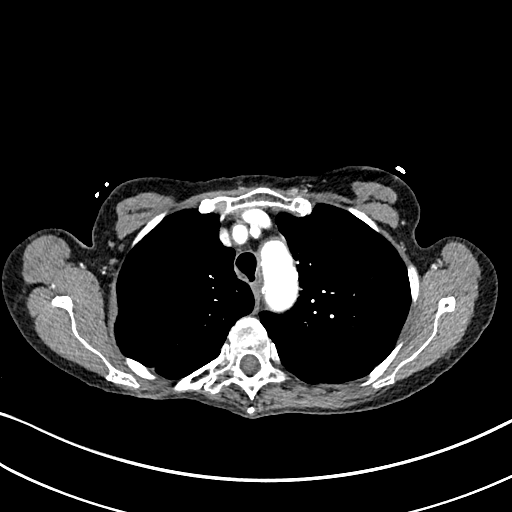
[im 140/182  lung]
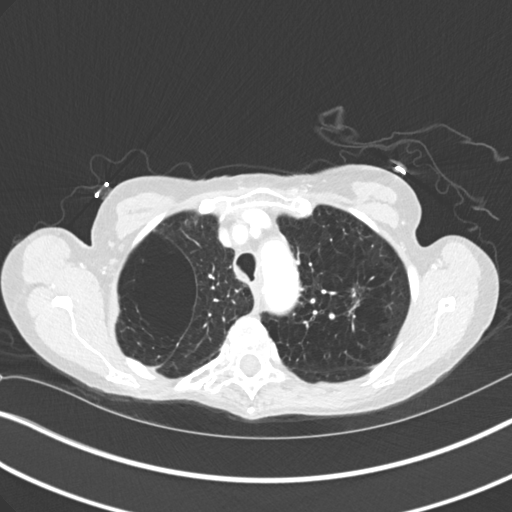
[im 154/182  lung]
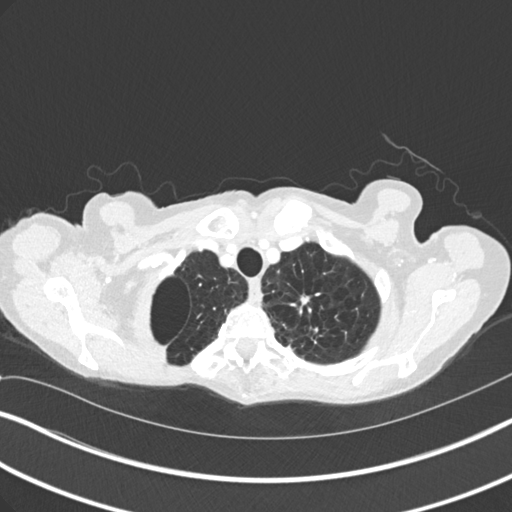
[im 168/182  lung]
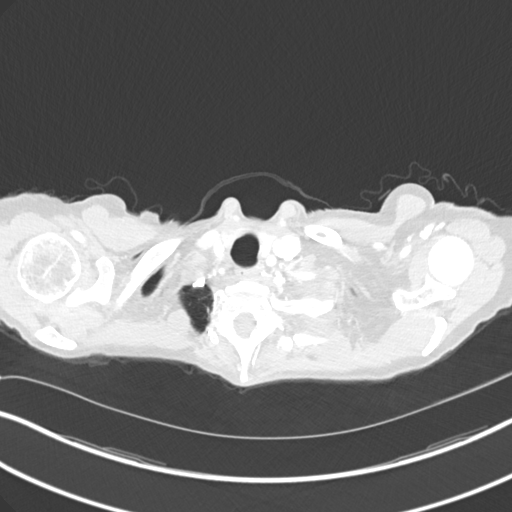

[Series 5: coronal · coronal · 0.78mm/px · 3 of 144 slices shown]
[im 29/144  lung]
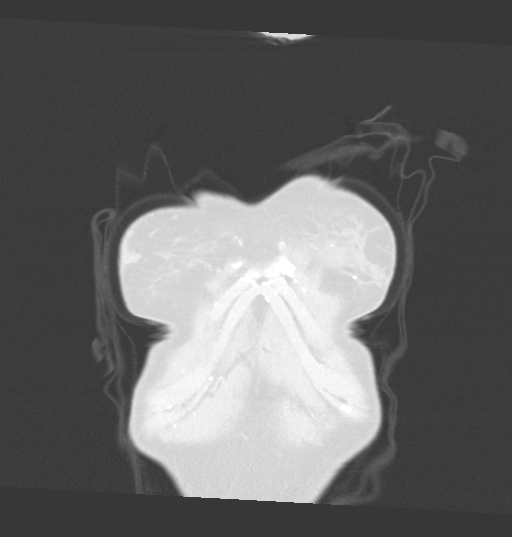
[im 58/144  lung]
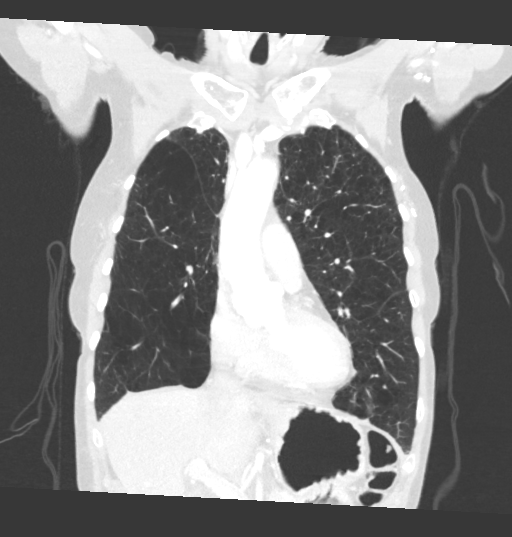
[im 86/144  lung]
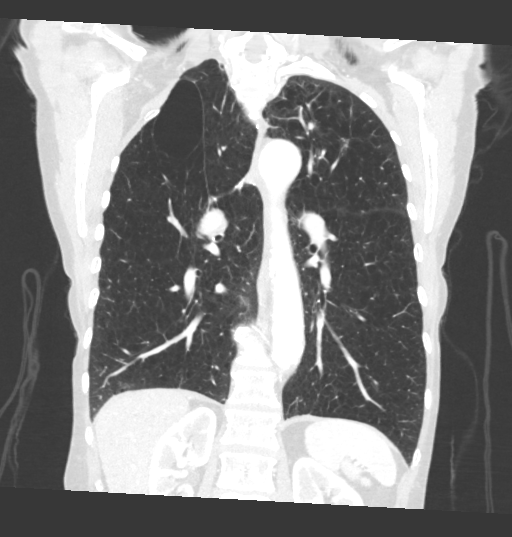

[14 of 36 positions shown; findings below may reference images not displayed]

FINDINGS: Cardiovascular: Heart size is normal. Small amount of anterior
pericardial fluid and/or thickening, unlikely to be of any
hemodynamic significance at this time. No associated pericardial
calcification. There is aortic atherosclerosis, as well as
atherosclerosis of the great vessels of the mediastinum and the
coronary arteries, including calcified atherosclerotic plaque in the
left main, left anterior descending, left circumflex and right
coronary arteries.

Mediastinum/Nodes: No pathologically enlarged mediastinal, internal
mammary or hilar lymph nodes. Esophagus is unremarkable in
appearance. No axillary or subpectoral lymphadenopathy.

Lungs/Pleura: Status post right upper lobectomy. Compensatory
hyperexpansion of the right middle and lower lobes. In the left
upper lobe there are numerous nodular areas of architectural
distortion, particularly near the apex. The largest of these has
some internal cavitation (axial image 21 of series 7) measuring up
to 1.5 x 1.2 x 1.7 cm, and is in contact with the pleural surface
superiorly. Several nearby satellite nodules are noted in the left
upper lobe, largest of which measures only 7 x 6 mm (axial image 36
of series 7). No acute consolidative airspace disease. No pleural
effusions. Diffuse bronchial wall thickening with severe
centrilobular and paraseptal emphysema.

Upper Abdomen: Aortic atherosclerosis.

Musculoskeletal: There are no aggressive appearing lytic or blastic
lesions noted in the visualized portions of the skeleton.
IMPRESSION: 1. Multiple new nodular areas of architectural distortion in the
left upper lobe near the apex, largest of which has some internal
cavitation. These are nonspecific, and favored to be of infectious
or inflammatory etiology, however, underlying neoplasm is difficult
to entirely exclude. Further clinical evaluation is recommended.
Consideration for short-term trial of antimicrobial therapy followed
by repeat noncontrast chest CT in 3-4 weeks is suggested for initial
evaluation. PET-CT is not encouraged, as the likelihood of a
false-positive examination in the setting of infection is very high.
2. Diffuse bronchial wall thickening with severe centrilobular and
paraseptal emphysema; imaging findings suggestive of underlying
COPD.
3. Aortic atherosclerosis, in addition to left main and 3 vessel
coronary artery disease. Assessment for potential risk factor
modification, dietary therapy or pharmacologic therapy may be
warranted, if clinically indicated.

These results will be called to the ordering clinician or
representative by the Radiologist Assistant, and communication
documented in the PACS or [REDACTED].

Aortic Atherosclerosis (KIM88-VM7.7) and Emphysema (KIM88-AWK.X).

## 2023-09-17 ENCOUNTER — Ambulatory Visit
Admission: RE | Admit: 2023-09-17 | Discharge: 2023-09-17 | Disposition: A | Discharge status: Home / Self Care | Payer: Medicare Other | Source: Ambulatory Visit | Attending: Adult Health | Admitting: Adult Health

## 2023-09-17 DIAGNOSIS — Z902 Acquired absence of lung [part of]: Secondary | ICD-10-CM | POA: Diagnosis not present

## 2023-09-17 DIAGNOSIS — C3492 Malignant neoplasm of unspecified part of left bronchus or lung: Secondary | ICD-10-CM

## 2023-09-17 DIAGNOSIS — R911 Solitary pulmonary nodule: Secondary | ICD-10-CM | POA: Diagnosis not present

## 2023-10-14 NOTE — Progress Notes (Signed)
 Called and spoke with patient, advised of results/recommendations per Rubye Oaks NP.  She verbalized understanding.  Nothing further needed.

## 2023-10-22 ENCOUNTER — Encounter: Payer: Self-pay | Admitting: Emergency Medicine

## 2023-10-22 ENCOUNTER — Ambulatory Visit: Payer: Medicare Other | Admitting: Emergency Medicine

## 2023-10-22 VITALS — BP 135/77 | HR 87 | Ht 63.0 in | Wt 104.6 lb

## 2023-10-22 DIAGNOSIS — J438 Other emphysema: Secondary | ICD-10-CM

## 2023-10-22 DIAGNOSIS — R918 Other nonspecific abnormal finding of lung field: Secondary | ICD-10-CM | POA: Diagnosis not present

## 2023-10-22 MED ORDER — DOXYCYCLINE HYCLATE 100 MG PO TABS
100.0000 mg | ORAL_TABLET | Freq: Two times a day (BID) | ORAL | 0 refills | Status: DC
Start: 2023-10-22 — End: 2024-04-08

## 2023-10-22 NOTE — Patient Instructions (Addendum)
 We reviewed your CT scan of the chest today. Please take doxycycline as directed until completely gone. Continue your Dulera  2 puffs twice a day.  Rinse and gargle after using. Keep your DuoNeb available to use up to every 6 hours if needed for shortness of breath, chest tightness, wheezing. We will plan to repeat your CT chest in late June. Follow Dr. Baldwin Levee after your CT chest so we can review those results together.

## 2023-10-22 NOTE — Progress Notes (Signed)
 Subjective:    Patient ID: Jessica Gilbert, female    DOB: 13-May-1946, 78 y.o.   MRN: [id number]  HPI   ROV 10/22/2023 --78 year old woman with a history of breast cancer, severe obstructive lung disease, right upper lobe and lingular non-small cell lung cancer.  She underwent a right upper lobe lobectomy 2005, SBRT to the lingula 2023.  Today she reports some increased SOB for several weeks, also some increased wheeze w activity. She has a cough with some tan / green phlegm Managed on Dulera , DuoNeb as needed, guaifenesin  as needed, Flonase, Atrovent  nasal spray, Pepcid, Benadryl nightly. She has tried breztri  and Trelegy before but worsened on these.   CT scan of the chest/3/25 reviewed by me, showed changes consistent with her right upper lobe lobectomy, severe emphysema, calcified left apical cyst, unchanged 5 mm left apical nodule, unchanged 5 mm left lower lobe nodule, new lateral left basilar subpleural nodularity 2.6 x 1.4 cm.   Review of Systems As per HPI  Past Medical History:  Diagnosis Date   Acute respiratory failure (HCC)    Breast cancer (HCC)    left breast   Carotid stenosis, right    Celiac disease    Dyspnea    Emphysema lung (HCC)    Factor 5 Leiden mutation, heterozygous (HCC)    States no symptoms   History of hiatal hernia    History of radiation therapy    Left Lung-03/24/22-04/04/22- Dr. Retta Caster   Human metapneumovirus pneumonia 08/2013   Hyperlipidemia    Hypertension    Lung cancer (HCC) 2005   Rt upper lobe   Stroke (HCC)    TIA w/o defecits     Family History  Problem Relation Age of Onset   Heart disease Father    High blood pressure Father    Congestive Heart Failure Father    Leukemia Mother    Colon polyps Mother    High blood pressure Brother    Lung cancer Brother    Heart disease Brother    Diabetes Brother    Colon polyps Sister    Breast cancer Sister    High blood pressure Sister    Emphysema Sister    Pancreatic  cancer Paternal Uncle      Social History   Socioeconomic History   Marital status: Divorced    Spouse name: Not on file   Number of children: Not on file   Years of education: Not on file   Highest education level: Not on file  Occupational History   Not on file  Tobacco Use   Smoking status: Former    Current packs/day: 0.00    Average packs/day: 1 pack/day for 35.0 years (35.0 ttl pk-yrs)    Types: Cigarettes    Start date: 01/28/1969    Quit date: 01/29/2004    Years since quitting: 19.7   Smokeless tobacco: Never  Vaping Use   Vaping status: Never Used  Substance and Sexual Activity   Alcohol use: Not Currently   Drug use: No   Sexual activity: Not on file  Other Topics Concern   Not on file  Social History Narrative   Not on file   Social Drivers of Health   Financial Resource Strain: Not on file  Food Insecurity: Not on file  Transportation Needs: Not on file  Physical Activity: Not on file  Stress: Not on file  Social Connections: Not on file  Intimate Partner Violence: Not on file  Has been an accountant Has lived all over [place], also [place] and [place] 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  No inhaled exposures to her knowledge    Allergies  Allergen Reactions   Aspirin Other (See Comments)    "burning stomach"   Codeine Nausea Only   Vitamin D Analogs Other (See Comments)    Optical migraines      Outpatient Medications Prior to Visit  Medication Sig Dispense Refill   albuterol  (VENTOLIN  HFA) 108 (90 Base) MCG/ACT inhaler Inhale 1 puff into the lungs every 4 (four) hours as needed for shortness of breath.     diphenhydrAMINE (BENADRYL ALLERGY) 25 MG tablet Take 25 mg by mouth at bedtime as needed for allergies.     diphenhydrAMINE (BENADRYL) 25 MG tablet Take 25 mg by mouth at bedtime.     famotidine (PEPCID AC) 10 MG tablet Take 10 mg by mouth daily.     fluticasone (FLONASE SENSIMIST) 27.5 MCG/SPRAY nasal spray Place 1 spray into the nose daily.      guaiFENesin  (MUCINEX ) 600 MG 12 hr tablet Take 600 mg by mouth 2 (two) times daily as needed for to loosen phlegm.     ibuprofen (ADVIL,MOTRIN) 200 MG tablet Take 200 mg by mouth every 6 (six) hours as needed for moderate pain.     ipratropium (ATROVENT ) 0.03 % nasal spray Place 2 sprays into both nostrils 2 (two) times daily as needed for rhinitis. 30 mL 12   ipratropium-albuterol  (DUONEB) 0.5-2.5 (3) MG/3ML SOLN Take 3 mLs by nebulization every 6 (six) hours as needed. 120 mL 5   mometasone -formoterol  (DULERA ) 200-5 MCG/ACT AERO Inhale 2 puffs into the lungs 2 (two) times daily. 1 each 5   Multiple Vitamin (MULTIVITAMIN WITH MINERALS) TABS tablet Take 1 tablet by mouth daily.     Nutritional Supplements (HIGH-PROTEIN NUTRITIONAL SHAKE PO) Take by mouth. 1 time a day     telmisartan (MICARDIS) 40 MG tablet Take 40 mg by mouth daily.     tetrahydrozoline-zinc (VISINE-AC) 0.05-0.25 % ophthalmic solution Place 1 drop into both eyes 3 (three) times daily as needed (dry eyes).     No facility-administered medications prior to visit.        Objective:   Physical Exam  Vitals:   10/22/23 1059  BP: 135/77  Pulse: 87  SpO2: 94%  Weight: 104 lb 9.6 oz (47.4 kg)  Height: 5\' 3"  (1.6 m)   Gen: Pleasant, thin, in no distress,  normal affect  ENT: No lesions,  mouth clear,  oropharynx clear, no postnasal drip  Neck: No JVD, no stridor  Lungs: No use of accessory muscles, no crackles or wheezing on normal respiration, no wheeze on forced expiration  Cardiovascular: RRR, heart sounds normal, no murmur or gallops, no peripheral edema  Musculoskeletal: No deformities, no cyanosis or clubbing  Neuro: alert, awake, non focal  Skin: Warm, no lesions or rash     Assessment & Plan:  COPD with emphysema (HCC) She has tried Breztri , Trelegy, worsened on days.  Best choice we have been able to come up with is Dulera .  DuoNeb as needed.  She is having more wheeze, more shortness of breath.   Question whether this is in the setting of acute infection.  She may be a candidate to start Ohtuvayre  at some point or to consider scheduled prednisone .  Pulmonary nodules There is a new rounded opacity at the left base compared with her scan 3 months ago.  Given her clinical symptoms,  question whether this may be pneumonia.  I will treat her with doxycycline and then repeat her CT in about 8 weeks.  If the lesion progresses or even if it does not resolve then we will need to think about possible biopsy and treatment options given her cancer history.    Racheal Buddle, MD, PhD 10/22/2023, 11:32 AM [facility] [phone] or if no answer before 7:00PM call [phone] For any issues after 7:00PM please call eLink [phone]

## 2023-10-22 NOTE — Assessment & Plan Note (Signed)
 She has tried Breztri , Trelegy, worsened on days.  Best choice we have been able to come up with is Dulera .  DuoNeb as needed.  She is having more wheeze, more shortness of breath.  Question whether this is in the setting of acute infection.  She may be a candidate to start Ohtuvayre  at some point or to consider scheduled prednisone .

## 2023-10-22 NOTE — Assessment & Plan Note (Signed)
 There is a new rounded opacity at the left base compared with her scan 3 months ago.  Given her clinical symptoms, question whether this may be pneumonia.  I will treat her with doxycycline and then repeat her CT in about 8 weeks.  If the lesion progresses or even if it does not resolve then we will need to think about possible biopsy and treatment options given her cancer history.

## 2023-11-23 ENCOUNTER — Ambulatory Visit: Payer: Self-pay | Admitting: Emergency Medicine

## 2023-11-23 NOTE — Telephone Encounter (Addendum)
 FYI Only or Action Required?: Action required by provider  Patient is followed in Pulmonology for COPD and pulmonary nodules, last seen on 10/22/2023 by Denson Flake, MD. Called Nurse Triage reporting Shortness of Breath. Symptoms began 2 weeks ago. Interventions attempted: Rescue inhaler, Maintenance inhaler, and Increased fluids/rest. Symptoms are: unchanged.  Triage Disposition: See HCP Within 4 Hours (Or PCP Triage)  Patient/caregiver understands and will follow disposition?: No, wishes to speak with PCP  Copied from CRM [id number]. Topic: Clinical - Red Word Triage >> Nov 23, 2023  3:03 PM Ilene Malling wrote: Red Word that prompted transfer to Nurse Triage: Patient [phone] states more of shortness of breath, wheezing, no dizziness in the last few weeks. Patient denies pain, nor headaches. Patient has a CT scan for 12/11/23. Patient is trying to schedule an appointment follow up for CT results. Please advise. Reason for Disposition  [1] Longstanding difficulty breathing AND [2] not responding to usual therapy  Answer Assessment - Initial Assessment Questions 1. RESPIRATORY STATUS: "Describe your breathing?" (e.g., wheezing, shortness of breath, unable to speak, severe coughing)      Shortness of breath 2. ONSET: "When did this breathing problem begin?"      2 weeks ago 3. PATTERN "Does the difficult breathing come and go, or has it been constant since it started?"      Comes and goes 4. SEVERITY: "How bad is your breathing?" (e.g., mild, moderate, severe)    - MILD: No SOB at rest, mild SOB with walking, speaks normally in sentences, can lie down, no retractions, pulse < 100.    - MODERATE: SOB at rest, SOB with minimal exertion and prefers to sit, cannot lie down flat, speaks in phrases, mild retractions, audible wheezing, pulse 100-120.    - SEVERE: Very SOB at rest, speaks in single words, struggling to breathe, sitting hunched forward, retractions, pulse > 120      Mild to moderate 5.  RECURRENT SYMPTOM: "Have you had difficulty breathing before?" If Yes, ask: "When was the last time?" and "What happened that time?"      yes 6. CARDIAC HISTORY: "Do you have any history of heart disease?" (e.g., heart attack, angina, bypass surgery, angioplasty)      no 7. LUNG HISTORY: "Do you have any history of lung disease?"  (e.g., pulmonary embolus, asthma, emphysema)     COPD, pulmonary nodules 8. CAUSE: "What do you think is causing the breathing problem?"      unsure 9. OTHER SYMPTOMS: "Do you have any other symptoms? (e.g., dizziness, runny nose, cough, chest pain, fever)     Runny nose, slight cough 10. O2 SATURATION MONITOR:  "Do you use an oxygen saturation monitor (pulse oximeter) at home?" If Yes, ask: "What is your reading (oxygen level) today?" "What is your usual oxygen saturation reading?" (e.g., 95%)       93% 12. TRAVEL: "Have you traveled out of the country in the last month?" (e.g., travel history, exposures)       No Patient calling primarily to get an appointment made for two weeks post her CT scan for 12/11/2023. Patient states her shortness of breath has increased but doesn't feel that she needs to be seen at this time  Protocols used: Breathing Difficulty-A-AH

## 2023-11-23 NOTE — Telephone Encounter (Signed)
 I spoke with the pt  She is specifically asking for appt for after CT Chest on 12/11/23  [facility] has no openings coming up, not even nodule slots  Sarah, since this is a nodule pt may we please use one of your held slots? Thank you!

## 2023-11-25 NOTE — Telephone Encounter (Addendum)
 Per Isa Manuel: [facility], can we get patient scheduled to see me after the CT Chest. I am off 6/27, but maybe that next week if I have any openings. Can we please find out what she needs to be seen for [facility]? Why is she requesting the appointment? Dyspnea? If she is flaring she should probably be treated before the scan.    I called the pt and there was no answer- LMTCB

## 2023-12-01 ENCOUNTER — Encounter: Payer: Self-pay | Admitting: Emergency Medicine

## 2023-12-02 NOTE — Telephone Encounter (Signed)
 Do we have anything any sooner with anyone else or a cancellation with Dr Baldwin Levee someone could reach out to the patient about?

## 2023-12-03 NOTE — Telephone Encounter (Signed)
 Closing this encounter since there is already another one regarding getting her in for appt with [facility]

## 2023-12-11 ENCOUNTER — Ambulatory Visit
Admission: RE | Admit: 2023-12-11 | Discharge: 2023-12-11 | Disposition: A | Discharge status: Home / Self Care | Source: Ambulatory Visit | Attending: Emergency Medicine | Admitting: Emergency Medicine

## 2023-12-11 DIAGNOSIS — Z902 Acquired absence of lung [part of]: Secondary | ICD-10-CM | POA: Diagnosis not present

## 2023-12-11 DIAGNOSIS — R918 Other nonspecific abnormal finding of lung field: Secondary | ICD-10-CM | POA: Diagnosis not present

## 2023-12-15 ENCOUNTER — Ambulatory Visit: Payer: Self-pay | Admitting: Hematology and Oncology

## 2023-12-16 ENCOUNTER — Inpatient Hospital Stay: Attending: Hematology and Oncology | Admitting: Hematology and Oncology

## 2023-12-16 ENCOUNTER — Inpatient Hospital Stay

## 2023-12-16 VITALS — BP 122/62 | HR 95 | Temp 100.1°F | Resp 17 | Ht 63.0 in | Wt 106.0 lb

## 2023-12-16 DIAGNOSIS — Z902 Acquired absence of lung [part of]: Secondary | ICD-10-CM | POA: Diagnosis not present

## 2023-12-16 DIAGNOSIS — D6851 Activated protein C resistance: Secondary | ICD-10-CM | POA: Insufficient documentation

## 2023-12-16 DIAGNOSIS — R918 Other nonspecific abnormal finding of lung field: Secondary | ICD-10-CM | POA: Insufficient documentation

## 2023-12-16 DIAGNOSIS — C3490 Malignant neoplasm of unspecified part of unspecified bronchus or lung: Secondary | ICD-10-CM | POA: Insufficient documentation

## 2023-12-16 DIAGNOSIS — Z853 Personal history of malignant neoplasm of breast: Secondary | ICD-10-CM | POA: Diagnosis not present

## 2023-12-16 DIAGNOSIS — Z170 Estrogen receptor positive status [ER+]: Secondary | ICD-10-CM

## 2023-12-16 DIAGNOSIS — Z800 Family history of malignant neoplasm of digestive organs: Secondary | ICD-10-CM | POA: Diagnosis not present

## 2023-12-16 DIAGNOSIS — Z87891 Personal history of nicotine dependence: Secondary | ICD-10-CM | POA: Diagnosis not present

## 2023-12-16 DIAGNOSIS — C50512 Malignant neoplasm of lower-outer quadrant of left female breast: Secondary | ICD-10-CM

## 2023-12-16 DIAGNOSIS — Z803 Family history of malignant neoplasm of breast: Secondary | ICD-10-CM | POA: Insufficient documentation

## 2023-12-16 DIAGNOSIS — R634 Abnormal weight loss: Secondary | ICD-10-CM | POA: Diagnosis not present

## 2023-12-16 NOTE — Progress Notes (Signed)
 [facility]  [phone] [phone]) [phone]     ID: [name] DOB: 04-12-1946  MR#: [id number]  RDW#:[id number]  Patient Care Team: Jessica Motto, DO as PCP - General (Family Medicine) Jessica Gilbert, Jessica BROCKS, MD (Inactive) as Consulting Physician (Oncology) Jessica Domino, MD as Attending Physician (Radiation Oncology) Jessica Deward MOULD, MD as Consulting Physician (General Surgery) Jessica Stephane BROCKS, RN (Inactive) as Oncology Nurse Navigator Jessica Nanetta SAILOR, RN as Oncology Nurse Navigator  CHIEF COMPLAINT:  Estrogen receptor positive breast cancer  CURRENT TREATMENT: Observation  Discussed the use of AI scribe software for clinical note transcription with the patient, who gave verbal consent to proceed.  History of Present Illness Jessica Gilbert is a 78 year old female with lung cancer who presents with new lung nodules. She was referred by Dr. Shelah for evaluation of new lung nodules.  She has a history of lung cancer diagnosed in 2005 and has undergone previous treatments including chemotherapy and radiation. Recent CT scans revealed new lung nodules. In September 2023, a biopsy identified non-small cell lung cancer  She experiences baseline shortness of breath. In February, her oxygen level was 100% in a doctor's office, which she found surprising. Her oxygen levels have since been around 92%.  She reports unintentional weight loss, having dropped to 101 pounds at one point, but has since gained back to 106 pounds. She attributes this to not eating enough, despite efforts to gain weight. No changes in bowel habits are noted, but she mentions a bothersome hernia, for which she has an upcoming appointment with her primary care physician.    Rest of the pertinent 10 point ROS reviewed and negative   COVID 19 VACCINATION STATUS: Status post Moderna x4,   BREAST CANCER HISTORY: From the original intake note:   Jessica Gilbert (pronounced ro-lund) had  bilateral screening mammography at the breast Center 05/26/2014 showing a possible mass in the left breast. Left digital mammography with ultrasonography 06/14/2014 found a breast density to be category C. There was no persistent mass or distortion or malignant-type microcalcifications. . There was no palpable mass. Ultrasound showed a well circumscribed benign appearing nodule in the left breast 2:00 position measuring 4 mm. This was felt to be most likely benign but repeat ultrasound  At 6 months was recommended and thiswas performed 12/14/2014.  The mass was unchanged an incidental note was made of a benign appearing 0.5 cm lymph node in the left breast 2:00 position.   On 06/15/2015 Jessica had bilateral diagnostic mammography with tomosynthesis and this time an irregular mass with spiculated margins was noted in the outer lower left breast measuring approximately 9 mm. Physical examination confirmed a firm fixed nodule at the 3:30 o'clock position an ultrasound of this area confirmed an irregular hypoechoic mass measuring 0.7 cm. There was a 0.2 cm adjacent satellite nodule. There was no left axillary lymphadenopathy.   biopsy of this mass was obtained 06/15/2015, and showed ([id number]) an invasive ductal carcinoma, grade 1, estrogen receptor 100% positive, progesterone receptor 40% positive, both with strong staining intensity, with an MIB-1 of 11%, and no HER-2 amplification, the signals ratio being 1.39 and the number per cell 2.15.   [name] also has a history of prior tobacco abuse, status post right upper lobectomy for lung cancer, with significant emphysema.   The patient's subsequent history is as detailed below   PAST MEDICAL HISTORY: Past Medical History:  Diagnosis Date   Acute respiratory failure (HCC)    Breast  cancer [facility])    left breast   Carotid stenosis, right    Celiac disease    Dyspnea    Emphysema lung (HCC)    Factor 5 Leiden mutation, heterozygous (HCC)    States no  symptoms   History of hiatal hernia    History of radiation therapy    Left Lung-03/24/22-04/04/22- Dr. Lynwood Nasuti   Human metapneumovirus pneumonia 08/2013   Hyperlipidemia    Hypertension    Lung cancer (HCC) 2005   Rt upper lobe   Stroke (HCC)    TIA w/o defecits    PAST SURGICAL HISTORY: Past Surgical History:  Procedure Laterality Date   BREAST LUMPECTOMY     left    BREAST LUMPECTOMY WITH RADIOACTIVE SEED AND SENTINEL LYMPH NODE BIOPSY Left 07/17/2015   Procedure: LEFT BREAST LUMPECTOMY WITH RADIOACTIVE SEED WITH LEFT AXILLARY SENTINEL LYMPH NODE BIOPSY;  Surgeon: Alm Angle, MD;  Location: [facility];  Service: General;  Laterality: Left;   BREAST LUMPECTOMY WITH RADIOACTIVE SEED AND SENTINEL LYMPH NODE BIOPSY Left 05/28/2020   Procedure: LEFT BREAST LUMPECTOMY WITH RADIOACTIVE SEED AND SENTINEL LYMPH NODE BIOPSY;  Surgeon: Jessica Deward MOULD, MD;  Location: [facility];  Service: General;  Laterality: Left;   BRONCHIAL BIOPSY  02/24/2022   Procedure: BRONCHIAL BIOPSIES;  Surgeon: Jessica Gilbert Lamar RAMAN, MD;  Location: [facility];  Service: Pulmonary;;   BRONCHIAL BRUSHINGS  02/24/2022   Procedure: BRONCHIAL BRUSHINGS;  Surgeon: Jessica Gilbert Lamar RAMAN, MD;  Location: [facility];  Service: Pulmonary;;   BRONCHIAL NEEDLE ASPIRATION BIOPSY  02/24/2022   Procedure: BRONCHIAL NEEDLE ASPIRATION BIOPSIES;  Surgeon: Jessica Gilbert Lamar RAMAN, MD;  Location: [facility];  Service: Pulmonary;;   BRONCHIAL WASHINGS  02/24/2022   Procedure: BRONCHIAL WASHINGS;  Surgeon: Jessica Gilbert Lamar RAMAN, MD;  Location: [facility];  Service: Pulmonary;;   FIDUCIAL MARKER PLACEMENT  02/24/2022   Procedure: FIDUCIAL MARKER PLACEMENT;  Surgeon: Jessica Gilbert Lamar RAMAN, MD;  Location: [facility];  Service: Pulmonary;;   LUNG REMOVAL, PARTIAL Right 2005   Upper lobe   TRIGGER FINGER RELEASE     TUBAL LIGATION  1980   VIDEO BRONCHOSCOPY WITH RADIAL ENDOBRONCHIAL ULTRASOUND  02/24/2022   Procedure: VIDEO  BRONCHOSCOPY WITH RADIAL ENDOBRONCHIAL ULTRASOUND;  Surgeon: Jessica Gilbert Lamar RAMAN, MD;  Location: [facility];  Service: Pulmonary;;    FAMILY HISTORY Family History  Problem Relation Age of Onset   Heart disease Father    High blood pressure Father    Congestive Heart Failure Father    Leukemia Mother    Colon polyps Mother    High blood pressure Brother    Lung cancer Brother    Heart disease Brother    Diabetes Brother    Colon polyps Sister    Breast cancer Sister    High blood pressure Sister    Emphysema Sister    Pancreatic cancer Paternal Uncle    the patient's father died at the age of 80 with congestive 40 failure. Patient's mother died with acute leukemia at the age of 62. The patient had one brother, 2 sisters. One sister had breast cancer at age 16. One brother, smoker, was diagnosed with lung cancer at the age of 42. There is a paternal uncle who may have had pancreatic cancer. There is no history of ovarian cancer in the family.   GYNECOLOGIC HISTORY:  No LMP recorded. Patient is postmenopausal.  menarche age 27, first live birth age 78. The patient is GX P2. She stopped having  periods in 1998, took hormone replacement approximately 6 months. Remotely she took oral contraceptives about 3 years with no complications   SOCIAL HISTORY: (updated November 2022)  Jessica worked as an Airline pilot.  She describes herself is single. At home she lives by herself, with 3 cats. Her son Eliannah Hinde lives in [place] and was recently promoted to Archivist in [employer]. The patient's other son died 13 years ago. Brett has 2 grandchildren, a Engineer, maintenance (IT) who graduated from [employer] and now is working towards a PhD and a grandson who works for [employer] in [place].. She is a International aid/development worker.    ADVANCED DIRECTIVES:  In place. Her son Garrel is her healthcare power of attorney. He can be reached at [phone]   HEALTH MAINTENANCE: Social History   Tobacco Use   Smoking status:  Former    Current packs/day: 0.00    Average packs/day: 1 pack/day for 35.0 years (35.0 ttl pk-yrs)    Types: Cigarettes    Start date: 01/28/1969    Quit date: 01/29/2004    Years since quitting: 19.8   Smokeless tobacco: Never  Vaping Use   Vaping status: Never Used  Substance Use Topics   Alcohol use: Not Currently   Drug use: No     Colonoscopy:  PAP:  Bone density: remote.   Lipid panel:  Allergies  Allergen Reactions   Aspirin Other (See Comments)    burning stomach   Codeine Nausea Only   Vitamin D Analogs Other (See Comments)    Optical migraines     Current Outpatient Medications  Medication Sig Dispense Refill   albuterol  (VENTOLIN  HFA) 108 (90 Base) MCG/ACT inhaler Inhale 1 puff into the lungs every 4 (four) hours as needed for shortness of breath.     diphenhydrAMINE (BENADRYL ALLERGY) 25 MG tablet Take 25 mg by mouth at bedtime as needed for allergies.     diphenhydrAMINE (BENADRYL) 25 MG tablet Take 25 mg by mouth at bedtime.     doxycycline  (VIBRA -TABS) 100 MG tablet Take 1 tablet (100 mg total) by mouth 2 (two) times daily. 14 tablet 0   famotidine (PEPCID AC) 10 MG tablet Take 10 mg by mouth daily.     fluticasone (FLONASE SENSIMIST) 27.5 MCG/SPRAY nasal spray Place 1 spray into the nose daily.     guaiFENesin  (MUCINEX ) 600 MG 12 hr tablet Take 600 mg by mouth 2 (two) times daily as needed for to loosen phlegm.     ibuprofen (ADVIL,MOTRIN) 200 MG tablet Take 200 mg by mouth every 6 (six) hours as needed for moderate pain.     ipratropium (ATROVENT ) 0.03 % nasal spray Place 2 sprays into both nostrils 2 (two) times daily as needed for rhinitis. 30 mL 12   ipratropium-albuterol  (DUONEB) 0.5-2.5 (3) MG/3ML SOLN Take 3 mLs by nebulization every 6 (six) hours as needed. 120 mL 5   mometasone -formoterol  (DULERA ) 200-5 MCG/ACT AERO Inhale 2 puffs into the lungs 2 (two) times daily. 1 each 5   Multiple Vitamin (MULTIVITAMIN WITH MINERALS) TABS tablet Take 1 tablet  by mouth daily.     Nutritional Supplements (HIGH-PROTEIN NUTRITIONAL SHAKE PO) Take by mouth. 1 time a day     telmisartan (MICARDIS) 40 MG tablet Take 40 mg by mouth daily.     tetrahydrozoline-zinc (VISINE-AC) 0.05-0.25 % ophthalmic solution Place 1 drop into both eyes 3 (three) times daily as needed (dry eyes).     No current facility-administered medications for this visit.    OBJECTIVE:  There were no vitals filed for this visit.      There is no height or weight on file to calculate BMI.    ECOG FS: There were no vitals filed for this visit.   Physical Exam Constitutional:      Appearance: Normal appearance.  Cardiovascular:     Rate and Rhythm: Normal rate and regular rhythm.  Pulmonary:     Comments: Very poor inspiratory effort.  No adventitious sounds Musculoskeletal:     Cervical back: Normal range of motion and neck supple. No rigidity.  Lymphadenopathy:     Cervical: No cervical adenopathy.  Neurological:     Mental Status: She is alert.      LAB RESULTS:  CMP     Component Value Date/Time   NA 140 02/24/2022 0544   NA 140 10/08/2016 1152   K 4.1 02/24/2022 0544   K 4.3 10/08/2016 1152   CL 105 02/24/2022 0544   CO2 26 02/24/2022 0544   CO2 25 10/08/2016 1152   GLUCOSE 105 (H) 02/24/2022 0544   GLUCOSE 98 10/08/2016 1152   BUN 11 02/24/2022 0544   BUN 12.4 10/08/2016 1152   CREATININE 0.87 02/24/2022 0544   CREATININE 0.81 12/12/2021 1511   CREATININE 0.8 10/08/2016 1152   CALCIUM 9.3 02/24/2022 0544   CALCIUM 9.7 10/08/2016 1152   PROT 6.9 12/12/2021 1511   PROT 7.1 10/08/2016 1152   ALBUMIN 4.3 12/12/2021 1511   ALBUMIN 4.1 10/08/2016 1152   AST 23 12/12/2021 1511   AST 23 10/08/2016 1152   ALT 20 12/12/2021 1511   ALT 18 10/08/2016 1152   ALKPHOS 61 12/12/2021 1511   ALKPHOS 75 10/08/2016 1152   BILITOT 0.3 12/12/2021 1511   BILITOT 0.42 10/08/2016 1152   GFRNONAA >60 02/24/2022 0544   GFRNONAA >60 12/12/2021 1511   GFRAA >60  01/09/2020 1503    INo results found for: SPEP, UPEP  Lab Results  Component Value Date   WBC 9.0 02/24/2022   NEUTROABS 5.5 12/12/2021   HGB 13.4 02/24/2022   HCT 40.6 02/24/2022   MCV 88.1 02/24/2022   PLT 280 02/24/2022      Chemistry      Component Value Date/Time   NA 140 02/24/2022 0544   NA 140 10/08/2016 1152   K 4.1 02/24/2022 0544   K 4.3 10/08/2016 1152   CL 105 02/24/2022 0544   CO2 26 02/24/2022 0544   CO2 25 10/08/2016 1152   BUN 11 02/24/2022 0544   BUN 12.4 10/08/2016 1152   CREATININE 0.87 02/24/2022 0544   CREATININE 0.81 12/12/2021 1511   CREATININE 0.8 10/08/2016 1152      Component Value Date/Time   CALCIUM 9.3 02/24/2022 0544   CALCIUM 9.7 10/08/2016 1152   ALKPHOS 61 12/12/2021 1511   ALKPHOS 75 10/08/2016 1152   AST 23 12/12/2021 1511   AST 23 10/08/2016 1152   ALT 20 12/12/2021 1511   ALT 18 10/08/2016 1152   BILITOT 0.3 12/12/2021 1511   BILITOT 0.42 10/08/2016 1152       No results found for: LABCA2  No components found for: LABCA125  No results for input(s): INR in the last 168 hours.  Urinalysis No results found for: COLORURINE, APPEARANCEUR, LABSPEC, PHURINE, GLUCOSEU, HGBUR, BILIRUBINUR, KETONESUR, PROTEINUR, UROBILINOGEN, NITRITE, LEUKOCYTESUR   STUDIES: CT Super D Chest Wo Contrast Result Date: 12/11/2023 CLINICAL DATA:  Follow-up pulmonary nodule , chronic shortness of breath history EXAM: CT CHEST WITHOUT CONTRAST TECHNIQUE: Multidetector CT imaging of  the chest was performed using thin slice collimation for electromagnetic bronchoscopy planning purposes, without intravenous contrast. RADIATION DOSE REDUCTION: This exam was performed according to the departmental dose-optimization program which includes automated exposure control, adjustment of the mA and/or kV according to patient size and/or use of iterative reconstruction technique. COMPARISON:  Chest CT September 17, 2023 FINDINGS:  Cardiovascular: The heart size is normal. Small pericardial fluid, stable to prior. Aortic and coronary artery calcifications. Mediastinum/Nodes: Subcentimeter multi station mediastinal lymph nodes, similar to prior. Lungs/Pleura: Right upper lobectomy with elevation of major fissure . Limited evaluation of the hilar structures given the lack of contrast however surgical stump is similar in appearance when compared to prior at the right hilum. Surgical clips identified in left lung apex. Moderate to severe centrilobular emphysematous changes. Multiple pulmonary nodules are identified as follows: Left lower lobe subpleural nodule measuring 5 mm (4/127), stable. Subpleural left lower lobe nodule measuring 6 mm (5/115), new to prior. Left lower lobe pulmonary nodule measuring 9 mm (5/91, new to prior Left lower lobe subpleural nodule measuring 1.3 cm (5/106), new to prior . Additional stable scattered nodule up to 5 mm for example in left lower lobe (5/86, 78, 67, 116), left upper lobe (5/27). There is a left apical nodule measuring 10 x 5 mm (5/22), stable. Additional nodule in the left lung apex measures 7 mm (5/16), stable. Subpleural consolidation/nodule in the left lung base measuring 1.7 x 0.9 cm (5/153), decreased in size to prior previously measured 2.6 x 1.4 cm . Subpleural nodule/nodular scars measuring 1.2 x 0.6 cm and 7.2 x 6 cm in medial aspect of the left lung apex, similar to prior. Trachea and major airways are patent.  No pleural effusion. Upper Abdomen: Small to moderate-sized hiatal hernia. Punctate calcifications of the spleen suggestive of prior granulomatous disease. Musculoskeletal: A sclerotic lesion in T5 vertebral body likely bone island. Degenerative changes of the spine. IMPRESSION: Postsurgical changes of right upper lobectomy without suspicious findings (within the limitations of noncontrast CT) to suggest local recurrence. Multiple bilateral pulmonary nodules including new nodules  predominantly in left lower lobe suggestive of metastatic disease. A subpleural left lung base consolidation/atelectasis is smaller to prior. Remainder of the nodules are grossly stable. Follow-up according to oncologic protocols. Pan lobar centrilobular emphysematous changes. Electronically Signed   By: Megan  Zare M.D.   On: 12/11/2023 18:00     ASSESSMENT: 78 y.o.  [place] woman status post left breast lower outer quadrant biopsy 06/15/2015 for a clinical mT1c N0, stage IA  Invasive ductal carcinoma, grade 1, estrogen and progesterone receptor positive, HER-2 not amplified, with an MIB-1 of 11%  (1) status post left lumpectomy with sentinel lymph node sampling 07/17/2015 for a pT1b pN0, stage IA invasive ductal carcinoma, grade 2, with negative though close margins; repeat HER-2 was again negative, and estrogen and progesterone receptors were again positive  (2) Oncotype DX score of 16 predicts an outside the breast risk of recurrence within 10 years of 10% if the patient's only systemic therapy is tamoxifen for 5 years. It also her next no benefit from chemotherapy  (3)  The patient opted against adjuvant radiation  (4)  Anastrozole  started 09/07/2015 discontinued May 2017 with intolerance  (a) exemestane  started June 2017, discontinued August 2017 with intolerance.  (b) bone density at the [facility] 11/05/2015 shows a T score of -2.0   (5) Factor V Leiden positive: elevated clotting risk with any procedure, immobilization, or procoagulant drugs such as estrogen  (6) s/p  right upper lobectomy (after neoadjuvant chemo-radiation) August  2005 for a primary lung cancer: Non-small cell carcinoma, with a 4 cm area of tumor regression and necrosis and rare atypical cells but no obvious viable tumor remaining, and a total of 9 regional lymph nodes (N1, and 2) all negative.  (7) status post left breast upper outer quadrant 05/01/2020 shows invasive ductal carcinoma, low-grade, estrogen and  progesterone receptor positive, HER-2 not amplified, with an MIB-1 of 25%  (8) status post left lumpectomy and sentinel lymph node sampling 05/28/2020 for a pT1b pN0, stage IA invasive ductal carcinoma, grade 2, with negative margins.  (a) a single left axillary lymph node removed  (b) status post chemoradiation for right upper lobectomy, making adjuvant radiation for her breast cancer problematic  (9) fulvestrant was to start 07/13/2019, but patient opted against  (a) patient is intolerant of aromatase inhibitors and tamoxifen is contraindicated (#5)   PLAN:   Assessment and Plan Assessment & Plan Non-small cell lung cancer New lung nodules may indicate progression or new metastasis.  Recommended PET scan. Order guardant 360 today Will try to obtain tissue sample from Sep 2023 and see if we can run foundation one and PDL1 on it If none of these efforts show any targetable mutations, reasonable to obtain another biopsy. She is agreeable. She says she doesn't want any more radiation  History of breast cancer twice Last mammogram Jan 2025 neg for malignancy She declined antiestrogens. Continue to monitor.   Total time spent:40 minutes  *Total Encounter Time as defined by the Centers for Medicare and Medicaid Services includes, in addition to the face-to-face time of a patient visit (documented in the note above) non-face-to-face time: obtaining and reviewing outside history, ordering and reviewing medications, tests or procedures, care coordination (communications with other health care professionals or caregivers) and documentation in the medical record.

## 2023-12-24 DIAGNOSIS — C50112 Malignant neoplasm of central portion of left female breast: Secondary | ICD-10-CM | POA: Diagnosis not present

## 2023-12-28 ENCOUNTER — Encounter: Payer: Self-pay | Admitting: Hematology and Oncology

## 2023-12-30 LAB — GUARDANT 360

## 2024-01-04 ENCOUNTER — Ambulatory Visit (HOSPITAL_COMMUNITY)
Admission: RE | Admit: 2024-01-04 | Discharge: 2024-01-04 | Disposition: A | Discharge status: Home / Self Care | Source: Ambulatory Visit | Attending: Hematology and Oncology | Admitting: Hematology and Oncology

## 2024-01-04 DIAGNOSIS — Z170 Estrogen receptor positive status [ER+]: Secondary | ICD-10-CM | POA: Diagnosis not present

## 2024-01-04 DIAGNOSIS — C50512 Malignant neoplasm of lower-outer quadrant of left female breast: Secondary | ICD-10-CM | POA: Insufficient documentation

## 2024-01-04 DIAGNOSIS — C3411 Malignant neoplasm of upper lobe, right bronchus or lung: Secondary | ICD-10-CM | POA: Diagnosis not present

## 2024-01-04 LAB — GLUCOSE, CAPILLARY: Glucose-Capillary: 118 mg/dL — ABNORMAL HIGH (ref 70–99)

## 2024-01-04 MED ORDER — FLUDEOXYGLUCOSE F - 18 (FDG) INJECTION
5.3000 | Freq: Once | INTRAVENOUS | Status: AC | PRN
  Administered 2024-01-04: 5.3 via INTRAVENOUS

## 2024-01-07 ENCOUNTER — Ambulatory Visit: Payer: Self-pay | Admitting: Hematology and Oncology

## 2024-01-07 DIAGNOSIS — Z8679 Personal history of other diseases of the circulatory system: Secondary | ICD-10-CM | POA: Diagnosis not present

## 2024-01-07 DIAGNOSIS — Z0000 Encounter for general adult medical examination without abnormal findings: Secondary | ICD-10-CM | POA: Diagnosis not present

## 2024-01-07 DIAGNOSIS — L989 Disorder of the skin and subcutaneous tissue, unspecified: Secondary | ICD-10-CM | POA: Diagnosis not present

## 2024-01-07 DIAGNOSIS — I779 Disorder of arteries and arterioles, unspecified: Secondary | ICD-10-CM | POA: Diagnosis not present

## 2024-01-07 DIAGNOSIS — E559 Vitamin D deficiency, unspecified: Secondary | ICD-10-CM | POA: Diagnosis not present

## 2024-01-07 DIAGNOSIS — R7401 Elevation of levels of liver transaminase levels: Secondary | ICD-10-CM | POA: Diagnosis not present

## 2024-01-07 DIAGNOSIS — E7800 Pure hypercholesterolemia, unspecified: Secondary | ICD-10-CM | POA: Diagnosis not present

## 2024-01-12 ENCOUNTER — Telehealth: Payer: Self-pay

## 2024-01-12 NOTE — Telephone Encounter (Signed)
 Pt verbally confirmed appt for 7/30

## 2024-01-13 ENCOUNTER — Inpatient Hospital Stay: Admitting: Hematology and Oncology

## 2024-01-13 VITALS — BP 121/63 | HR 88 | Temp 99.2°F | Resp 18 | Wt 105.3 lb

## 2024-01-13 DIAGNOSIS — Z170 Estrogen receptor positive status [ER+]: Secondary | ICD-10-CM

## 2024-01-13 DIAGNOSIS — Z853 Personal history of malignant neoplasm of breast: Secondary | ICD-10-CM | POA: Diagnosis not present

## 2024-01-13 DIAGNOSIS — D6851 Activated protein C resistance: Secondary | ICD-10-CM | POA: Diagnosis not present

## 2024-01-13 DIAGNOSIS — Z800 Family history of malignant neoplasm of digestive organs: Secondary | ICD-10-CM | POA: Diagnosis not present

## 2024-01-13 DIAGNOSIS — R634 Abnormal weight loss: Secondary | ICD-10-CM | POA: Diagnosis not present

## 2024-01-13 DIAGNOSIS — C50512 Malignant neoplasm of lower-outer quadrant of left female breast: Secondary | ICD-10-CM | POA: Diagnosis not present

## 2024-01-13 DIAGNOSIS — C3490 Malignant neoplasm of unspecified part of unspecified bronchus or lung: Secondary | ICD-10-CM | POA: Diagnosis not present

## 2024-01-13 DIAGNOSIS — Z902 Acquired absence of lung [part of]: Secondary | ICD-10-CM | POA: Diagnosis not present

## 2024-01-13 DIAGNOSIS — R918 Other nonspecific abnormal finding of lung field: Secondary | ICD-10-CM | POA: Diagnosis not present

## 2024-01-13 DIAGNOSIS — Z803 Family history of malignant neoplasm of breast: Secondary | ICD-10-CM | POA: Diagnosis not present

## 2024-01-13 DIAGNOSIS — Z87891 Personal history of nicotine dependence: Secondary | ICD-10-CM | POA: Diagnosis not present

## 2024-01-13 NOTE — Progress Notes (Signed)
 [facility]  [phone] [phone]     ID: Jessica Gilbert DOB: [birth year]  MR#: [id number]  RDW#:[id number]  Patient Care Team: Dayna Motto, DO as PCP - General (Family Medicine) Izell Domino, MD as Attending Physician (Radiation Oncology) Curvin Deward MOULD, MD as Consulting Physician (General Surgery) Glean Stephane BROCKS, RN (Inactive) as Oncology Nurse Navigator Tyree Nanetta SAILOR, RN as Oncology Nurse Navigator  CHIEF COMPLAINT:  Estrogen receptor positive breast cancer  CURRENT TREATMENT: Observation  Discussed the use of AI scribe software for clinical note transcription with the patient, who gave verbal consent to proceed.  History of Present Illness  Jessica Gilbert is a 78 year old female who presents for evaluation of lung nodules and right breast abnormality.  She has history of NSCLC and left breast IDC, here for follow up to review PET results and Guardant 360 results. Since last visit,   No new symptoms or significant changes in her condition.   Her sister was with her today.    Rest of the pertinent 10 point ROS reviewed and negative   COVID 19 VACCINATION STATUS: Status post Moderna x4,   BREAST CANCER HISTORY: From the original intake note:   [name] (pronounced ro-lund) had bilateral screening mammography at the breast Center 05/26/2014 showing a possible mass in the left breast. Left digital mammography with ultrasonography 06/14/2014 found a breast density to be category C. There was no persistent mass or distortion or malignant-type microcalcifications. . There was no palpable mass. Ultrasound showed a well circumscribed benign appearing nodule in the left breast 2:00 position measuring 4 mm. This was felt to be most likely benign but repeat ultrasound  At 6 months was recommended and thiswas performed 12/14/2014.  The mass was unchanged an incidental note was made of a benign appearing 0.5 cm lymph node in the left  breast 2:00 position.   On 06/15/2015 Inocente had bilateral diagnostic mammography with tomosynthesis and this time an irregular mass with spiculated margins was noted in the outer lower left breast measuring approximately 9 mm. Physical examination confirmed a firm fixed nodule at the 3:30 o'clock position an ultrasound of this area confirmed an irregular hypoechoic mass measuring 0.7 cm. There was a 0.2 cm adjacent satellite nodule. There was no left axillary lymphadenopathy.   biopsy of this mass was obtained 06/15/2015, and showed ([id number]) an invasive ductal carcinoma, grade 1, estrogen receptor 100% positive, progesterone receptor 40% positive, both with strong staining intensity, with an MIB-1 of 11%, and no HER-2 amplification, the signals ratio being 1.39 and the number per cell 2.15.   Tylah also has a history of prior tobacco abuse, status post right upper lobectomy for lung cancer, with significant emphysema.   The patient's subsequent history is as detailed below   PAST MEDICAL HISTORY: Past Medical History:  Diagnosis Date   Acute respiratory failure (HCC)    Breast cancer (HCC)    left breast   Carotid stenosis, right    Celiac disease    Dyspnea    Emphysema lung (HCC)    Factor 5 Leiden mutation, heterozygous (HCC)    States no symptoms   History of hiatal hernia    History of radiation therapy    Left Lung-03/24/22-04/04/22- Dr. Lynwood Nasuti   Human metapneumovirus pneumonia 08/2013   Hyperlipidemia    Hypertension    Lung cancer (HCC) 2005   Rt upper lobe   Stroke [facility])    TIA w/o defecits  PAST SURGICAL HISTORY: Past Surgical History:  Procedure Laterality Date   BREAST LUMPECTOMY     left    BREAST LUMPECTOMY WITH RADIOACTIVE SEED AND SENTINEL LYMPH NODE BIOPSY Left 07/17/2015   Procedure: LEFT BREAST LUMPECTOMY WITH RADIOACTIVE SEED WITH LEFT AXILLARY SENTINEL LYMPH NODE BIOPSY;  Surgeon: Alm Angle, MD;  Location: [facility];   Service: General;  Laterality: Left;   BREAST LUMPECTOMY WITH RADIOACTIVE SEED AND SENTINEL LYMPH NODE BIOPSY Left 05/28/2020   Procedure: LEFT BREAST LUMPECTOMY WITH RADIOACTIVE SEED AND SENTINEL LYMPH NODE BIOPSY;  Surgeon: Curvin Deward MOULD, MD;  Location: [facility];  Service: General;  Laterality: Left;   BRONCHIAL BIOPSY  02/24/2022   Procedure: BRONCHIAL BIOPSIES;  Surgeon: Shelah Lamar RAMAN, MD;  Location: [facility] ENDOSCOPY;  Service: Pulmonary;;   BRONCHIAL BRUSHINGS  02/24/2022   Procedure: BRONCHIAL BRUSHINGS;  Surgeon: Shelah Lamar RAMAN, MD;  Location: [facility] ENDOSCOPY;  Service: Pulmonary;;   BRONCHIAL NEEDLE ASPIRATION BIOPSY  02/24/2022   Procedure: BRONCHIAL NEEDLE ASPIRATION BIOPSIES;  Surgeon: Shelah Lamar RAMAN, MD;  Location: [facility] ENDOSCOPY;  Service: Pulmonary;;   BRONCHIAL WASHINGS  02/24/2022   Procedure: BRONCHIAL WASHINGS;  Surgeon: Shelah Lamar RAMAN, MD;  Location: [facility] ENDOSCOPY;  Service: Pulmonary;;   FIDUCIAL MARKER PLACEMENT  02/24/2022   Procedure: FIDUCIAL MARKER PLACEMENT;  Surgeon: Shelah Lamar RAMAN, MD;  Location: [facility] ENDOSCOPY;  Service: Pulmonary;;   LUNG REMOVAL, PARTIAL Right 2005   Upper lobe   TRIGGER FINGER RELEASE     TUBAL LIGATION  1980   VIDEO BRONCHOSCOPY WITH RADIAL ENDOBRONCHIAL ULTRASOUND  02/24/2022   Procedure: VIDEO BRONCHOSCOPY WITH RADIAL ENDOBRONCHIAL ULTRASOUND;  Surgeon: Shelah Lamar RAMAN, MD;  Location: [facility] ENDOSCOPY;  Service: Pulmonary;;    FAMILY HISTORY Family History  Problem Relation Age of Onset   Heart disease Father    High blood pressure Father    Congestive Heart Failure Father    Leukemia Mother    Colon polyps Mother    High blood pressure Brother    Lung cancer Brother    Heart disease Brother    Diabetes Brother    Colon polyps Sister    Breast cancer Sister    High blood pressure Sister    Emphysema Sister    Pancreatic cancer Paternal Uncle    the patient's father died at the age of 19 with congestive 40 failure. Patient's  mother died with acute leukemia at the age of 35. The patient had one brother, 2 sisters. One sister had breast cancer at age 10. One brother, smoker, was diagnosed with lung cancer at the age of 38. There is a paternal uncle who may have had pancreatic cancer. There is no history of ovarian cancer in the family.   GYNECOLOGIC HISTORY:  No LMP recorded. Patient is postmenopausal.  menarche age 27, first live birth age 40. The patient is GX P2. She stopped having periods in 1998, took hormone replacement approximately 6 months. Remotely she took oral contraceptives about 3 years with no complications   SOCIAL HISTORY: (updated November 2022)  Inocente worked as an Airline pilot.  She describes herself is single. At home she lives by herself, with 3 cats. Her son Trezure Cronk lives in [place] and was recently promoted to Archivist in [employer]. The patient's other son died 13 years ago. Gerene has 2 grandchildren, a Engineer, maintenance (IT) who graduated from [employer] and now is working towards a PhD and a grandson who works for [employer] in [place].. She is  a [employer].    ADVANCED DIRECTIVES:  In place. Her son Garrel is her healthcare power of attorney. He can be reached at [phone]   HEALTH MAINTENANCE: Social History   Tobacco Use   Smoking status: Former    Current packs/day: 0.00    Average packs/day: 1 pack/day for 35.0 years (35.0 ttl pk-yrs)    Types: Cigarettes    Start date: 01/28/1969    Quit date: 01/29/2004    Years since quitting: 19.9   Smokeless tobacco: Never  Vaping Use   Vaping status: Never Used  Substance Use Topics   Alcohol use: Not Currently   Drug use: No     Colonoscopy:  PAP:  Bone density: remote.   Lipid panel:  Allergies  Allergen Reactions   Aspirin Other (See Comments)    burning stomach   Codeine Nausea Only   Vitamin D Analogs Other (See Comments)    Optical migraines     Current Outpatient Medications  Medication Sig Dispense Refill    albuterol  (VENTOLIN  HFA) 108 (90 Base) MCG/ACT inhaler Inhale 1 puff into the lungs every 4 (four) hours as needed for shortness of breath.     diphenhydrAMINE (BENADRYL ALLERGY) 25 MG tablet Take 25 mg by mouth at bedtime as needed for allergies.     doxycycline  (VIBRA -TABS) 100 MG tablet Take 1 tablet (100 mg total) by mouth 2 (two) times daily. 14 tablet 0   famotidine (PEPCID AC) 10 MG tablet Take 10 mg by mouth daily.     fluticasone (FLONASE SENSIMIST) 27.5 MCG/SPRAY nasal spray Place 1 spray into the nose daily.     guaiFENesin  (MUCINEX ) 600 MG 12 hr tablet Take 600 mg by mouth 2 (two) times daily as needed for to loosen phlegm.     ibuprofen (ADVIL,MOTRIN) 200 MG tablet Take 200 mg by mouth every 6 (six) hours as needed for moderate pain.     ipratropium (ATROVENT ) 0.03 % nasal spray Place 2 sprays into both nostrils 2 (two) times daily as needed for rhinitis. 30 mL 12   ipratropium-albuterol  (DUONEB) 0.5-2.5 (3) MG/3ML SOLN Take 3 mLs by nebulization every 6 (six) hours as needed. 120 mL 5   mometasone -formoterol  (DULERA ) 200-5 MCG/ACT AERO Inhale 2 puffs into the lungs 2 (two) times daily. 1 each 5   Multiple Vitamin (MULTIVITAMIN WITH MINERALS) TABS tablet Take 1 tablet by mouth daily.     Nutritional Supplements (HIGH-PROTEIN NUTRITIONAL SHAKE PO) Take by mouth. 1 time a day     telmisartan (MICARDIS) 40 MG tablet Take 40 mg by mouth daily.     tetrahydrozoline-zinc (VISINE-AC) 0.05-0.25 % ophthalmic solution Place 1 drop into both eyes 3 (three) times daily as needed (dry eyes).     No current facility-administered medications for this visit.    OBJECTIVE:   Vitals:   01/13/24 1507 01/13/24 1508  BP: (!) 144/68 121/63  Pulse: 88   Resp: 18   Temp: 99.2 F (37.3 C)   SpO2: 93%         Body mass index is 18.65 kg/m.    ECOG FS: Filed Weights   01/13/24 1507  Weight: 105 lb 4.8 oz (47.8 kg)     PE deferred in lieu of counseling.    LAB RESULTS:  CMP      Component Value Date/Time   NA 140 02/24/2022 0544   NA 140 10/08/2016 1152   K 4.1 02/24/2022 0544   K 4.3 10/08/2016 1152   CL 105  02/24/2022 0544   CO2 26 02/24/2022 0544   CO2 25 10/08/2016 1152   GLUCOSE 105 (H) 02/24/2022 0544   GLUCOSE 98 10/08/2016 1152   BUN 11 02/24/2022 0544   BUN 12.4 10/08/2016 1152   CREATININE 0.87 02/24/2022 0544   CREATININE 0.81 12/12/2021 1511   CREATININE 0.8 10/08/2016 1152   CALCIUM 9.3 02/24/2022 0544   CALCIUM 9.7 10/08/2016 1152   PROT 6.9 12/12/2021 1511   PROT 7.1 10/08/2016 1152   ALBUMIN 4.3 12/12/2021 1511   ALBUMIN 4.1 10/08/2016 1152   AST 23 12/12/2021 1511   AST 23 10/08/2016 1152   ALT 20 12/12/2021 1511   ALT 18 10/08/2016 1152   ALKPHOS 61 12/12/2021 1511   ALKPHOS 75 10/08/2016 1152   BILITOT 0.3 12/12/2021 1511   BILITOT 0.42 10/08/2016 1152   GFRNONAA >60 02/24/2022 0544   GFRNONAA >60 12/12/2021 1511   GFRAA >60 01/09/2020 1503    INo results found for: SPEP, UPEP  Lab Results  Component Value Date   WBC 9.0 02/24/2022   NEUTROABS 5.5 12/12/2021   HGB 13.4 02/24/2022   HCT 40.6 02/24/2022   MCV 88.1 02/24/2022   PLT 280 02/24/2022      Chemistry      Component Value Date/Time   NA 140 02/24/2022 0544   NA 140 10/08/2016 1152   K 4.1 02/24/2022 0544   K 4.3 10/08/2016 1152   CL 105 02/24/2022 0544   CO2 26 02/24/2022 0544   CO2 25 10/08/2016 1152   BUN 11 02/24/2022 0544   BUN 12.4 10/08/2016 1152   CREATININE 0.87 02/24/2022 0544   CREATININE 0.81 12/12/2021 1511   CREATININE 0.8 10/08/2016 1152      Component Value Date/Time   CALCIUM 9.3 02/24/2022 0544   CALCIUM 9.7 10/08/2016 1152   ALKPHOS 61 12/12/2021 1511   ALKPHOS 75 10/08/2016 1152   AST 23 12/12/2021 1511   AST 23 10/08/2016 1152   ALT 20 12/12/2021 1511   ALT 18 10/08/2016 1152   BILITOT 0.3 12/12/2021 1511   BILITOT 0.42 10/08/2016 1152       No results found for: LABCA2  No components found for:  LABCA125  No results for input(s): INR in the last 168 hours.  Urinalysis No results found for: COLORURINE, APPEARANCEUR, LABSPEC, PHURINE, GLUCOSEU, HGBUR, BILIRUBINUR, KETONESUR, PROTEINUR, UROBILINOGEN, NITRITE, LEUKOCYTESUR   STUDIES: NM PET Image Restage (PS) Skull Base to Thigh (F-18 FDG) Result Date: 01/06/2024 CLINICAL DATA:  Subsequent treatment strategy for non-small cell lung cancer of right upper lobe status post right upper lobectomy, history of left breast cancer status post lumpectomy and radioactive seed placement EXAM: NUCLEAR MEDICINE PET SKULL BASE TO THIGH TECHNIQUE: 5.3 mCi F-18 FDG was injected intravenously. Full-ring PET imaging was performed from the skull base to thigh after the radiotracer. CT data was obtained and used for attenuation correction and anatomic localization. Fasting blood glucose: 118 mg/dl COMPARISON:  Chest CT January 10, 2024, PET-CT September 01, 2022 FINDINGS: Mediastinal blood pool activity: SUV max 1.9 Liver activity: SUV max 2.7 NECK: No suspicious lymphadenopathy. Incidental CT findings: None. CHEST: Right upper lobectomy. No suspicious metabolic activity at the surgical stump to suggest local recurrence. Paraseptal emphysematous changes of the lungs. Few new hypermetabolic nodules are identified as follows Left upper lobe subsolid nodule measuring 12 mm with max SUV 2.6 (image 56) Subpleural left lower lobe solid nodule measuring 15 mm with max SUV 3.3 (image 68). Left lower lobe ill-defined ground-glass nodule measuring  8 mm with max SUV 0.9 (image 83). Left lower lobe subpleural solid nodule measuring 11 mm masses being Left apex subpleural nodule max SUV 2.3 (image 48). Fiducial marker in left upper lobe. There is a new hypermetabolic nodule in right breast deep portions measuring 1 cm with max SUV 2, indeterminate, previously subcentimeter with max SUV 1.6. Left breast lumpectomy without suspicious finding to suggest locally  recurrent malignancy. Fiducial markers identified in left axilla. No suspicious lymphadenopathy. Incidental CT findings: Mild cardiomegaly. Trace pericardial fluid. Atherosclerotic calcifications of coronary arteries. ABDOMEN/PELVIS: No abnormal hypermetabolic activity within the liver, pancreas, adrenal glands, or spleen. No hypermetabolic lymph nodes in the abdomen or pelvis. Incidental CT findings: None. SKELETON: No focal hypermetabolic activity to suggest skeletal metastasis. Incidental CT findings: None. IMPRESSION: Right upper lobectomy without suspicious findings to suggest local recurrence at the surgical stump. Multiple new hypermetabolic contralateral left lung nodules concerning for metastatic disease until proven otherwise. Follow-up according to oncologic protocols. Status post left breast lumpectomy without abnormal finding in residual left breast. Enlarging right breast nodule with FDG uptake. Correlate with dedicated breast imaging to exclude malignancy. No lymphadenopathy or suspicious finding in the abdomen and pelvis to suggest distant metastatic disease. Electronically Signed   By: Megan  Zare M.D.   On: 01/06/2024 16:33     ASSESSMENT: 78 y.o.  [place] woman status post left breast lower outer quadrant biopsy 06/15/2015 for a clinical mT1c N0, stage IA  Invasive ductal carcinoma, grade 1, estrogen and progesterone receptor positive, HER-2 not amplified, with an MIB-1 of 11%  (1) status post left lumpectomy with sentinel lymph node sampling 07/17/2015 for a pT1b pN0, stage IA invasive ductal carcinoma, grade 2, with negative though close margins; repeat HER-2 was again negative, and estrogen and progesterone receptors were again positive  (2) Oncotype DX score of 16 predicts an outside the breast risk of recurrence within 10 years of 10% if the patient's only systemic therapy is tamoxifen for 5 years. It also her next no benefit from chemotherapy  (3)  The patient opted against  adjuvant radiation  (4)  Anastrozole  started 09/07/2015 discontinued May 2017 with intolerance  (a) exemestane  started June 2017, discontinued August 2017 with intolerance.  (b) [facility] density at the [facility] 11/05/2015 shows a T score of -2.0   (5) Factor V Leiden positive: elevated clotting risk with any procedure, immobilization, or procoagulant drugs such as estrogen  (6) s/p right upper lobectomy (after neoadjuvant chemo-radiation) August  2005 for a primary lung cancer: Non-small cell carcinoma, with a 4 cm area of tumor regression and necrosis and rare atypical cells but no obvious viable tumor remaining, and a total of 9 regional lymph nodes (N1, and 2) all negative.  (7) status post left breast upper outer quadrant 05/01/2020 shows invasive ductal carcinoma, low-grade, estrogen and progesterone receptor positive, HER-2 not amplified, with an MIB-1 of 25%  (8) status post left lumpectomy and sentinel lymph node sampling 05/28/2020 for a pT1b pN0, stage IA invasive ductal carcinoma, grade 2, with negative margins.  (a) a single left axillary lymph node removed  (b) status post chemoradiation for right upper lobectomy, making adjuvant radiation for her breast cancer problematic  (9) fulvestrant was to start 07/13/2019, but patient opted against  (a) patient is intolerant of aromatase inhibitors and tamoxifen is contraindicated (#5)   PLAN:   Assessment and Plan Assessment & Plan Non-small cell lung cancer New lung nodules may indicate progression or new metastasis.  PET scan showed new  small left lung nodules with low metabolic activity, suspicious for lung cancer. Biopsy not feasible due to size and pneumothorax risk from underlying lung condition. -  - Guardant 360 with no mutations that would enable us  to use targeted therapy. ATM alteration noted. We will see if the cytology sample from Sep 2023 can be used for foundation one and PDL1. - Monitor lung nodules with follow-up CT  scan in 3 months. - Discuss with Dr. Ruther regarding biopsy feasibility if nodules grow. - Consider immunotherapy if biopsy not feasible and nodules increase, weighing pneumonitis risk. - She is not a candidate for aggressive chemotherapy.  Right breast abnormality under evaluation PET scan showed right breast abnormality needing further evaluation to rule out malignancy. Not suspected to be breast cancer metastasizing to lung. - Order diagnostic mammogram and ultrasound of right breast. - Proceed with biopsy if ultrasound reveals suspicious lesion. - Discuss findings with Dr. Curvin when available.   Total time spent:40 minutes  *Total Encounter Time as defined by the Centers for Medicare and Medicaid Services includes, in addition to the face-to-face time of a patient visit (documented in the note above) non-face-to-face time: obtaining and reviewing outside history, ordering and reviewing medications, tests or procedures, care coordination (communications with other health care professionals or caregivers) and documentation in the medical record.

## 2024-01-14 ENCOUNTER — Telehealth: Payer: Self-pay | Admitting: Hematology and Oncology

## 2024-01-14 NOTE — Telephone Encounter (Signed)
 Spoke with patient confirming upcoming appointment

## 2024-01-15 ENCOUNTER — Other Ambulatory Visit: Payer: Self-pay | Admitting: *Deleted

## 2024-01-15 DIAGNOSIS — C50512 Malignant neoplasm of lower-outer quadrant of left female breast: Secondary | ICD-10-CM

## 2024-01-25 DIAGNOSIS — C3411 Malignant neoplasm of upper lobe, right bronchus or lung: Secondary | ICD-10-CM | POA: Diagnosis not present

## 2024-02-01 DIAGNOSIS — N6313 Unspecified lump in the right breast, lower outer quadrant: Secondary | ICD-10-CM | POA: Diagnosis not present

## 2024-02-01 DIAGNOSIS — R928 Other abnormal and inconclusive findings on diagnostic imaging of breast: Secondary | ICD-10-CM | POA: Diagnosis not present

## 2024-02-02 ENCOUNTER — Encounter (HOSPITAL_BASED_OUTPATIENT_CLINIC_OR_DEPARTMENT_OTHER): Payer: Self-pay | Admitting: Adult Health

## 2024-02-02 ENCOUNTER — Ambulatory Visit (HOSPITAL_BASED_OUTPATIENT_CLINIC_OR_DEPARTMENT_OTHER): Admitting: Adult Health

## 2024-02-02 VITALS — BP 178/83 | HR 86 | Ht 63.0 in | Wt 106.7 lb

## 2024-02-02 DIAGNOSIS — C3492 Malignant neoplasm of unspecified part of left bronchus or lung: Secondary | ICD-10-CM

## 2024-02-02 DIAGNOSIS — R918 Other nonspecific abnormal finding of lung field: Secondary | ICD-10-CM

## 2024-02-02 DIAGNOSIS — Z87891 Personal history of nicotine dependence: Secondary | ICD-10-CM | POA: Diagnosis not present

## 2024-02-02 NOTE — Progress Notes (Unsigned)
 @Patient  ID: Jessica Gilbert, female    DOB: [birth year], 78 y.o.   MRN: [id number]  Chief Complaint  Patient presents with   Follow-up    Pulmonary nodules    Referring provider: Dayna Motto, DO  HPI: 78 year old female former smoker followed for severe COPD and recurrent non-small cell lung cancer-right upper lobectomy 2005, lingular non-small cell lung cancer September 2023 status post SBRT, enlarged hypermetabolic right lower lobe status SBRT April 2024 History of recurrent breast cancer status postlumpectomy  TEST/EVENTS :  CT chest May 26, 2023 decreased 6 mm right middle lobe nodule, atypical pulmonary cyst left apical with increased medial wall thickening, stable left subpleural nodularity, minimally decreased small pericardial effusion, emphysema   CT chest August 12, 2022 small cavitary lingular lesion, new 12 x 10 mm left lower lobe lesion, enlarging right lower lobe nodule  PET scan September 01, 2022 hypermetabolic right middle lobe nodule, unchanged cavitary nodule in the left apex    02/02/2024 Follow up : COPD, recurrent lung cancer, CT /PET results  Discussed the use of AI scribe software for clinical note transcription with the patient, who gave verbal consent to proceed.  History of Present Illness Jessica Gilbert is a 78 year old female with COPD who presents for evaluation of lung nodules and recent imaging results.  She has a history of COPD / She uses Dulera  twice daily as her maintenance inhaler. She remains active, engaging in activities such as driving, shopping, and walking regularly. However, she experiences issues with her ProAir  inhaler, noting that it sometimes fails to dispense medication properly, leading to exacerbated breathing symptoms. She has previously tried Ventolin , which was ineffective.  We discussed and spacer to her inhaler.  She has a history of recurrent non-small cell lung cancer having undergone a right upper lobe ectomy in 2005.   She had a lingular non-small cell lung cancer September 2023 treated with SBRT.  In April 2024 she received SBRT for an enlarging hypermetabolic right lower lobe nodule.  Surveillance CT chest December 2024 showed a decreased right middle lobe nodule, atypical pulmonary cyst with increased medial wall thickening.  Stable subpleural nodularity. Follow-up CT scan in April 2025 showed new subpleural nodule measuring 2.6 x 1.4 cm with peripheral spiculation.  She was treated for possible infectious component with antibiotics.  And a repeat CT chest was done December 11, 2023 that showed multiple bilateral pulmonary nodules in the left lower lobe suggestive of metastatic disease.  Subpleural left base consolidation smaller, remainder of nodules grossly stable.  Subsequent PET scan January 04, 2024-multiple new hypermetabolic contralateral left lung nodules concerning for metastatic disease, enlarging right breast nodule with increased FDG uptake, no lymphadenopathy or suspicious findings in the abdomen or pelvis   She has a history of breast cancer in the left breast, with a recent ultrasound of the right breast revealing a suspicious area. A biopsy is scheduled for next Tuesday.   She has undergone radiation in the past for lung cancer and was informed that further radiation is not an option due to previous extensive treatment. She has not had recent chemotherapy but did undergo it in the past for right lung cancer. She has not experienced hemoptysis and is not on oxygen therapy. She occasionally uses a nebulizer, which provides some relief, and experiences shortness of breath.  Previous pulmonary function testing showed severe COPD with FEV1 at 32% in 2023.     Allergies  Allergen Reactions   Aspirin Other (See Comments)  burning stomach   Codeine Nausea Only   Vitamin D Analogs Other (See Comments)    Optical migraines     Immunization History  Administered Date(s) Administered   Fluad Quad(high  Dose 65+) 03/15/2016, 04/14/2023   Fluzone Influenza virus vaccine,trivalent (IIV3), split virus 04/11/2013, 03/15/2014   Influenza Split 03/16/2013   Influenza, High Dose Seasonal PF 04/22/2015, 03/24/2017, 04/02/2018, 04/05/2019, 04/03/2020, 03/23/2021   Influenza-Unspecified 03/15/2014   Moderna Sars-Covid-2 Vaccination 08/16/2019, 09/14/2019, 08/31/2020   Pfizer(Comirnaty)Fall Seasonal Vaccine 12 years and older 04/20/2023   Pneumococcal Polysaccharide-23 02/19/2012    Past Medical History:  Diagnosis Date   Acute respiratory failure (HCC)    Breast cancer (HCC)    left breast   Carotid stenosis, right    Celiac disease    Dyspnea    Emphysema lung (HCC)    Factor 5 Leiden mutation, heterozygous (HCC)    States no symptoms   History of hiatal hernia    History of radiation therapy    Left Lung-03/24/22-04/04/22- Dr. Lynwood Nasuti   Human metapneumovirus pneumonia 08/2013   Hyperlipidemia    Hypertension    Lung cancer (HCC) 2005   Rt upper lobe   Stroke (HCC)    TIA w/o defecits    Tobacco History: Social History   Tobacco Use  Smoking Status Former   Current packs/day: 0.00   Average packs/day: 1 pack/day for 35.0 years (35.0 ttl pk-yrs)   Types: Cigarettes   Start date: 01/28/1969   Quit date: 01/29/2004   Years since quitting: 20.0  Smokeless Tobacco Never   Counseling given: Not Answered   Outpatient Medications Prior to Visit  Medication Sig Dispense Refill   albuterol  (VENTOLIN  HFA) 108 (90 Base) MCG/ACT inhaler Inhale 1 puff into the lungs every 4 (four) hours as needed for shortness of breath.     diphenhydrAMINE (BENADRYL ALLERGY) 25 MG tablet Take 25 mg by mouth at bedtime as needed for allergies.     doxycycline  (VIBRA -TABS) 100 MG tablet Take 1 tablet (100 mg total) by mouth 2 (two) times daily. 14 tablet 0   famotidine (PEPCID AC) 10 MG tablet Take 10 mg by mouth daily.     fluticasone (FLONASE SENSIMIST) 27.5 MCG/SPRAY nasal spray Place 1 spray  into the nose daily.     guaiFENesin  (MUCINEX ) 600 MG 12 hr tablet Take 600 mg by mouth 2 (two) times daily as needed for to loosen phlegm.     ibuprofen (ADVIL,MOTRIN) 200 MG tablet Take 200 mg by mouth every 6 (six) hours as needed for moderate pain.     ipratropium (ATROVENT ) 0.03 % nasal spray Place 2 sprays into both nostrils 2 (two) times daily as needed for rhinitis. 30 mL 12   ipratropium-albuterol  (DUONEB) 0.5-2.5 (3) MG/3ML SOLN Take 3 mLs by nebulization every 6 (six) hours as needed. 120 mL 5   mometasone -formoterol  (DULERA ) 200-5 MCG/ACT AERO Inhale 2 puffs into the lungs 2 (two) times daily. 1 each 5   Multiple Vitamin (MULTIVITAMIN WITH MINERALS) TABS tablet Take 1 tablet by mouth daily.     Nutritional Supplements (HIGH-PROTEIN NUTRITIONAL SHAKE PO) Take by mouth. 1 time a day     telmisartan (MICARDIS) 40 MG tablet Take 40 mg by mouth daily.     tetrahydrozoline-zinc (VISINE-AC) 0.05-0.25 % ophthalmic solution Place 1 drop into both eyes 3 (three) times daily as needed (dry eyes).     No facility-administered medications prior to visit.     Review of Systems:   Constitutional:  No  weight loss, night sweats,  Fevers, chills, +fatigue, or  lassitude.  HEENT:   No headaches,  Difficulty swallowing,  Tooth/dental problems, or  Sore throat,                No sneezing, itching, ear ache, nasal congestion, post nasal drip,   CV:  No chest pain,  Orthopnea, PND, swelling in lower extremities, anasarca, dizziness, palpitations, syncope.   GI  No heartburn, indigestion, abdominal pain, nausea, vomiting, diarrhea, change in bowel habits, loss of appetite, bloody stools.   Resp:   No wheezing.  No chest wall deformity  Skin: no rash or lesions.  GU: no dysuria, change in color of urine, no urgency or frequency.  No flank pain, no hematuria   MS:  No joint pain or swelling.  No decreased range of motion.  No back pain.    Physical Exam  BP (!) 178/83   Pulse 86   Ht 5'  3 (1.6 m)   Wt 106 lb 11.2 oz (48.4 kg)   SpO2 93%   BMI 18.90 kg/m   GEN: A/Ox3; pleasant , NAD, thin, elderly   HEENT:  [place]/AT,  EACs-clear, TMs-wnl, NOSE-clear, THROAT-clear, no lesions, no postnasal drip or exudate noted.   NECK:  Supple w/ fair ROM; no JVD; normal carotid impulses w/o bruits; no thyromegaly or nodules palpated; no lymphadenopathy.    RESP  Clear  P & A; w/o, wheezes/ rales/ or rhonchi. no accessory muscle use, no dullness to percussion  CARD:  RRR, no m/r/g, no peripheral edema, pulses intact, no cyanosis or clubbing.  GI:   Soft & nt; nml bowel sounds; no organomegaly or masses detected.   Musco: Warm bil, no deformities or joint swelling noted.   Neuro: alert, no focal deficits noted.    Skin: Warm, no lesions or rashes    Lab Results:  CBC     BNP No results found for: BNP  ProBNP    Component Value Date/Time   PROBNP 118.5 08/28/2013 0937    Imaging: NM PET Image Restage (PS) Skull Base to Thigh (F-18 FDG) Result Date: 01/06/2024 CLINICAL DATA:  Subsequent treatment strategy for non-small cell lung cancer of right upper lobe status post right upper lobectomy, history of left breast cancer status post lumpectomy and radioactive seed placement EXAM: NUCLEAR MEDICINE PET SKULL BASE TO THIGH TECHNIQUE: 5.3 mCi F-18 FDG was injected intravenously. Full-ring PET imaging was performed from the skull base to thigh after the radiotracer. CT data was obtained and used for attenuation correction and anatomic localization. Fasting blood glucose: 118 mg/dl COMPARISON:  Chest CT January 10, 2024, PET-CT September 01, 2022 FINDINGS: Mediastinal blood pool activity: SUV max 1.9 Liver activity: SUV max 2.7 NECK: No suspicious lymphadenopathy. Incidental CT findings: None. CHEST: Right upper lobectomy. No suspicious metabolic activity at the surgical stump to suggest local recurrence. Paraseptal emphysematous changes of the lungs. Few new hypermetabolic nodules are  identified as follows Left upper lobe subsolid nodule measuring 12 mm with max SUV 2.6 (image 56) Subpleural left lower lobe solid nodule measuring 15 mm with max SUV 3.3 (image 68). Left lower lobe ill-defined ground-glass nodule measuring 8 mm with max SUV 0.9 (image 83). Left lower lobe subpleural solid nodule measuring 11 mm masses being Left apex subpleural nodule max SUV 2.3 (image 48). Fiducial marker in left upper lobe. There is a new hypermetabolic nodule in right breast deep portions measuring 1 cm with max SUV 2, indeterminate, previously subcentimeter  with max SUV 1.6. Left breast lumpectomy without suspicious finding to suggest locally recurrent malignancy. Fiducial markers identified in left axilla. No suspicious lymphadenopathy. Incidental CT findings: Mild cardiomegaly. Trace pericardial fluid. Atherosclerotic calcifications of coronary arteries. ABDOMEN/PELVIS: No abnormal hypermetabolic activity within the liver, pancreas, adrenal glands, or spleen. No hypermetabolic lymph nodes in the abdomen or pelvis. Incidental CT findings: None. SKELETON: No focal hypermetabolic activity to suggest skeletal metastasis. Incidental CT findings: None. IMPRESSION: Right upper lobectomy without suspicious findings to suggest local recurrence at the surgical stump. Multiple new hypermetabolic contralateral left lung nodules concerning for metastatic disease until proven otherwise. Follow-up according to oncologic protocols. Status post left breast lumpectomy without abnormal finding in residual left breast. Enlarging right breast nodule with FDG uptake. Correlate with dedicated breast imaging to exclude malignancy. No lymphadenopathy or suspicious finding in the abdomen and pelvis to suggest distant metastatic disease. Electronically Signed   By: Megan  Zare M.D.   On: 01/06/2024 16:33    Administration History     None          Latest Ref Rng & Units 08/16/2021   10:49 AM 02/16/2014    9:52 AM  PFT  Results  FVC-Pre L 1.91  2.16   FVC-Predicted Pre % 70  72   FVC-Post L 1.98  2.27   FVC-Predicted Post % 72  76   Pre FEV1/FVC % % 32  35   Post FEV1/FCV % % 34  36   FEV1-Pre L 0.61  0.76   FEV1-Predicted Pre % 29  33   FEV1-Post L 0.67  0.82   DLCO uncorrected ml/min/mmHg 8.40  12.06   DLCO UNC% % 45  52   DLCO corrected ml/min/mmHg 8.40    DLCO COR %Predicted % 45    DLVA Predicted % 54  57   TLC L 5.77  6.03   TLC % Predicted % 117  123   RV % Predicted % 161  159     No results found for: NITRICOXIDE      Assessment & Plan:   No problem-specific Assessment & Plan notes found for this encounter.  Assessment and Plan Assessment & Plan Recurrent Non-small cell lung cancer -with previous right upper lobectomy in 2005, lingular non-small cell treated with SBRT in September 2023 and enlarging hypermetabolic right lower lobe nodule treated in April 2024.  Recent CT chest and PET scan are concerning for possible recurrence.  Patient is high risk for undergoing tissue biopsy as she had severe COPD with emphysema with FEV1 around 32% and undergo multiple radiation treatments in the past which increase her risk for lung collapse.  Case was discussed in detail with Dr. Shelah .  Lung nodule mildly could be a component of inflammatory process.-Case discussed in detail with patient for now hold off on tissue biopsy.  Not a candidate for additional radiation therapy.  Without tissue sample would not be a candidate for chemotherapy at this time.  Will repeat CT chest in 2 months and decide on possibility for bronchoscopy with tissue sampling   Chronic obstructive pulmonary disease with emphysema   COPD and emphysema are well-managed. She continues using Dulera  as a maintenance inhaler. There are issues with the ProAir  inhaler not delivering medication consistently, raising concerns about its effectiveness. Continue Dulera  inhaler twice daily. Order a spacer for use with the ProAir  inhaler  to ensure proper medication delivery. Consider switching from ProAir  to Ventolin  if issues persist, despite insurance preferences.  Suspicious right breast  mass, pending biopsy   A suspicious area in the right breast was identified on PET scan and ultrasound, and a biopsy is scheduled for next Tuesday. Proceed with the scheduled biopsy of the right breast mass.  Allergic rhinitis-continue on Claritin and Flonase as needed.  Protein calorie malnutrition-encouraged on a high-protein diet  Plan  Patient Instructions  Set up CT chest in 2 months  Saline nasal spray Twice daily   Saline nasal gel At bedtime   Flonase nasal daily  Claritin 10mg  At bedtime As needed  drainage  Ipratropium  nasal spray As needed  drippy nose  Continue on Dulera  2 puffs Twice daily, rinse after use.  Albuterol  inhaler or  Duoneb neb As needed   Can use Spacer with inhaler.  Activity as tolerated High protein diet /ensure daily  Follow up in with Dr. Shelah  in 3 months and As needed            I spent 43    minutes dedicated to the care of this patient on the date of this encounter to include pre-visit review of records, face-to-face time with the patient discussing conditions above, post visit ordering of testing, clinical documentation with the electronic health record, making appropriate referrals as documented, and communicating necessary findings to members of the patients care team.     Madelin Stank, NP 02/02/2024

## 2024-02-02 NOTE — Patient Instructions (Addendum)
 Set up CT chest in 2 months  Saline nasal spray Twice daily   Saline nasal gel At bedtime   Flonase nasal daily  Claritin 10mg  At bedtime As needed  drainage  Ipratropium  nasal spray As needed  drippy nose  Continue on Dulera  2 puffs Twice daily, rinse after use.  Albuterol  inhaler or  Duoneb neb As needed   Can use Spacer with inhaler.  Activity as tolerated High protein diet /ensure daily  Follow up in with Dr. Shelah  in 3 months and As needed

## 2024-02-05 NOTE — Progress Notes (Signed)
 I agree with the plans as outlined above.   Lamar Chris, MD, PhD 02/05/2024, 6:30 PM  Pulmonary and Critical Care [phone] or if no answer before 7:00PM call [phone] For any issues after 7:00PM please call eLink [phone]

## 2024-02-09 ENCOUNTER — Other Ambulatory Visit: Payer: Self-pay | Admitting: Radiology

## 2024-02-09 DIAGNOSIS — C50511 Malignant neoplasm of lower-outer quadrant of right female breast: Secondary | ICD-10-CM | POA: Diagnosis not present

## 2024-02-09 DIAGNOSIS — N6313 Unspecified lump in the right breast, lower outer quadrant: Secondary | ICD-10-CM | POA: Diagnosis not present

## 2024-02-10 LAB — SURGICAL PATHOLOGY

## 2024-02-11 ENCOUNTER — Encounter: Payer: Self-pay | Admitting: Hematology and Oncology

## 2024-02-12 DIAGNOSIS — C4441 Basal cell carcinoma of skin of scalp and neck: Secondary | ICD-10-CM | POA: Diagnosis not present

## 2024-02-16 ENCOUNTER — Encounter: Payer: Self-pay | Admitting: Radiology

## 2024-02-18 DIAGNOSIS — Z170 Estrogen receptor positive status [ER+]: Secondary | ICD-10-CM | POA: Diagnosis not present

## 2024-02-18 DIAGNOSIS — C50511 Malignant neoplasm of lower-outer quadrant of right female breast: Secondary | ICD-10-CM | POA: Diagnosis not present

## 2024-02-19 ENCOUNTER — Telehealth: Payer: Self-pay | Admitting: *Deleted

## 2024-02-19 NOTE — Telephone Encounter (Signed)
 Called Jessica Gilbert to schedule appt with Dr. Loretha for f/u results from [facility]. Jessica Gilbert verbalized understanding and aware of appt for 9/17 @ 3pm

## 2024-02-25 ENCOUNTER — Encounter: Payer: Self-pay | Admitting: Hematology and Oncology

## 2024-03-02 ENCOUNTER — Inpatient Hospital Stay: Attending: Hematology and Oncology | Admitting: Hematology and Oncology

## 2024-03-02 VITALS — BP 152/74 | HR 92 | Temp 98.7°F | Resp 19 | Wt 106.9 lb

## 2024-03-02 DIAGNOSIS — Z1721 Progesterone receptor positive status: Secondary | ICD-10-CM | POA: Diagnosis not present

## 2024-03-02 DIAGNOSIS — Z923 Personal history of irradiation: Secondary | ICD-10-CM | POA: Insufficient documentation

## 2024-03-02 DIAGNOSIS — C50412 Malignant neoplasm of upper-outer quadrant of left female breast: Secondary | ICD-10-CM | POA: Insufficient documentation

## 2024-03-02 DIAGNOSIS — Z170 Estrogen receptor positive status [ER+]: Secondary | ICD-10-CM | POA: Diagnosis not present

## 2024-03-02 DIAGNOSIS — Z1732 Human epidermal growth factor receptor 2 negative status: Secondary | ICD-10-CM | POA: Insufficient documentation

## 2024-03-02 DIAGNOSIS — Z87891 Personal history of nicotine dependence: Secondary | ICD-10-CM | POA: Diagnosis not present

## 2024-03-02 DIAGNOSIS — Z803 Family history of malignant neoplasm of breast: Secondary | ICD-10-CM | POA: Insufficient documentation

## 2024-03-02 DIAGNOSIS — C50512 Malignant neoplasm of lower-outer quadrant of left female breast: Secondary | ICD-10-CM

## 2024-03-02 DIAGNOSIS — Z800 Family history of malignant neoplasm of digestive organs: Secondary | ICD-10-CM | POA: Diagnosis not present

## 2024-03-02 DIAGNOSIS — Z85118 Personal history of other malignant neoplasm of bronchus and lung: Secondary | ICD-10-CM | POA: Diagnosis not present

## 2024-03-02 DIAGNOSIS — R918 Other nonspecific abnormal finding of lung field: Secondary | ICD-10-CM | POA: Diagnosis not present

## 2024-03-02 DIAGNOSIS — Z801 Family history of malignant neoplasm of trachea, bronchus and lung: Secondary | ICD-10-CM | POA: Diagnosis not present

## 2024-03-02 DIAGNOSIS — Z9221 Personal history of antineoplastic chemotherapy: Secondary | ICD-10-CM | POA: Insufficient documentation

## 2024-03-02 DIAGNOSIS — D6851 Activated protein C resistance: Secondary | ICD-10-CM | POA: Insufficient documentation

## 2024-03-02 DIAGNOSIS — Z806 Family history of leukemia: Secondary | ICD-10-CM | POA: Insufficient documentation

## 2024-03-02 MED ORDER — LETROZOLE 2.5 MG PO TABS: 2.5000 mg | ORAL_TABLET | Freq: Every day | ORAL | 1 refills | Status: DC

## 2024-03-02 NOTE — Progress Notes (Signed)
 [facility]  [phone] [phone]     ID: Jessica Gilbert DOB: 1945-12-27  MR#: [id number]  RDW#:[id number]  Patient Care Team: Jessica Motto, DO as PCP - General (Family Medicine) Jessica Domino, MD as Attending Physician (Radiation Oncology) Jessica Deward MOULD, MD as Consulting Physician (General Surgery) Jessica Nanetta SAILOR, RN as Oncology Nurse Navigator  CHIEF COMPLAINT:  Estrogen receptor positive breast cancer  CURRENT TREATMENT: Observation  Discussed the use of AI scribe software for clinical note transcription with the patient, who gave verbal consent to proceed.  History of Present Illness  Jessica Gilbert is a 78 year old female with breast cancer and non-small cell lung cancer who presents for discussion of her pathology results and treatment options.  She has been diagnosed with invasive ductal breast cancer, which is strongly estrogen and progesterone receptor positive, and HER2 positive. The growth rate is intermediate. The HER2 positivity was a new finding since the last tumor board discussion.  She has a history of non-small cell lung cancer with multiple lung nodules. A PET scan in July showed multiple lung nodules. She is scheduled for a CT scan on October 8th to monitor the lung nodules.  Her past medical history includes a factor V Leiden mutation, which has influenced her treatment options, particularly concerning the use of tamoxifen due to the risk of blood clots. She has previously tried anastrozole  and exemestane  but experienced severe side effects. She has not tried letrozole  yet.  She is currently experiencing significant stress and anxiety related to her medical conditions, describing it as 'just having a nervous breakdown'. She is trying to manage her conditions one at a time, focusing on the breast cancer treatment first.    Rest of the pertinent 10 point ROS reviewed and negative   COVID 19 VACCINATION STATUS: Status  post Moderna x4,   BREAST CANCER HISTORY: From the original intake note:   Jessica Gilbert (pronounced ro-lund) had bilateral screening mammography at the breast Center 05/26/2014 showing a possible mass in the left breast. Left digital mammography with ultrasonography 06/14/2014 found a breast density to be category C. There was no persistent mass or distortion or malignant-type microcalcifications. . There was no palpable mass. Ultrasound showed a well circumscribed benign appearing nodule in the left breast 2:00 position measuring 4 mm. This was felt to be most likely benign but repeat ultrasound  At 6 months was recommended and thiswas performed 12/14/2014.  The mass was unchanged an incidental note was made of a benign appearing 0.5 cm lymph node in the left breast 2:00 position.   On 06/15/2015 Jessica had bilateral diagnostic mammography with tomosynthesis and this time an irregular mass with spiculated margins was noted in the outer lower left breast measuring approximately 9 mm. Physical examination confirmed a firm fixed nodule at the 3:30 o'clock position an ultrasound of this area confirmed an irregular hypoechoic mass measuring 0.7 cm. There was a 0.2 cm adjacent satellite nodule. There was no left axillary lymphadenopathy.   biopsy of this mass was obtained 06/15/2015, and showed ([id number]) an invasive ductal carcinoma, grade 1, estrogen receptor 100% positive, progesterone receptor 40% positive, both with strong staining intensity, with an MIB-1 of 11%, and no HER-2 amplification, the signals ratio being 1.39 and the number per cell 2.15.   Jessica Gilbert also has a history of prior tobacco abuse, status post right upper lobectomy for lung cancer, with significant emphysema.   The patient's subsequent history is as detailed below  PAST MEDICAL HISTORY: Past Medical History:  Diagnosis Date   Acute respiratory failure (HCC)    Breast cancer (HCC)    left breast   Carotid stenosis,  right    Celiac disease    Dyspnea    Emphysema lung (HCC)    Factor 5 Leiden mutation, heterozygous (HCC)    States no symptoms   History of hiatal hernia    History of radiation therapy    Left Lung-03/24/22-04/04/22- Dr. Lynwood Gilbert   Human metapneumovirus pneumonia 08/2013   Hyperlipidemia    Hypertension    Lung cancer (HCC) 2005   Rt upper lobe   Stroke (HCC)    TIA w/o defecits    PAST SURGICAL HISTORY: Past Surgical History:  Procedure Laterality Date   BREAST LUMPECTOMY     left    BREAST LUMPECTOMY WITH RADIOACTIVE SEED AND SENTINEL LYMPH NODE BIOPSY Left 07/17/2015   Procedure: LEFT BREAST LUMPECTOMY WITH RADIOACTIVE SEED WITH LEFT AXILLARY SENTINEL LYMPH NODE BIOPSY;  Surgeon: Jessica Angle, MD;  Location: [facility];  Service: General;  Laterality: Left;   BREAST LUMPECTOMY WITH RADIOACTIVE SEED AND SENTINEL LYMPH NODE BIOPSY Left 05/28/2020   Procedure: LEFT BREAST LUMPECTOMY WITH RADIOACTIVE SEED AND SENTINEL LYMPH NODE BIOPSY;  Surgeon: Jessica Deward MOULD, MD;  Location: [facility];  Service: General;  Laterality: Left;   BRONCHIAL BIOPSY  02/24/2022   Procedure: BRONCHIAL BIOPSIES;  Surgeon: Jessica Lamar RAMAN, MD;  Location: [facility] ENDOSCOPY;  Service: Pulmonary;;   BRONCHIAL BRUSHINGS  02/24/2022   Procedure: BRONCHIAL BRUSHINGS;  Surgeon: Jessica Lamar RAMAN, MD;  Location: [facility] ENDOSCOPY;  Service: Pulmonary;;   BRONCHIAL NEEDLE ASPIRATION BIOPSY  02/24/2022   Procedure: BRONCHIAL NEEDLE ASPIRATION BIOPSIES;  Surgeon: Jessica Lamar RAMAN, MD;  Location: [facility] ENDOSCOPY;  Service: Pulmonary;;   BRONCHIAL WASHINGS  02/24/2022   Procedure: BRONCHIAL WASHINGS;  Surgeon: Jessica Lamar RAMAN, MD;  Location: [facility] ENDOSCOPY;  Service: Pulmonary;;   FIDUCIAL MARKER PLACEMENT  02/24/2022   Procedure: FIDUCIAL MARKER PLACEMENT;  Surgeon: Jessica Lamar RAMAN, MD;  Location: [facility] ENDOSCOPY;  Service: Pulmonary;;   LUNG REMOVAL, PARTIAL Right 2005   Upper lobe   TRIGGER FINGER  RELEASE     TUBAL LIGATION  1980   VIDEO BRONCHOSCOPY WITH RADIAL ENDOBRONCHIAL ULTRASOUND  02/24/2022   Procedure: VIDEO BRONCHOSCOPY WITH RADIAL ENDOBRONCHIAL ULTRASOUND;  Surgeon: Jessica Lamar RAMAN, MD;  Location: [facility] ENDOSCOPY;  Service: Pulmonary;;    FAMILY HISTORY Family History  Problem Relation Age of Onset   Heart disease Father    High blood pressure Father    Congestive Heart Failure Father    Leukemia Mother    Colon polyps Mother    High blood pressure Brother    Lung cancer Brother    Heart disease Brother    Diabetes Brother    Colon polyps Sister    Breast cancer Sister    High blood pressure Sister    Emphysema Sister    Pancreatic cancer Paternal Uncle    the patient's father died at the age of 61 with congestive 40 failure. Patient's mother died with acute leukemia at the age of 61. The patient had one brother, 2 sisters. One sister had breast cancer at age 92. One brother, smoker, was diagnosed with lung cancer at the age of 35. There is a paternal uncle who may have had pancreatic cancer. There is no history of ovarian cancer in the family.   GYNECOLOGIC HISTORY:  No LMP recorded. Patient  is postmenopausal.  menarche age 28, first live birth age 40. The patient is GX P2. She stopped having periods in 1998, took hormone replacement approximately 6 months. Remotely she took oral contraceptives about 3 years with no complications   SOCIAL HISTORY: (updated November 2022)  Jessica worked as an Airline pilot.  She describes herself is single. At home she lives by herself, with 3 cats. Her son Shaindel Sweeten lives in [place] and was recently promoted to Archivist in Patent examiner. The patient's other son died 13 years ago. Katesha has 2 grandchildren, a Engineer, maintenance (IT) who graduated from [employer] and now is working towards a PhD and a grandson who works for [employer] in [place].. She is a International aid/development worker.    ADVANCED DIRECTIVES:  In place. Her son Garrel is her healthcare power of  attorney. He can be reached at [phone]   HEALTH MAINTENANCE: Social History   Tobacco Use   Smoking status: Former    Current packs/day: 0.00    Average packs/day: 1 pack/day for 35.0 years (35.0 ttl pk-yrs)    Types: Cigarettes    Start date: 01/28/1969    Quit date: 01/29/2004    Years since quitting: 20.1   Smokeless tobacco: Never  Vaping Use   Vaping status: Never Used  Substance Use Topics   Alcohol use: Not Currently   Drug use: No     Colonoscopy:  PAP:  Bone density: remote.   Lipid panel:  Allergies  Allergen Reactions   Aspirin Other (See Comments)    burning stomach   Codeine Nausea Only   Vitamin D Analogs Other (See Comments)    Optical migraines     Current Outpatient Medications  Medication Sig Dispense Refill   albuterol  (VENTOLIN  HFA) 108 (90 Base) MCG/ACT inhaler Inhale 1 puff into the lungs every 4 (four) hours as needed for shortness of breath.     diphenhydrAMINE (BENADRYL ALLERGY) 25 MG tablet Take 25 mg by mouth at bedtime as needed for allergies.     famotidine (PEPCID AC) 10 MG tablet Take 10 mg by mouth daily.     fluticasone (FLONASE SENSIMIST) 27.5 MCG/SPRAY nasal spray Place 1 spray into the nose daily.     guaiFENesin  (MUCINEX ) 600 MG 12 hr tablet Take 600 mg by mouth 2 (two) times daily as needed for to loosen phlegm.     ibuprofen (ADVIL,MOTRIN) 200 MG tablet Take 200 mg by mouth every 6 (six) hours as needed for moderate pain.     ipratropium (ATROVENT ) 0.03 % nasal spray Place 2 sprays into both nostrils 2 (two) times daily as needed for rhinitis. 30 mL 12   ipratropium-albuterol  (DUONEB) 0.5-2.5 (3) MG/3ML SOLN Take 3 mLs by nebulization every 6 (six) hours as needed. 120 mL 5   mometasone -formoterol  (DULERA ) 200-5 MCG/ACT AERO Inhale 2 puffs into the lungs 2 (two) times daily. 1 each 5   Multiple Vitamin (MULTIVITAMIN WITH MINERALS) TABS tablet Take 1 tablet by mouth daily.     Nutritional Supplements (HIGH-PROTEIN NUTRITIONAL  SHAKE PO) Take by mouth. 1 time a day     telmisartan (MICARDIS) 40 MG tablet Take 40 mg by mouth daily.     tetrahydrozoline-zinc (VISINE-AC) 0.05-0.25 % ophthalmic solution Place 1 drop into both eyes 3 (three) times daily as needed (dry eyes).     doxycycline  (VIBRA -TABS) 100 MG tablet Take 1 tablet (100 mg total) by mouth 2 (two) times daily. 14 tablet 0   No current facility-administered medications for this visit.  OBJECTIVE:   Vitals:   03/02/24 1502  BP: (!) 152/74  Pulse: 92  Resp: 19  Temp: 98.7 F (37.1 C)  SpO2: 91%        Body mass index is 18.94 kg/m.    ECOG FS: Filed Weights   03/02/24 1502  Weight: 106 lb 14.4 oz (48.5 kg)     PE deferred in lieu of counseling.    LAB RESULTS:  CMP     Component Value Date/Time   NA 140 02/24/2022 0544   NA 140 10/08/2016 1152   K 4.1 02/24/2022 0544   K 4.3 10/08/2016 1152   CL 105 02/24/2022 0544   CO2 26 02/24/2022 0544   CO2 25 10/08/2016 1152   GLUCOSE 105 (H) 02/24/2022 0544   GLUCOSE 98 10/08/2016 1152   BUN 11 02/24/2022 0544   BUN 12.4 10/08/2016 1152   CREATININE 0.87 02/24/2022 0544   CREATININE 0.81 12/12/2021 1511   CREATININE 0.8 10/08/2016 1152   CALCIUM 9.3 02/24/2022 0544   CALCIUM 9.7 10/08/2016 1152   PROT 6.9 12/12/2021 1511   PROT 7.1 10/08/2016 1152   ALBUMIN 4.3 12/12/2021 1511   ALBUMIN 4.1 10/08/2016 1152   AST 23 12/12/2021 1511   AST 23 10/08/2016 1152   ALT 20 12/12/2021 1511   ALT 18 10/08/2016 1152   ALKPHOS 61 12/12/2021 1511   ALKPHOS 75 10/08/2016 1152   BILITOT 0.3 12/12/2021 1511   BILITOT 0.42 10/08/2016 1152   GFRNONAA >60 02/24/2022 0544   GFRNONAA >60 12/12/2021 1511   GFRAA >60 01/09/2020 1503    INo results found for: SPEP, UPEP  Lab Results  Component Value Date   WBC 9.0 02/24/2022   NEUTROABS 5.5 12/12/2021   HGB 13.4 02/24/2022   HCT 40.6 02/24/2022   MCV 88.1 02/24/2022   PLT 280 02/24/2022      Chemistry      Component Value  Date/Time   NA 140 02/24/2022 0544   NA 140 10/08/2016 1152   K 4.1 02/24/2022 0544   K 4.3 10/08/2016 1152   CL 105 02/24/2022 0544   CO2 26 02/24/2022 0544   CO2 25 10/08/2016 1152   BUN 11 02/24/2022 0544   BUN 12.4 10/08/2016 1152   CREATININE 0.87 02/24/2022 0544   CREATININE 0.81 12/12/2021 1511   CREATININE 0.8 10/08/2016 1152      Component Value Date/Time   CALCIUM 9.3 02/24/2022 0544   CALCIUM 9.7 10/08/2016 1152   ALKPHOS 61 12/12/2021 1511   ALKPHOS 75 10/08/2016 1152   AST 23 12/12/2021 1511   AST 23 10/08/2016 1152   ALT 20 12/12/2021 1511   ALT 18 10/08/2016 1152   BILITOT 0.3 12/12/2021 1511   BILITOT 0.42 10/08/2016 1152       No results found for: LABCA2  No components found for: LABCA125  No results for input(s): INR in the last 168 hours.  Urinalysis No results found for: COLORURINE, APPEARANCEUR, LABSPEC, PHURINE, GLUCOSEU, HGBUR, BILIRUBINUR, KETONESUR, PROTEINUR, UROBILINOGEN, NITRITE, LEUKOCYTESUR   STUDIES: No results found.    ASSESSMENT: 78 y.o.  [place] woman status post left breast lower outer quadrant biopsy 06/15/2015 for a clinical mT1c N0, stage IA  Invasive ductal carcinoma, grade 1, estrogen and progesterone receptor positive, HER-2 not amplified, with an MIB-1 of 11%  (1) status post left lumpectomy with sentinel lymph node sampling 07/17/2015 for a pT1b pN0, stage IA invasive ductal carcinoma, grade 2, with negative though close margins; repeat HER-2 was again negative,  and estrogen and progesterone receptors were again positive  (2) Oncotype DX score of 16 predicts an outside the breast risk of recurrence within 10 years of 10% if the patient's only systemic therapy is tamoxifen for 5 years. It also her next no benefit from chemotherapy  (3)  The patient opted against adjuvant radiation  (4)  Anastrozole  started 09/07/2015 discontinued May 2017 with intolerance  (a) exemestane  started June  2017, discontinued August 2017 with intolerance.  (b) bone density at the [facility] 11/05/2015 shows a T score of -2.0   (5) Factor V Leiden positive: elevated clotting risk with any procedure, immobilization, or procoagulant drugs such as estrogen  (6) s/p right upper lobectomy (after neoadjuvant chemo-radiation) August  2005 for a primary lung cancer: Non-small cell carcinoma, with a 4 cm area of tumor regression and necrosis and rare atypical cells but no obvious viable tumor remaining, and a total of 9 regional lymph nodes (N1, and 2) all negative.  (7) status post left breast upper outer quadrant 05/01/2020 shows invasive ductal carcinoma, low-grade, estrogen and progesterone receptor positive, HER-2 not amplified, with an MIB-1 of 25%  (8) status post left lumpectomy and sentinel lymph node sampling 05/28/2020 for a pT1b pN0, stage IA invasive ductal carcinoma, grade 2, with negative margins.  (a) a single left axillary lymph node removed  (b) status post chemoradiation for right upper lobectomy, making adjuvant radiation for her breast cancer problematic  (9) fulvestrant was to start 07/13/2019, but patient opted against  (a) patient is intolerant of aromatase inhibitors and tamoxifen is contraindicated (#5)   PLAN:   Assessment and Plan Assessment & Plan Non-small cell lung cancer New lung nodules may indicate progression or new metastasis.  PET scan showed new small left lung nodules with low metabolic activity, suspicious for lung cancer. Biopsy not feasible due to size and pneumothorax risk from underlying lung condition. -  - Guardant 360 with no mutations that would enable us  to use targeted therapy.  Foundation one, tumor purity not good, no actionable mutation Proceed with imaging as scheduled.  HER2-positive invasive ductal carcinoma of breast HER2-positive, ER/PR-positive invasive ductal carcinoma with intermediate growth.  Dr Jessica working with Dr Jessica for  clearance to proceed with surgery. - Administer Herceptin (trastuzumab) in the adj setting - Prescribe letrozole . She has tried anastrozole  and exemestane , didn't tolerate it well. She has history of factor V leiden mutation, hence tamoxifen is not recommended. - Monitor cardiac function with echocardiogram. Ordered.    Total time spent:30 minutes  *Total Encounter Time as defined by the Centers for Medicare and Medicaid Services includes, in addition to the face-to-face time of a patient visit (documented in the note above) non-face-to-face time: obtaining and reviewing outside history, ordering and reviewing medications, tests or procedures, care coordination (communications with other health care professionals or caregivers) and documentation in the medical record.

## 2024-03-08 ENCOUNTER — Encounter: Payer: Self-pay | Admitting: Hematology and Oncology

## 2024-03-08 ENCOUNTER — Ambulatory Visit (HOSPITAL_COMMUNITY)
Admission: RE | Admit: 2024-03-08 | Discharge: 2024-03-08 | Disposition: A | Discharge status: Home / Self Care | Source: Ambulatory Visit | Attending: Hematology and Oncology | Admitting: Hematology and Oncology

## 2024-03-08 DIAGNOSIS — Z08 Encounter for follow-up examination after completed treatment for malignant neoplasm: Secondary | ICD-10-CM | POA: Diagnosis not present

## 2024-03-08 DIAGNOSIS — C50512 Malignant neoplasm of lower-outer quadrant of left female breast: Secondary | ICD-10-CM

## 2024-03-08 DIAGNOSIS — I10 Essential (primary) hypertension: Secondary | ICD-10-CM | POA: Diagnosis not present

## 2024-03-08 DIAGNOSIS — E785 Hyperlipidemia, unspecified: Secondary | ICD-10-CM | POA: Diagnosis not present

## 2024-03-08 DIAGNOSIS — Z85118 Personal history of other malignant neoplasm of bronchus and lung: Secondary | ICD-10-CM | POA: Diagnosis not present

## 2024-03-08 DIAGNOSIS — R0600 Dyspnea, unspecified: Secondary | ICD-10-CM | POA: Insufficient documentation

## 2024-03-08 DIAGNOSIS — Z87891 Personal history of nicotine dependence: Secondary | ICD-10-CM | POA: Diagnosis not present

## 2024-03-08 DIAGNOSIS — I340 Nonrheumatic mitral (valve) insufficiency: Secondary | ICD-10-CM | POA: Diagnosis not present

## 2024-03-08 DIAGNOSIS — I3139 Other pericardial effusion (noninflammatory): Secondary | ICD-10-CM | POA: Diagnosis not present

## 2024-03-08 DIAGNOSIS — Z170 Estrogen receptor positive status [ER+]: Secondary | ICD-10-CM | POA: Diagnosis not present

## 2024-03-08 DIAGNOSIS — Z853 Personal history of malignant neoplasm of breast: Secondary | ICD-10-CM | POA: Diagnosis not present

## 2024-03-08 DIAGNOSIS — Z0189 Encounter for other specified special examinations: Secondary | ICD-10-CM

## 2024-03-08 LAB — ECHOCARDIOGRAM COMPLETE
AR max vel: 2.3 cm2
AV Area VTI: 1.95 cm2
AV Area mean vel: 2.18 cm2
AV Mean grad: 2 mmHg
AV Peak grad: 4.4 mmHg
Ao pk vel: 1.05 m/s
Area-P 1/2: 4.29 cm2
Calc EF: 60.9 %
MV M vel: 5.06 m/s
MV Peak grad: 102.4 mmHg
MV VTI: 2.32 cm2
S' Lateral: 2.8 cm
Single Plane A2C EF: 58 %
Single Plane A4C EF: 61.8 %

## 2024-03-15 ENCOUNTER — Ambulatory Visit: Payer: Self-pay | Admitting: General Surgery

## 2024-03-15 DIAGNOSIS — C50511 Malignant neoplasm of lower-outer quadrant of right female breast: Secondary | ICD-10-CM

## 2024-03-23 ENCOUNTER — Ambulatory Visit
Admission: RE | Admit: 2024-03-23 | Discharge: 2024-03-23 | Disposition: A | Source: Ambulatory Visit | Attending: Adult Health | Admitting: Adult Health

## 2024-03-23 DIAGNOSIS — J984 Other disorders of lung: Secondary | ICD-10-CM | POA: Diagnosis not present

## 2024-03-23 DIAGNOSIS — C3492 Malignant neoplasm of unspecified part of left bronchus or lung: Secondary | ICD-10-CM

## 2024-03-23 DIAGNOSIS — J439 Emphysema, unspecified: Secondary | ICD-10-CM | POA: Diagnosis not present

## 2024-03-23 DIAGNOSIS — R918 Other nonspecific abnormal finding of lung field: Secondary | ICD-10-CM

## 2024-03-25 ENCOUNTER — Other Ambulatory Visit: Payer: Self-pay | Admitting: Hematology and Oncology

## 2024-03-28 DIAGNOSIS — Z23 Encounter for immunization: Secondary | ICD-10-CM | POA: Diagnosis not present

## 2024-04-04 ENCOUNTER — Ambulatory Visit: Payer: Self-pay | Admitting: Adult Health

## 2024-04-04 DIAGNOSIS — Z23 Encounter for immunization: Secondary | ICD-10-CM | POA: Diagnosis not present

## 2024-04-07 DIAGNOSIS — N6313 Unspecified lump in the right breast, lower outer quadrant: Secondary | ICD-10-CM | POA: Diagnosis not present

## 2024-04-11 ENCOUNTER — Encounter (HOSPITAL_COMMUNITY): Payer: Self-pay

## 2024-04-11 NOTE — Pre-Procedure Instructions (Signed)
 Surgical Instructions   Your procedure is scheduled on April 15, 2024. Report to [facility] Main Entrance A at 6:00 A.M., then check in with the Admitting office. Any questions or running late day of surgery: call [phone]  Questions prior to your surgery date: call [phone], Monday-Friday, 8am-4pm. If you experience any cold or flu symptoms such as cough, fever, chills, shortness of breath, etc. between now and your scheduled surgery, please notify us  at the above number.     Remember:  Do not eat or drink after midnight the night before your surgery    Take these medicines the morning of surgery with A SIP OF WATER: fluticasone (FLONASE SENSIMIST) nasal spray  letrozole  (FEMARA )  mometasone -formoterol  (DULERA ) inhaler   May take these medicines IF NEEDED: albuterol  (VENTOLIN  HFA) inhaler - please bring inhaler with you to morning of surgery diphenhydrAMINE (BENADRYL ALLERGY)  famotidine (PEPCID AC)  ipratropium (ATROVENT ) nasal spray  ipratropium-albuterol  (DUONEB) nebulizer tetrahydrozoline-zinc (VISINE-AC) eye drops   One week prior to surgery, STOP taking any Aspirin (unless otherwise instructed by your surgeon) Aleve, Naproxen, Ibuprofen, Motrin, Advil, Goody's, BC's, all herbal medications, fish oil, and non-prescription vitamins.                     Do NOT Smoke (Tobacco/Vaping) for 24 hours prior to your procedure.  If you use a CPAP at night, you may bring your mask/headgear for your overnight stay.   You will be asked to remove any contacts, glasses, piercing's, hearing aid's, dentures/partials prior to surgery. Please bring cases for these items if needed.    Patients discharged the day of surgery will not be allowed to drive home, and someone needs to stay with them for 24 hours.  SURGICAL WAITING ROOM VISITATION Patients may have no more than 2 support people in the waiting area - these visitors may rotate.   Pre-op nurse will coordinate an  appropriate time for 1 ADULT support person, who may not rotate, to accompany patient in pre-op.  Children under the age of 30 must have an adult with them who is not the patient and must remain in the main waiting area with an adult.  If the patient needs to stay at the hospital during part of their recovery, the visitor guidelines for inpatient rooms apply.  Please refer to the [facility] website for the visitor guidelines for any additional information.   If you received a COVID test during your pre-op visit  it is requested that you wear a mask when out in public, stay away from anyone that may not be feeling well and notify your surgeon if you develop symptoms. If you have been in contact with anyone that has tested positive in the last 10 days please notify you surgeon.      Pre-operative CHG Bathing Instructions   You can play a key role in reducing the risk of infection after surgery. Your skin needs to be as free of germs as possible. You can reduce the number of germs on your skin by washing with CHG (chlorhexidine  gluconate) soap before surgery. CHG is an antiseptic soap that kills germs and continues to kill germs even after washing.   DO NOT use if you have an allergy to chlorhexidine /CHG or antibacterial soaps. If your skin becomes reddened or irritated, stop using the CHG and notify one of our RNs at [phone].              TAKE A SHOWER THE NIGHT  BEFORE SURGERY   Please keep in mind the following:  DO NOT shave, including legs and underarms, 48 hours prior to surgery.   You may shave your face before/day of surgery.  Place clean sheets on your bed the night before surgery Use a clean washcloth (not used since being washed) for shower. DO NOT sleep with pet's night before surgery.  CHG Shower Instructions:  Wash your face and private area with normal soap. If you choose to wash your hair, wash first with your normal shampoo.  After you use shampoo/soap, rinse your  hair and body thoroughly to remove shampoo/soap residue.  Turn the water OFF and apply half the bottle of CHG soap to a CLEAN washcloth.  Apply CHG soap ONLY FROM YOUR NECK DOWN TO YOUR TOES (washing for 3-5 minutes)  DO NOT use CHG soap on face, private areas, open wounds, or sores.  Pay special attention to the area where your surgery is being performed.  If you are having back surgery, having someone wash your back for you may be helpful. Wait 2 minutes after CHG soap is applied, then you may rinse off the CHG soap.  Pat dry with a clean towel  Put on clean pajamas    Additional instructions for the day of surgery: If you choose, you may shower the morning of surgery with an antibacterial soap.  DO NOT APPLY any lotions, deodorants, cologne, or perfumes.   Do not wear jewelry or makeup Do not wear nail polish, gel polish, artificial nails, or any other type of covering on natural nails (fingers and toes) Do not bring valuables to the hospital. [facility] is not responsible for valuables/personal belongings. Put on clean/comfortable clothes.  Please brush your teeth.  Ask your nurse before applying any prescription medications to the skin.

## 2024-04-12 ENCOUNTER — Encounter (HOSPITAL_COMMUNITY)
Admission: RE | Admit: 2024-04-12 | Discharge: 2024-04-12 | Disposition: A | Discharge status: Home / Self Care | Source: Ambulatory Visit | Attending: General Surgery | Admitting: General Surgery

## 2024-04-12 ENCOUNTER — Encounter (HOSPITAL_COMMUNITY): Payer: Self-pay

## 2024-04-12 ENCOUNTER — Other Ambulatory Visit: Payer: Self-pay

## 2024-04-12 VITALS — BP 113/96 | HR 96 | Temp 97.9°F | Resp 16 | Ht 63.0 in | Wt 104.4 lb

## 2024-04-12 DIAGNOSIS — Z79899 Other long term (current) drug therapy: Secondary | ICD-10-CM | POA: Diagnosis not present

## 2024-04-12 DIAGNOSIS — K449 Diaphragmatic hernia without obstruction or gangrene: Secondary | ICD-10-CM | POA: Insufficient documentation

## 2024-04-12 DIAGNOSIS — K900 Celiac disease: Secondary | ICD-10-CM | POA: Diagnosis not present

## 2024-04-12 DIAGNOSIS — J439 Emphysema, unspecified: Secondary | ICD-10-CM | POA: Insufficient documentation

## 2024-04-12 DIAGNOSIS — Z01818 Encounter for other preprocedural examination: Secondary | ICD-10-CM | POA: Insufficient documentation

## 2024-04-12 DIAGNOSIS — Z8673 Personal history of transient ischemic attack (TIA), and cerebral infarction without residual deficits: Secondary | ICD-10-CM | POA: Diagnosis not present

## 2024-04-12 DIAGNOSIS — K219 Gastro-esophageal reflux disease without esophagitis: Secondary | ICD-10-CM | POA: Insufficient documentation

## 2024-04-12 DIAGNOSIS — Z87891 Personal history of nicotine dependence: Secondary | ICD-10-CM | POA: Insufficient documentation

## 2024-04-12 HISTORY — DX: Disease of blood and blood-forming organs, unspecified: D75.9

## 2024-04-12 HISTORY — DX: Peripheral vascular disease, unspecified: I73.9

## 2024-04-12 LAB — BASIC METABOLIC PANEL WITH GFR
Anion gap: 12 (ref 5–15)
BUN: 13 mg/dL (ref 8–23)
CO2: 27 mmol/L (ref 22–32)
Calcium: 9.5 mg/dL (ref 8.9–10.3)
Chloride: 96 mmol/L — ABNORMAL LOW (ref 98–111)
Creatinine, Ser: 0.78 mg/dL (ref 0.44–1.00)
GFR, Estimated: >60 mL/min (ref >60)
Glucose, Bld: 101 mg/dL — ABNORMAL HIGH (ref 70–99)
Potassium: 4.3 mmol/L (ref 3.5–5.1)
Sodium: 135 mmol/L (ref 135–145)

## 2024-04-12 LAB — CBC
HCT: 43.9 % (ref 36.0–46.0)
Hemoglobin: 14.1 g/dL (ref 12.0–15.0)
MCH: 28.7 pg (ref 26.0–34.0)
MCHC: 32.1 g/dL (ref 30.0–36.0)
MCV: 89.2 fL (ref 80.0–100.0)
Platelets: 305 K/uL (ref 150–400)
RBC: 4.92 MIL/uL (ref 3.87–5.11)
RDW: 13.2 % (ref 11.5–15.5)
WBC: 11 K/uL — ABNORMAL HIGH (ref 4.0–10.5)
nRBC: 0 % (ref 0.0–0.2)

## 2024-04-12 NOTE — Progress Notes (Signed)
 PCP - Bernardino Dayna HAS Cardiologist - denies  PPM/ICD - denies Device Orders -  Rep Notified -   Chest x-ray - CT chest-03/26/24 EKG - 04/12/24 Stress Test - denies ECHO - 03/08/24 Cardiac Cath - denies  Sleep Study - denies CPAP -   Fasting Blood Sugar - na Checks Blood Sugar _____ times a day  Last dose of GLP1 agonist-  na GLP1 instructions:   Blood Thinner Instructions:na Aspirin Instructions:na  ERAS Protcol -NPO PRE-SURGERY Ensure or G2-   COVID TEST- na   Anesthesia review: yes- breast seed;severe emphysema,Factor 5 Leiden  Patient denies shortness of breath, fever, cough and chest pain at PAT appointment   All instructions explained to the patient, with a verbal understanding of the material. Patient agrees to go over the instructions while at home for a better understanding. The opportunity to ask questions was provided.

## 2024-04-13 ENCOUNTER — Inpatient Hospital Stay: Admitting: Hematology and Oncology

## 2024-04-13 NOTE — Anesthesia Preprocedure Evaluation (Signed)
 Anesthesia Evaluation  Patient identified by MRN, date of birth, ID band Patient awake    Reviewed: Allergy & Precautions, NPO status , Patient's Chart, lab work & pertinent test results  History of Anesthesia Complications Negative for: history of anesthetic complications  Airway Mallampati: II  TM Distance: >3 FB Neck ROM: Full    Dental  (+) Teeth Intact, Dental Advisory Given, Missing,    Pulmonary shortness of breath, COPD,  COPD inhaler, former smoker    + decreased breath sounds      Cardiovascular hypertension, Pt. on medications (-) angina + Peripheral Vascular Disease  (-) Past MI  Rhythm:Regular     Neuro/Psych    GI/Hepatic Neg liver ROS, hiatal hernia,,,  Endo/Other  negative endocrine ROS    Renal/GU negative Renal ROS     Musculoskeletal negative musculoskeletal ROS (+)    Abdominal   Peds  Hematology negative hematology ROS (+) Lab Results      Component                Value               Date                      WBC                      11.0 (H)            04/12/2024                HGB                      14.1                04/12/2024                HCT                      43.9                04/12/2024                MCV                      89.2                04/12/2024                PLT                      305                 04/12/2024              Anesthesia Other Findings   Reproductive/Obstetrics                              Anesthesia Physical Anesthesia Plan  ASA: 3  Anesthesia Plan: General   Post-op Pain Management:    Induction: Intravenous  PONV Risk Score and Plan: 4 or greater and Ondansetron , Dexamethasone , Propofol  infusion and TIVA  Airway Management Planned: LMA  Additional Equipment: None  Intra-op Plan:   Post-operative Plan: Extubation in OR  Informed Consent: I have reviewed the patients History and Physical, chart, labs  and discussed the procedure including the risks, benefits and alternatives for  the proposed anesthesia with the patient or authorized representative who has indicated his/her understanding and acceptance.     Dental advisory given  Plan Discussed with: CRNA  Anesthesia Plan Comments: (PAT note by Lynwood Hope, PA-C: 78 year old female former smoker (quit 2005) follows with pulmonology for history of severe COPD (not requiring supplemental oxygen) and recurrent lung cancer.  Last seen by Madelin Stank, NP on 02/02/2024.  History summarized in note, Recurrent Non-small cell lung cancer -with previous right upper lobectomy in 2005, lingular non-small cell treated with SBRT in September 2023 and enlarging hypermetabolic right lower lobe nodule treated in April 2024.  Recent CT chest and PET scan are concerning for possible recurrence.  Patient is high risk for undergoing tissue biopsy as she had severe COPD with emphysema with FEV1 around 32% and undergo multiple radiation treatments in the past which increase her risk for lung collapse.  Case was discussed in detail with Dr. Shelah .  Lung nodule mildly could be a component of inflammatory process.-Case discussed in detail with patient for now hold off on tissue biopsy.  Not a candidate for additional radiation therapy.  Without tissue sample would not be a candidate for chemotherapy at this time.  Will repeat CT chest in 2 months and decide on possibility for bronchoscopy with tissue sampling.  She was continued on Dulera  and as needed albuterol .  Upcoming breast surgery was also discussed.  Per note, A suspicious area in the right breast was identified on PET scan and ultrasound, and a biopsy is scheduled for next Tuesday. Proceed with the scheduled biopsy of the right breast mass.  Follows with heme/onc for history of Factor V Leiden positive, left breast cancer s/p lumpectomy 2017 and 2021.  Now found to have new right breast cancer, 1 cm mass in the  lower outer quadrant seen on recent PET scan to evaluate for possible recurrent lung cancer.  Echo 02/2024 showed LVEF 60 to 65%, normal RV systolic function, moderately elevated PASP, small pericardial effusion, mild to moderate mitral regurgitation.  Other pertinent history includes hiatal hernia, GERD on H2 blocker, celiac disease, TIA.  Preop labs reviewed, unremarkable.  EKG 04/12/2024: Normal sinus rhythm.  Rate 78.  Rightward axis.  CT chest 03/23/2024: IMPRESSION: 1. New irregular nodule of the peripheral left upper lobe measuring 1.1 x 0.8 cm. New irregular nodularity in the peripheral right lower lobe measuring 0.9 x 0.4 cm. These are nonspecific and may be infectious or inflammatory, however metastatic disease is not excluded. Repeat PET-CT could be helpful to assess for metabolic activity; alternately short interval follow-up recommended. 2. Unchanged nodules in the left apex. 3. Unchanged appearance of consolidation and ground-glass about a marking clip in the lingula. 4. Right upper lobectomy. 5. Severe bullous emphysema. 6. Coronary artery disease.  TTE 03/08/2024: 1. Left ventricular ejection fraction, by estimation, is 60 to 65%. The  left ventricle has normal function. The left ventricle has no regional  wall motion abnormalities. Left ventricular diastolic parameters were  normal. The average left ventricular  global longitudinal strain is -26.6 %. The global longitudinal strain is  normal.  2. Right ventricular systolic function is normal. The right ventricular  size is normal. There is moderately elevated pulmonary artery systolic  pressure.  3. A small pericardial effusion is present. The pericardial effusion is  circumferential.  4. The mitral valve is normal in structure. Mild to moderate mitral valve  regurgitation. No evidence of mitral stenosis.  5. The aortic valve is normal in  structure. Aortic valve regurgitation is  not visualized. No aortic  stenosis is present.  6. The inferior vena cava is normal in size with greater than 50%  respiratory variability, suggesting right atrial pressure of 3 mmHg.    )         Anesthesia Quick Evaluation

## 2024-04-13 NOTE — Progress Notes (Signed)
 Anesthesia Chart Review:  78 year old female former smoker (quit 2005) follows with pulmonology for history of severe COPD (not requiring supplemental oxygen) and recurrent lung cancer.  Last seen by Madelin Stank, NP on 02/02/2024.  History summarized in note, Recurrent Non-small cell lung cancer -with previous right upper lobectomy in 2005, lingular non-small cell treated with SBRT in September 2023 and enlarging hypermetabolic right lower lobe nodule treated in April 2024.  Recent CT chest and PET scan are concerning for possible recurrence.  Patient is high risk for undergoing tissue biopsy as she had severe COPD with emphysema with FEV1 around 32% and undergo multiple radiation treatments in the past which increase her risk for lung collapse.  Case was discussed in detail with Dr. Shelah .  Lung nodule mildly could be a component of inflammatory process.-Case discussed in detail with patient for now hold off on tissue biopsy.  Not a candidate for additional radiation therapy.  Without tissue sample would not be a candidate for chemotherapy at this time.  Will repeat CT chest in 2 months and decide on possibility for bronchoscopy with tissue sampling.  She was continued on Dulera  and as needed albuterol .  Upcoming breast surgery was also discussed.  Per note, A suspicious area in the right breast was identified on PET scan and ultrasound, and a biopsy is scheduled for next Tuesday. Proceed with the scheduled biopsy of the right breast mass.  Follows with heme/onc for history of Factor V Leiden positive, left breast cancer s/p lumpectomy 2017 and 2021.  Now found to have new right breast cancer, 1 cm mass in the lower outer quadrant seen on recent PET scan to evaluate for possible recurrent lung cancer.  Echo 02/2024 showed LVEF 60 to 65%, normal RV systolic function, moderately elevated PASP, small pericardial effusion, mild to moderate mitral regurgitation.  Other pertinent history includes hiatal  hernia, GERD on H2 blocker, celiac disease, TIA.  Preop labs reviewed, unremarkable.  EKG 04/12/2024: Normal sinus rhythm.  Rate 78.  Rightward axis.  CT chest 03/23/2024: IMPRESSION: 1. New irregular nodule of the peripheral left upper lobe measuring 1.1 x 0.8 cm. New irregular nodularity in the peripheral right lower lobe measuring 0.9 x 0.4 cm. These are nonspecific and may be infectious or inflammatory, however metastatic disease is not excluded. Repeat PET-CT could be helpful to assess for metabolic activity; alternately short interval follow-up recommended. 2. Unchanged nodules in the left apex. 3. Unchanged appearance of consolidation and ground-glass about a marking clip in the lingula. 4. Right upper lobectomy. 5. Severe bullous emphysema. 6. Coronary artery disease.  TTE 03/08/2024: 1. Left ventricular ejection fraction, by estimation, is 60 to 65%. The  left ventricle has normal function. The left ventricle has no regional  wall motion abnormalities. Left ventricular diastolic parameters were  normal. The average left ventricular  global longitudinal strain is -26.6 %. The global longitudinal strain is  normal.   2. Right ventricular systolic function is normal. The right ventricular  size is normal. There is moderately elevated pulmonary artery systolic  pressure.   3. A small pericardial effusion is present. The pericardial effusion is  circumferential.   4. The mitral valve is normal in structure. Mild to moderate mitral valve  regurgitation. No evidence of mitral stenosis.   5. The aortic valve is normal in structure. Aortic valve regurgitation is  not visualized. No aortic stenosis is present.   6. The inferior vena cava is normal in size with greater than 50%  respiratory  variability, suggesting right atrial pressure of 3 mmHg.      Lynwood Geofm RIGGERS [facility]/Anesthesiology Phone [phone] 04/13/2024 1:57 PM

## 2024-04-15 ENCOUNTER — Encounter (HOSPITAL_COMMUNITY)
Admission: RE | Disposition: A | Discharge status: Home / Self Care | Payer: Self-pay | Source: Ambulatory Visit | Attending: General Surgery

## 2024-04-15 ENCOUNTER — Telehealth: Payer: Self-pay | Admitting: *Deleted

## 2024-04-15 ENCOUNTER — Encounter (HOSPITAL_COMMUNITY): Admitting: Physician Assistant

## 2024-04-15 ENCOUNTER — Ambulatory Visit (HOSPITAL_COMMUNITY): Admitting: Anesthesiology

## 2024-04-15 ENCOUNTER — Ambulatory Visit (HOSPITAL_COMMUNITY)
Admission: RE | Admit: 2024-04-15 | Discharge: 2024-04-15 | Disposition: A | Discharge status: Home / Self Care | Source: Ambulatory Visit | Attending: General Surgery | Admitting: General Surgery

## 2024-04-15 ENCOUNTER — Encounter (HOSPITAL_COMMUNITY): Payer: Self-pay | Admitting: General Surgery

## 2024-04-15 ENCOUNTER — Other Ambulatory Visit: Payer: Self-pay

## 2024-04-15 DIAGNOSIS — J439 Emphysema, unspecified: Secondary | ICD-10-CM | POA: Diagnosis not present

## 2024-04-15 DIAGNOSIS — Z87891 Personal history of nicotine dependence: Secondary | ICD-10-CM | POA: Insufficient documentation

## 2024-04-15 DIAGNOSIS — K900 Celiac disease: Secondary | ICD-10-CM | POA: Diagnosis not present

## 2024-04-15 DIAGNOSIS — Z8673 Personal history of transient ischemic attack (TIA), and cerebral infarction without residual deficits: Secondary | ICD-10-CM | POA: Diagnosis not present

## 2024-04-15 DIAGNOSIS — I739 Peripheral vascular disease, unspecified: Secondary | ICD-10-CM | POA: Diagnosis not present

## 2024-04-15 DIAGNOSIS — I340 Nonrheumatic mitral (valve) insufficiency: Secondary | ICD-10-CM | POA: Insufficient documentation

## 2024-04-15 DIAGNOSIS — I3139 Other pericardial effusion (noninflammatory): Secondary | ICD-10-CM | POA: Diagnosis not present

## 2024-04-15 DIAGNOSIS — Z902 Acquired absence of lung [part of]: Secondary | ICD-10-CM | POA: Diagnosis not present

## 2024-04-15 DIAGNOSIS — Z8249 Family history of ischemic heart disease and other diseases of the circulatory system: Secondary | ICD-10-CM | POA: Diagnosis not present

## 2024-04-15 DIAGNOSIS — I10 Essential (primary) hypertension: Secondary | ICD-10-CM | POA: Diagnosis not present

## 2024-04-15 DIAGNOSIS — Z923 Personal history of irradiation: Secondary | ICD-10-CM | POA: Insufficient documentation

## 2024-04-15 DIAGNOSIS — Z1721 Progesterone receptor positive status: Secondary | ICD-10-CM | POA: Diagnosis not present

## 2024-04-15 DIAGNOSIS — K449 Diaphragmatic hernia without obstruction or gangrene: Secondary | ICD-10-CM | POA: Insufficient documentation

## 2024-04-15 DIAGNOSIS — Z170 Estrogen receptor positive status [ER+]: Secondary | ICD-10-CM | POA: Insufficient documentation

## 2024-04-15 DIAGNOSIS — Z85118 Personal history of other malignant neoplasm of bronchus and lung: Secondary | ICD-10-CM | POA: Diagnosis not present

## 2024-04-15 DIAGNOSIS — C50511 Malignant neoplasm of lower-outer quadrant of right female breast: Secondary | ICD-10-CM | POA: Insufficient documentation

## 2024-04-15 DIAGNOSIS — Z79899 Other long term (current) drug therapy: Secondary | ICD-10-CM | POA: Diagnosis not present

## 2024-04-15 DIAGNOSIS — K219 Gastro-esophageal reflux disease without esophagitis: Secondary | ICD-10-CM | POA: Diagnosis not present

## 2024-04-15 DIAGNOSIS — D6851 Activated protein C resistance: Secondary | ICD-10-CM | POA: Insufficient documentation

## 2024-04-15 DIAGNOSIS — C50911 Malignant neoplasm of unspecified site of right female breast: Secondary | ICD-10-CM | POA: Diagnosis not present

## 2024-04-15 DIAGNOSIS — Z1731 Human epidermal growth factor receptor 2 positive status: Secondary | ICD-10-CM | POA: Diagnosis not present

## 2024-04-15 DIAGNOSIS — Z825 Family history of asthma and other chronic lower respiratory diseases: Secondary | ICD-10-CM | POA: Diagnosis not present

## 2024-04-15 SURGERY — LUMPECTOMY WITH MAGNETIC MARKER LOCALIZATION
Anesthesia: Monitor Anesthesia Care | Site: Breast | Laterality: Right

## 2024-04-15 MED ORDER — LIDOCAINE 2% (20 MG/ML) 5 ML SYRINGE
INTRAMUSCULAR | Status: AC
  Filled 2024-04-15: qty 5

## 2024-04-15 MED ORDER — PROPOFOL 10 MG/ML IV BOLUS
INTRAVENOUS | Status: AC
Start: 2024-04-15 — End: 2024-04-15
  Filled 2024-04-15: qty 20

## 2024-04-15 MED ORDER — TRAMADOL HCL 50 MG PO TABS: 50.0000 mg | ORAL_TABLET | Freq: Four times a day (QID) | ORAL | 0 refills | Status: AC | PRN

## 2024-04-15 MED ORDER — CHLORHEXIDINE GLUCONATE CLOTH 2 % EX PADS: 6.0000 | MEDICATED_PAD | Freq: Once | CUTANEOUS | Status: DC

## 2024-04-15 MED ORDER — BUPIVACAINE-EPINEPHRINE (PF) 0.25% -1:200000 IJ SOLN
INTRAMUSCULAR | Status: AC
  Filled 2024-04-15: qty 30

## 2024-04-15 MED ORDER — BUPIVACAINE-EPINEPHRINE 0.25% -1:200000 IJ SOLN
INTRAMUSCULAR | Status: DC | PRN
  Administered 2024-04-15: 24 mL

## 2024-04-15 MED ORDER — CEFAZOLIN SODIUM-DEXTROSE 2-4 GM/100ML-% IV SOLN
2.0000 g | INTRAVENOUS | Status: AC
  Administered 2024-04-15: 2 g via INTRAVENOUS
  Filled 2024-04-15: qty 100

## 2024-04-15 MED ORDER — ACETAMINOPHEN 500 MG PO TABS
1000.0000 mg | ORAL_TABLET | ORAL | Status: AC
Start: 2024-04-15 — End: 2024-04-15
  Administered 2024-04-15: 1000 mg via ORAL
  Filled 2024-04-15: qty 2

## 2024-04-15 MED ORDER — ONDANSETRON HCL 4 MG/2ML IJ SOLN
INTRAMUSCULAR | Status: DC | PRN
  Administered 2024-04-15: 4 mg via INTRAVENOUS

## 2024-04-15 MED ORDER — ORAL CARE MOUTH RINSE: 15.0000 mL | Freq: Once | OROMUCOSAL | Status: AC

## 2024-04-15 MED ORDER — FENTANYL CITRATE (PF) 100 MCG/2ML IJ SOLN
INTRAMUSCULAR | Status: DC | PRN
  Administered 2024-04-15 (×2): 25 ug via INTRAVENOUS
  Administered 2024-04-15: 50 ug via INTRAVENOUS

## 2024-04-15 MED ORDER — 0.9 % SODIUM CHLORIDE (POUR BTL) OPTIME
TOPICAL | Status: DC | PRN
  Administered 2024-04-15: 1000 mL

## 2024-04-15 MED ORDER — LACTATED RINGERS IV SOLN: INTRAVENOUS | Status: DC

## 2024-04-15 MED ORDER — CHLORHEXIDINE GLUCONATE 0.12 % MT SOLN
15.0000 mL | Freq: Once | OROMUCOSAL | Status: AC
  Administered 2024-04-15: 15 mL via OROMUCOSAL
  Filled 2024-04-15: qty 15

## 2024-04-15 MED ORDER — FENTANYL CITRATE (PF) 100 MCG/2ML IJ SOLN
INTRAMUSCULAR | Status: AC
  Filled 2024-04-15: qty 2

## 2024-04-15 MED ORDER — GABAPENTIN 100 MG PO CAPS
100.0000 mg | ORAL_CAPSULE | ORAL | Status: AC
  Administered 2024-04-15: 100 mg via ORAL
  Filled 2024-04-15: qty 1

## 2024-04-15 MED ORDER — PROPOFOL 500 MG/50ML IV EMUL
INTRAVENOUS | Status: DC | PRN
  Administered 2024-04-15: 30 ug/kg/min via INTRAVENOUS

## 2024-04-15 SURGICAL SUPPLY — 30 items
BAG COUNTER SPONGE SURGICOUNT (BAG) ×1 IMPLANT
BINDER BREAST LRG (GAUZE/BANDAGES/DRESSINGS) IMPLANT
BINDER BREAST XLRG (GAUZE/BANDAGES/DRESSINGS) IMPLANT
CANISTER SUCTION 3000ML PPV (SUCTIONS) ×1 IMPLANT
CHLORAPREP W/TINT 26 (MISCELLANEOUS) ×1 IMPLANT
CLIP APPLIE 9.375 MED OPEN (MISCELLANEOUS) IMPLANT
CLIP TI MEDIUM 6 (CLIP) IMPLANT
COVER PROBE W GEL 5X96 (DRAPES) ×1 IMPLANT
COVER SURGICAL LIGHT HANDLE (MISCELLANEOUS) ×1 IMPLANT
DERMABOND ADVANCED .7 DNX12 (GAUZE/BANDAGES/DRESSINGS) ×1 IMPLANT
DEVICE DUBIN SPECIMEN MAMMOGRA (MISCELLANEOUS) ×1 IMPLANT
DRAPE CHEST BREAST 15X10 FENES (DRAPES) ×1 IMPLANT
ELECT COATED BLADE 2.86 ST (ELECTRODE) ×1 IMPLANT
ELECTRODE REM PT RTRN 9FT ADLT (ELECTROSURGICAL) ×1 IMPLANT
GLOVE BIO SURGEON STRL SZ7.5 (GLOVE) ×1 IMPLANT
GOWN STRL REUS W/ TWL LRG LVL3 (GOWN DISPOSABLE) ×2 IMPLANT
KIT BASIN OR (CUSTOM PROCEDURE TRAY) ×1 IMPLANT
KIT MARKER MARGIN INK (KITS) ×1 IMPLANT
LIGHT WAVEGUIDE WIDE FLAT (MISCELLANEOUS) IMPLANT
NDL HYPO 25GX1X1/2 BEV (NEEDLE) ×1 IMPLANT
NEEDLE HYPO 25GX1X1/2 BEV (NEEDLE) ×1 IMPLANT
PACK GENERAL/GYN (CUSTOM PROCEDURE TRAY) ×1 IMPLANT
SOLN 0.9% NACL POUR BTL 1000ML (IV SOLUTION) ×1 IMPLANT
STRIP CLOSURE SKIN 1/2X4 (GAUZE/BANDAGES/DRESSINGS) ×1 IMPLANT
SUT MNCRL AB 4-0 PS2 18 (SUTURE) ×1 IMPLANT
SUT VIC AB 3-0 SH 18 (SUTURE) IMPLANT
SUT VIC AB 3-0 SH 27X BRD (SUTURE) ×1 IMPLANT
SYR CONTROL 10ML LL (SYRINGE) ×1 IMPLANT
TOWEL GREEN STERILE (TOWEL DISPOSABLE) ×1 IMPLANT
TOWEL GREEN STERILE FF (TOWEL DISPOSABLE) ×1 IMPLANT

## 2024-04-15 NOTE — Interval H&P Note (Signed)
 History and Physical Interval Note:  04/15/2024 7:24 AM  Jessica Gilbert  has presented today for surgery, with the diagnosis of RIGHT BREAST CANCER.  The various methods of treatment have been discussed with the patient and family. After consideration of risks, benefits and other options for treatment, the patient has consented to  Procedure(s) with comments: LUMPECTOMY WITH MAGNETIC MARKER LOCALIZATION (Right) - LOCAL & MAC RIGHT BREAST SEED LUMPECTOMY as a surgical intervention.  The patient's history has been reviewed, patient examined, no change in status, stable for surgery.  I have reviewed the patient's chart and labs.  Questions were answered to the patient's satisfaction.     Deward Null III

## 2024-04-15 NOTE — Op Note (Signed)
 04/15/2024  8:34 AM  PATIENT:  Jessica Gilbert  78 y.o. female  PRE-OPERATIVE DIAGNOSIS:  RIGHT BREAST CANCER  POST-OPERATIVE DIAGNOSIS:  RIGHT BREAST CANCER  PROCEDURE:  Procedure(s): RIGHT BREAST LUMPECTOMY WITH MAGNETIC MARKER LOCALIZATION (Right)  SURGEON:  Surgeons and Role:    * Curvin Deward MOULD, MD - Primary  PHYSICIAN ASSISTANT:   ASSISTANTS: none   ANESTHESIA:   local and IV sedation  EBL:  minimal   BLOOD ADMINISTERED:none  DRAINS: none   LOCAL MEDICATIONS USED:  MARCAINE      SPECIMEN:  Source of Specimen:  right breast tissue  DISPOSITION OF SPECIMEN:  PATHOLOGY  COUNTS:  YES  TOURNIQUET:  * No tourniquets in log *  DICTATION: .Dragon Dictation  After informed consent was obtained the patient was brought to the operating room and placed in the supine position on the operating table.  After adequate IV sedation had been given the patient's right breast was prepped with ChloraPrep, allowed to dry, and draped in usual sterile manner.  An appropriate timeout was performed.  Previously a Magseed was placed in the lower outer right breast to mark an area of invasive breast cancer.  The Sentimag was used to identify the signal.  The area around this was infiltrated with quarter percent Marcaine .  A radially oriented elliptical incision was made with a 15 blade knife overlying the area of signal.  The incision was carried through the skin and subcutaneous tissue sharply with the electrocautery.  Dissection was carried widely around the signal while checking the signal frequently.  The dissection was carried all the way to the muscle of the chest wall.  Once the tissue was removed it was oriented with the appropriate paint colors.  A specimen radiograph was obtained that showed the clip and seed to be near the center of the specimen.  The specimen was then sent to pathology for further evaluation.  Hemostasis was achieved using the Bovie electrocautery.  The wound was  irrigated with saline and infiltrated with more quarter percent Marcaine .  The deep layer of the incision could not be closed as it caused too much puckering.  The incision was then closed with interrupted 4-0 Monocryl subcuticular stitches.  Dermabond dressings were applied.  The patient tolerated the procedure well.  At the end of the case all needle sponge and instrument counts were correct.  The patient was then awakened and taken to recovery in stable condition.  PLAN OF CARE: Discharge to home after PACU  PATIENT DISPOSITION:  PACU - hemodynamically stable.   Delay start of Pharmacological VTE agent (>24hrs) due to surgical blood loss or risk of bleeding: not applicable

## 2024-04-15 NOTE — Transfer of Care (Signed)
 Immediate Anesthesia Transfer of Care Note  Patient: Jessica Gilbert  Procedure(s) Performed: LUMPECTOMY WITH MAGNETIC MARKER LOCALIZATION (Right: Breast) LUMPECTOMY WITH MAGNETIC MARKER LOCALIZATION (Right: Breast)  Patient Location: PACU  Anesthesia Type:MAC  Level of Consciousness: awake, alert , and oriented  Airway & Oxygen Therapy: Patient Spontanous Breathing and Patient connected to nasal cannula oxygen  Post-op Assessment: Report given to RN and Post -op Vital signs reviewed and stable  Post vital signs: Reviewed and stable  Last Vitals:  Vitals Value Taken Time  BP 115/89 04/15/24 08:39  Temp 36.7 C 04/15/24 08:39  Pulse 86 04/15/24 08:43  Resp 19 04/15/24 08:43  SpO2 97 % 04/15/24 08:43  Vitals shown include unfiled device data.  Last Pain:  Vitals:   04/15/24 0839  TempSrc:   PainSc: 0-No pain         Complications: No notable events documented.

## 2024-04-15 NOTE — H&P (Signed)
 REFERRING PHYSICIAN: [place], Horatio Mammo* PROVIDER: DEWARD GARNETTE NULL, MD MRN: [id number] DOB: 08-10-1945 Subjective   Chief Complaint: Breast Cancer  History of Present Illness: Jessica Gilbert is a 78 y.o. female who is seen today as an office consultation for evaluation of Breast Cancer  We are asked to see the patient in consultation by Dr. Vonzell to evaluate her for a new right breast cancer. The patient is a 78 year old white female who recently had a PET scan to evaluate for possible recurrent lung cancer. At that time she was found to have a 1 cm mass in the lower outer quadrant of the right breast. The axilla looked normal. The mass was biopsied and came back as a grade 1 invasive ductal cancer that was ER and PR positive and HER2 positive with a Ki-67 of 15%. She is also 3-1/2 years status post left breast lumpectomy and sentinel node biopsy for a T1b N0 recurrent left breast cancer. She had some trouble 3 years ago tolerating antiestrogens. She states that she likely cannot have any more radiation on her right or left sides. She does have pretty significant lung disease  Review of Systems: A complete review of systems was obtained from the patient. I have reviewed this information and discussed as appropriate with the patient. See HPI as well for other ROS.  ROS   Medical History: Past Medical History:  Diagnosis Date  Asthma, unspecified asthma severity, unspecified whether complicated, unspecified whether persistent (HHS-HCC)  Cancer (CMS/HHS-HCC)  COPD (chronic obstructive pulmonary disease) (CMS/HHS-HCC)  Hyperlipidemia  Hypertension  Lung cancer (CMS/HHS-HCC)  s/p chemo, radiation and lung lob resection  Stroke (CMS/HHS-HCC)   Patient Active Problem List  Diagnosis  Hypertension  Malignant neoplasm of lower-outer quadrant of left breast of female, estrogen receptor positive (CMS/HHS-HCC)  Malignant neoplasm of lower-outer quadrant of right breast of female,  estrogen receptor positive (CMS/HHS-HCC)   Past Surgical History:  Procedure Laterality Date  HERNIA REPAIR  THORACOSCOPY WITH LOBECTOMY LUNG    Allergies  Allergen Reactions  Aspirin Other (See Comments)  Stomach upset burning stomach  Budesonide-Formoterol  Other (See Comments)  Severe sore throat  Codeine Nausea And Vomiting and Nausea  Fluticasone Propion-Salmeterol Other (See Comments)  cough  Pravastatin Other (See Comments)  Ankle swelling  Rosuvastatin Other (See Comments)  Fatigue, achy all over  Tiotropium Bromide  Other (See Comments)  Spiriva  Handihaler severe sore throat, cough   Current Outpatient Medications on File Prior to Visit  Medication Sig Dispense Refill  albuterol  (VENTOLIN  HFA;PROAIR  HFA) 90 mcg/actuation inhaler Inhale 2 inhalations into the lungs every 6 (six) hours as needed for Wheezing.  diphenhydrAMINE (BENADRYL) 25 mg tablet Take 25 mg by mouth  fluticasone (FLONASE SENSIMIST) 27.5 mcg/actuation nasal spray Place 1 spray into one nostril  guaiFENesin  (MUCINEX ) 600 mg SR tablet Take 600 mg by mouth  ibuprofen (MOTRIN) 200 MG tablet Take 200 mg by mouth  ipratropium (ATROVENT ) 21 mcg (0.03 %) nasal spray Place 2 sprays into both nostrils once daily as needed for Rhinitis  ipratropium-albuteroL  (DUO-NEB) nebulizer solution Inhale 3 mLs into the lungs  mometasone -formoterol  (DULERA ) 200-5 mcg/actuation inhaler Inhale 2 inhalations into the lungs 2 (two) times daily  multivitamin tablet Take 1 tablet by mouth daily.  telmisartan (MICARDIS) 40 MG tablet Take 0.5 tablets (20 mg total) by mouth daily.  tetrahydrozoline-zinc (VISINE-AC) 0.05-0.25 % ophthalmic solution Apply 1 drop to eye  codeine-guaifenesin  10-100 mg/5 mL oral liquid Take by mouth every 4 (four) hours as needed for Cough.  omega-3 fatty acids-vitamin E (FISH OIL) 1,000 mg Take by mouth.  predniSONE  (DELTASONE ) 20 MG tablet Take 60mg  x 5 days, then 40mg  x 5 days, then 30mg  x 5 days,  then 20mg  x 5 days, then 10mg  x 5 days 45 tablet 0   No current facility-administered medications on file prior to visit.   Family History  Problem Relation Age of Onset  Skin cancer Father  High blood pressure (Hypertension) Father  Diabetes Father  Myocardial Infarction (Heart attack) Father  Myocardial Infarction (Heart attack) Brother  Diabetes Brother  High blood pressure (Hypertension) Brother  Skin cancer Brother  Asthma Son    Social History   Tobacco Use  Smoking Status Former  Current packs/day: 0.00  Types: Cigarettes  Quit date: 06/02/2004  Years since quitting: 19.7  Smokeless Tobacco Never    Social History   Socioeconomic History  Marital status: Single  Tobacco Use  Smoking status: Former  Current packs/day: 0.00  Types: Cigarettes  Quit date: 06/02/2004  Years since quitting: 19.7  Smokeless tobacco: Never  Substance and Sexual Activity  Alcohol use: Yes  Drug use: No   Social Drivers of Health   Housing Stability: Unknown (02/18/2024)  Housing Stability Vital Sign  Homeless in the Last Year: No   Objective:  There were no vitals filed for this visit.  There is no height or weight on file to calculate BMI.  Physical Exam Vitals reviewed.  Constitutional:  General: She is not in acute distress. Appearance: Normal appearance.  HENT:  Head: Normocephalic and atraumatic.  Right Ear: External ear normal.  Left Ear: External ear normal.  Nose: Nose normal.  Mouth/Throat:  Mouth: Mucous membranes are moist.  Pharynx: Oropharynx is clear.  Eyes:  General: No scleral icterus. Extraocular Movements: Extraocular movements intact.  Conjunctiva/sclera: Conjunctivae normal.  Pupils: Pupils are equal, round, and reactive to light.  Cardiovascular:  Rate and Rhythm: Normal rate and regular rhythm.  Pulses: Normal pulses.  Heart sounds: Normal heart sounds.  Pulmonary:  Breath sounds: Normal breath sounds.  Comments: She does use a lot of  accessory respiratory muscles to breathe Abdominal:  General: Bowel sounds are normal.  Palpations: Abdomen is soft.  Tenderness: There is no abdominal tenderness.  Musculoskeletal:  General: No swelling, tenderness or deformity. Normal range of motion.  Cervical back: Normal range of motion and neck supple.  Skin: General: Skin is warm and dry.  Coloration: Skin is not jaundiced.  Neurological:  General: No focal deficit present.  Mental Status: She is alert and oriented to person, place, and time.  Psychiatric:  Mood and Affect: Mood normal.  Behavior: Behavior normal.     Breast: There appears to be a bruise that measures about a centimeter in the lower outer right breast. There is no palpable mass in the left breast. There is no palpable axillary, supraclavicular, or cervical lymphadenopathy.  Labs, Imaging and Diagnostic Testing:  Assessment and Plan:   Diagnoses and all orders for this visit:  Malignant neoplasm of lower-outer quadrant of right breast of female, estrogen receptor positive (CMS/HHS-HCC)   The patient appears to have a 1 cm cancer in the lower outer quadrant of the right breast. She also appears to have recurrent lung cancer. She has significant underlying lung disease. We will begin by trying to get pulmonary clearance from her pulmonologist to see if she can even be put to sleep. If she cannot then we could try to consider lumpectomy under local anesthesia only. We would  not need a node evaluation. She will meet with her medical oncologist to discuss the possibility also of either adjuvant or neoadjuvant therapy or maintenance therapy with antiestrogens if she cannot undergo surgery. We will call her once we have her clearance from her pulmonologist and then consult with her medical oncologist as well about how to proceed.

## 2024-04-15 NOTE — Telephone Encounter (Signed)
 Copied from CRM #[id number]. Topic: Clinical - Order For Equipment >> Apr 15, 2024  1:35 PM Whitney O wrote: Reason for CRM: patient son is calling because she had surgey this morning dr curvin and her breathing has not been good lately . oxygen is low when she do any type of physical activity when she walked in hospital her oxygen was 90 and they instantly put her on oxygen   . Patient son is looking into doing something before her 2 week appointment with dr byrum  . She tired all the time . She is irritable . She is anxious. Patient has a appointment in 2 weeks  but mr mike her son is wanting to see what can be done before then  Mr [facility] requesting a prescription to be put in for oxygen and he is saying a portable oxygen would be great The imaging machine  She can't get any rest she needs assistance .  [phone] mr mike her son and he is on the DPR form  I called and spoke with the pt's son, Garrel, ok per DPR. He states that pt has been having increased fatigue lately and her sats this morning before her surgery were 90% ra. She had to be placed on o2 during her procedure. He is asking for rx for o2. Pt used to have this at home but when no longer needed it was sent back. I advised this will require appt. I offered to move up the ov with Byrum or APP and he refused, stating that she wants to keep her planned appt for Byrum on 05/06/24. I advised to seek emergent care if her o2 starts dropping below 90 or she started having any increased SOB or other respiratory symptoms. Nothing further needed.

## 2024-04-18 LAB — SURGICAL PATHOLOGY

## 2024-04-19 ENCOUNTER — Ambulatory Visit: Payer: Self-pay | Admitting: General Surgery

## 2024-04-21 NOTE — Anesthesia Postprocedure Evaluation (Signed)
 Anesthesia Post Note  Patient: NALLELY YOST  Procedure(s) Performed: LUMPECTOMY WITH MAGNETIC MARKER LOCALIZATION (Right: Breast) LUMPECTOMY WITH MAGNETIC MARKER LOCALIZATION (Right: Breast)     Patient location during evaluation: PACU Anesthesia Type: MAC Level of consciousness: awake and alert Pain management: pain level controlled Vital Signs Assessment: post-procedure vital signs reviewed and stable Respiratory status: spontaneous breathing, nonlabored ventilation, respiratory function stable and patient connected to nasal cannula oxygen Cardiovascular status: stable and blood pressure returned to baseline Postop Assessment: no apparent nausea or vomiting Anesthetic complications: no   No notable events documented.                 Chimaobi Casebolt

## 2024-04-28 ENCOUNTER — Telehealth: Payer: Self-pay | Admitting: Hematology and Oncology

## 2024-04-28 NOTE — Telephone Encounter (Signed)
-----   Message from Nurse Nanetta HERO sent at 04/28/2024 12:06 PM EST ----- Regarding: RE: app Ok. I would make sure that you document that she didn't want to come in.   Thanks ----- Message ----- From: Trudy Gain Sent: 04/28/2024  11:06 AM EST To: Nanetta LOISE Lars, RN Subject: RE: app                                        I called patient and she refused to schedule stating she doesn't need to come in at this time - she's scheduled to follow up with her surgeon. ----- Message ----- From: Lars Nanetta LOISE, RN Sent: 04/20/2024   4:30 PM EST To: Gain Trudy Subject: app                                            Schedule post op follow up with Dr. Loretha week of 11/17

## 2024-04-28 NOTE — Telephone Encounter (Signed)
 I spoke with patient to schedule a post op follow up with Dr. Loretha per staff message from nurse on 04/20/2024. Patient stated she did not want to schedule at this time since she will be following up with her surgeon.

## 2024-05-06 ENCOUNTER — Encounter: Payer: Self-pay | Admitting: Emergency Medicine

## 2024-05-06 ENCOUNTER — Telehealth: Payer: Self-pay

## 2024-05-06 ENCOUNTER — Ambulatory Visit: Admitting: Emergency Medicine

## 2024-05-06 VITALS — BP 126/64 | HR 100 | Temp 98.0°F | Ht 63.0 in | Wt 101.2 lb

## 2024-05-06 DIAGNOSIS — J438 Other emphysema: Secondary | ICD-10-CM

## 2024-05-06 DIAGNOSIS — R918 Other nonspecific abnormal finding of lung field: Secondary | ICD-10-CM

## 2024-05-06 MED ORDER — STIOLTO RESPIMAT 2.5-2.5 MCG/ACT IN AERS: 2.0000 | INHALATION_SPRAY | Freq: Every day | RESPIRATORY_TRACT | 11 refills | Status: DC

## 2024-05-06 NOTE — Patient Instructions (Signed)
 Follow-up with Dr. Curvin as planned We will repeat your CT scan of the chest in January 2026 to follow pulmonary nodules Stop Dulera  Try starting Stiolto 2 puffs once daily. We will perform a walking oximetry today and obtain a portable oxygen concentrator for you to use with exertion. Follow Dr. Shelah in January after CT chest so we can review those results together.

## 2024-05-06 NOTE — Assessment & Plan Note (Signed)
 She has significant exertional shortness of breath, is unclear whether she has improved with any of the inhaled medication that she has been on.  She questions whether she was worse on Trelegy and Breztri .  We do have notes to confirm that she did not tolerate powdered inhalers.  Will to try her on Stiolto to see if she benefits.  Importantly she has noticed desaturations and part of her failure to improve with BD may be that she is desaturating.  We will perform a walking oximetry today and get her exertional oxygen if she qualifies.

## 2024-05-06 NOTE — Telephone Encounter (Signed)
 Pt qualified for supplemental oxygen today at ov.  Order was placed as urgent. [facility] can we get this sent ASAP

## 2024-05-06 NOTE — Progress Notes (Signed)
 Subjective:    Patient ID: Jessica Gilbert, female    DOB: 03/07/1946, 78 y.o.   MRN: [id number]  HPI  ROV 05/06/2024 --Mrs. Mclear is a 70 with a history of breast cancer, severe COPD, non-small cell lung cancer of the right upper lobe in the lingula (right upper lobe lobectomy 2005, SBRT to the lingula 2023), breast cancer that was diagnosed this year for which she underwent lumpectomy 04/15/2024.  She has chronic allergic symptoms.  She has been followed with serial imaging and has had bilateral pulmonary nodules on CT chest.  She has significant exertional SOB, has seen some desats when doing activity. She has been on breztri  before, Trelegy. She felt that both of these made her breathing worse instead of better. She is currently on dulera .   CT scan of the chest 03/23/2024 reviewed by me shows no mediastinal or hilar adenopathy, evidence for of her right upper lobe lobectomy, severe bullous emphysema, new nodularity in the peripheral right lower lobe 9 x 4 mm, unchanged ground glass and consolidation in the lingula with a fiducial marker, new irregular left upper lobe nodule 11 x 8 mm, multiple unchanged nodules in the left upper lobe   Review of Systems As per HPI  Past Medical History:  Diagnosis Date   Acute respiratory failure (HCC)    Blood dyscrasia    Breast cancer (HCC)    left breast   Carotid stenosis, right    Celiac disease    Dyspnea    Emphysema lung (HCC)    Factor 5 Leiden mutation, heterozygous    States no symptoms   History of hiatal hernia    History of radiation therapy    Left Lung-03/24/22-04/04/22- Dr. Lynwood Nasuti   Human metapneumovirus pneumonia 08/2013   Hyperlipidemia    Hypertension    Lung cancer (HCC) 2005   Rt upper lobe   Peripheral vascular disease    Carotid Artery Stenosis   Stroke (HCC)    TIA w/o defecits     Family History  Problem Relation Age of Onset   Heart disease Father    High blood pressure Father    Congestive Heart  Failure Father    Leukemia Mother    Colon polyps Mother    High blood pressure Brother    Lung cancer Brother    Heart disease Brother    Diabetes Brother    Colon polyps Sister    Breast cancer Sister    High blood pressure Sister    Emphysema Sister    Pancreatic cancer Paternal Uncle      Social History   Socioeconomic History   Marital status: Divorced    Spouse name: Not on file   Number of children: Not on file   Years of education: Not on file   Highest education level: Not on file  Occupational History   Not on file  Tobacco Use   Smoking status: Former    Current packs/day: 0.00    Average packs/day: 1 pack/day for 35.0 years (35.0 ttl pk-yrs)    Types: Cigarettes    Start date: 01/28/1969    Quit date: 01/29/2004    Years since quitting: 20.2   Smokeless tobacco: Never  Vaping Use   Vaping status: Never Used  Substance and Sexual Activity   Alcohol use: Not Currently   Drug use: No   Sexual activity: Not on file  Other Topics Concern   Not on file  Social History Narrative  Not on file   Social Drivers of Health   Financial Resource Strain: Not on file  Food Insecurity: Not on file  Transportation Needs: Not on file  Physical Activity: Not on file  Stress: Not on file  Social Connections: Not on file  Intimate Partner Violence: Not on file    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  No inhaled exposures to her knowledge    Allergies  Allergen Reactions   Aspirin Other (See Comments)    burning stomach   Codeine Nausea Only   Vitamin D Analogs Other (See Comments)    Optical migraines      Outpatient Medications Prior to Visit  Medication Sig Dispense Refill   albuterol  (VENTOLIN  HFA) 108 (90 Base) MCG/ACT inhaler Inhale 1 puff into the lungs every 4 (four) hours as needed for shortness of breath.     diphenhydrAMINE (BENADRYL ALLERGY) 25 MG tablet Take 25 mg by mouth at bedtime  as needed for allergies.     famotidine (PEPCID AC) 10 MG tablet Take 10 mg by mouth daily as needed for heartburn or indigestion.     fluticasone (FLONASE SENSIMIST) 27.5 MCG/SPRAY nasal spray Place 1 spray into the nose daily.     guaiFENesin  (MUCINEX ) 600 MG 12 hr tablet Take 600 mg by mouth 2 (two) times daily as needed for to loosen phlegm.     ibuprofen (ADVIL,MOTRIN) 200 MG tablet Take 200 mg by mouth every 6 (six) hours as needed for moderate pain.     ipratropium (ATROVENT ) 0.03 % nasal spray Place 2 sprays into both nostrils 2 (two) times daily as needed for rhinitis. 30 mL 12   ipratropium-albuterol  (DUONEB) 0.5-2.5 (3) MG/3ML SOLN Take 3 mLs by nebulization every 6 (six) hours as needed. 120 mL 5   Multiple Vitamin (MULTIVITAMIN WITH MINERALS) TABS tablet Take 1 tablet by mouth daily.     Nutritional Supplements (HIGH-PROTEIN NUTRITIONAL SHAKE PO) Take 1 Can by mouth daily. Ensure     telmisartan (MICARDIS) 40 MG tablet Take 40 mg by mouth daily.     tetrahydrozoline-zinc (VISINE-AC) 0.05-0.25 % ophthalmic solution Place 1 drop into both eyes 3 (three) times daily as needed (dry eyes).     traMADol  (ULTRAM ) 50 MG tablet Take 1 tablet (50 mg total) by mouth every 6 (six) hours as needed. 10 tablet 0   mometasone -formoterol  (DULERA ) 200-5 MCG/ACT AERO Inhale 2 puffs into the lungs 2 (two) times daily. 1 each 5   letrozole  (FEMARA ) 2.5 MG tablet TAKE 1 TABLET BY MOUTH EVERY DAY 90 tablet 1   No facility-administered medications prior to visit.        Objective:   Physical Exam  Vitals:   05/06/24 1007  BP: 126/64  Pulse: 95  Temp: 98 F (36.7 C)  SpO2: 94%  Weight: 101 lb 3.2 oz (45.9 kg)  Height: 5' 3 (1.6 m)   Gen: Pleasant, thin, in no distress,  normal affect  ENT: No lesions,  mouth clear,  oropharynx clear, no postnasal drip  Neck: No JVD, no stridor  Lungs: No use of accessory muscles, very distant, few scattered expiratory wheezes  Cardiovascular: RRR, heart  sounds normal, no murmur or gallops, no peripheral edema  Musculoskeletal: No deformities, no cyanosis or clubbing  Neuro: alert, awake, non focal  Skin: Warm, no lesions or rash     Assessment & Plan:  Pulmonary nodules Pulmonary nodules in  the patient with a history of non-small cell lung cancer (treated) and also newly diagnosed breast cancer surgically resected in 03/2024.  New right lower lobe nodule is poorly formed, unclear significance but will need to be followed.  Her other nodules would need to be followed as well.  These have been stable on serial imaging.  I will repeat her CT chest in January and we will follow-up to review  COPD with emphysema [facility]) She has significant exertional shortness of breath, is unclear whether she has improved with any of the inhaled medication that she has been on.  She questions whether she was worse on Trelegy and Breztri .  We do have notes to confirm that she did not tolerate powdered inhalers.  Will to try her on Stiolto to see if she benefits.  Importantly she has noticed desaturations and part of her failure to improve with BD may be that she is desaturating.  We will perform a walking oximetry today and get her exertional oxygen if she qualifies.  I personally spent a total of 37 minutes in the care of the patient today including preparing to see the patient, getting/reviewing separately obtained history, performing a medically appropriate exam/evaluation, counseling and educating, placing orders, documenting clinical information in the EHR, independently interpreting results, and communicating results.    Lamar Chris, MD, PhD 05/06/2024, 10:51 AM [facility] [phone] or if no answer before 7:00PM call [phone] For any issues after 7:00PM please call eLink [phone]

## 2024-05-06 NOTE — Assessment & Plan Note (Signed)
 Pulmonary nodules in the patient with a history of non-small cell lung cancer (treated) and also newly diagnosed breast cancer surgically resected in 03/2024.  New right lower lobe nodule is poorly formed, unclear significance but will need to be followed.  Her other nodules would need to be followed as well.  These have been stable on serial imaging.  I will repeat her CT chest in January and we will follow-up to review

## 2024-05-06 NOTE — Addendum Note (Signed)
 Addended byBETHA FRIES, Torre Pikus A on: 05/06/2024 11:37 AM   Modules accepted: Orders

## 2024-05-06 NOTE — Telephone Encounter (Signed)
 Order has been sent to [employer] as Urgent. NFN

## 2024-05-09 NOTE — Telephone Encounter (Signed)
 Copied from CRM #[id number]. Topic: Clinical - Order For Equipment >> May 09, 2024 10:19 AM [facility] wrote: Reason for CRM: patient called to speak with Maley Venezia, she stated that an order for oxygen was placed for her  on Friday and it has arrived, however she would like to know exactly what was placed in the order because she has received a lot of items she does not want  patient callback [phone]   Called and spoke with the pt and advised we placed order for POC and O2 tanks to use when she is at home.  [employer] provided the pt with long tubing to use when the pt is at home.  Pt states she was supplied with a huge oxygen tank and she does not have anywhere to store it. I advised pt to contact DME company and see if they can come pick up the large tank but advised pt she needs to keep regular tank.

## 2024-06-22 ENCOUNTER — Ambulatory Visit
Admission: RE | Admit: 2024-06-22 | Discharge: 2024-06-22 | Disposition: A | Discharge status: Home / Self Care | Source: Ambulatory Visit | Attending: Emergency Medicine | Admitting: Emergency Medicine

## 2024-06-22 DIAGNOSIS — R918 Other nonspecific abnormal finding of lung field: Secondary | ICD-10-CM

## 2024-07-01 ENCOUNTER — Ambulatory Visit: Payer: Self-pay | Admitting: Emergency Medicine

## 2024-07-04 NOTE — Telephone Encounter (Signed)
 Spoke with patient Jessica Gilbert, see you at next OV

## 2024-07-13 ENCOUNTER — Encounter: Payer: Self-pay | Admitting: Emergency Medicine

## 2024-07-13 ENCOUNTER — Ambulatory Visit: Admitting: Emergency Medicine

## 2024-07-13 VITALS — BP 126/76 | HR 92 | Temp 98.0°F | Ht 63.0 in | Wt 103.0 lb

## 2024-07-13 DIAGNOSIS — Z87891 Personal history of nicotine dependence: Secondary | ICD-10-CM

## 2024-07-13 DIAGNOSIS — J439 Emphysema, unspecified: Secondary | ICD-10-CM | POA: Diagnosis not present

## 2024-07-13 DIAGNOSIS — J9611 Chronic respiratory failure with hypoxia: Secondary | ICD-10-CM | POA: Diagnosis not present

## 2024-07-13 DIAGNOSIS — J438 Other emphysema: Secondary | ICD-10-CM

## 2024-07-13 DIAGNOSIS — R918 Other nonspecific abnormal finding of lung field: Secondary | ICD-10-CM | POA: Diagnosis not present

## 2024-07-13 NOTE — Patient Instructions (Addendum)
 We reviewed your CT scan of the chest today.  This was a reassuring test that showed improvement in a right lower lobe pulmonary nodule and stability in your other nodules.  Good news. We will plan to recheck your CT scan of the chest in July 2026. Please continue your Dulera  2 puffs twice a day.  Rinse and gargle after using. Keep albuterol  available to use 2 puffs if needed for shortness of breath, chest tightness, wheezing. You need to continue to use your oxygen at 3 L/min with heavier exertion.  Our goal is to keep your saturations > 90%. Follow Dr. Shelah in July after your CT chest so we can review your results together.  Please call sooner if you have any problems.

## 2024-07-13 NOTE — Assessment & Plan Note (Signed)
 Overall stable and working on her exertional tolerance postop.  Plan continue Dulera .  Albuterol  available if needed.  Maintain her vaccinations up-to-date.

## 2024-07-13 NOTE — Progress Notes (Signed)
 "  Subjective:    Patient ID: Jessica Gilbert, female    DOB: September 08, 1945, 79 y.o.   MRN: [id number]  HPI   ROV 07/13/2024 --Jessica follows up today to review CT scan of the chest that was done 06/22/2024.  She has a history of breast cancer (2025), severe COPD, non-small cell lung cancers of the right upper lobe (lobectomy 2005) and lingula (SBRT 2023).  Also with allergic rhinitis.  She has been managed with Dulera  (felt worse with both Breztri  and Trelegy).  Her chest CT 03/2024 showed some new peripheral right lower lobe nodularity, new irregular left upper lobe nodule 11 mm, other unchanged nodules. She has been doing well since her breast sgy w Dr Curvin.  She has been fairly active. She has a a POC, uses 3L/min w some exertion, not all.   CT scan of the chest 06/22/2024 reviewed by me showed severe emphysema and diffuse bronchial wall thickening.  No mediastinal or hilar adenopathy.  Scattered bilateral nodules that are all stable to decreased in size.  The recently identified 9 mm right lower lobe nodule has significantly decreased in size.  No concerning new nodules or other findings.   Review of Systems As per HPI  Past Medical History:  Diagnosis Date   Acute respiratory failure (HCC)    Blood dyscrasia    Breast cancer (HCC)    left breast   Carotid stenosis, right    Celiac disease    Dyspnea    Emphysema lung (HCC)    Factor 5 Leiden mutation, heterozygous    States no symptoms   History of hiatal hernia    History of radiation therapy    Left Lung-03/24/22-04/04/22- Dr. Lynwood Nasuti   Human metapneumovirus pneumonia 08/2013   Hyperlipidemia    Hypertension    Lung cancer (HCC) 2005   Rt upper lobe   Peripheral vascular disease    Carotid Artery Stenosis   Stroke (HCC)    TIA w/o defecits     Family History  Problem Relation Age of Onset   Heart disease Father    High blood pressure Father    Congestive Heart Failure Father    Leukemia Mother    Colon polyps  Mother    High blood pressure Brother    Lung cancer Brother    Heart disease Brother    Diabetes Brother    Colon polyps Sister    Breast cancer Sister    High blood pressure Sister    Emphysema Sister    Pancreatic cancer Paternal Uncle      Social History   Socioeconomic History   Marital status: Divorced    Spouse name: Not on file   Number of children: Not on file   Years of education: Not on file   Highest education level: Not on file  Occupational History   Not on file  Tobacco Use   Smoking status: Former    Current packs/day: 0.00    Average packs/day: 1 pack/day for 35.0 years (35.0 ttl pk-yrs)    Types: Cigarettes    Start date: 01/28/1969    Quit date: 01/29/2004    Years since quitting: 20.4   Smokeless tobacco: Never  Vaping Use   Vaping status: Never Used  Substance and Sexual Activity   Alcohol use: Not Currently   Drug use: No   Sexual activity: Not on file  Other Topics Concern   Not on file  Social History Narrative   Not on  file   Social Drivers of Health   Tobacco Use: Medium Risk (07/13/2024)   Patient History    Smoking Tobacco Use: Former    Smokeless Tobacco Use: Never    Passive Exposure: Not on [place] Strain: Not on file  Food Insecurity: Not on file  Transportation Needs: Not on file  Physical Activity: Not on file  Stress: Not on file  Social Connections: Not on file  Intimate Partner Violence: Not on file  Depression (EYV7-0): Not on file  Alcohol Screen: Not on file  Housing: Unknown (02/18/2024)   Received from 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  No inhaled exposures to her knowledge    Allergies  Allergen Reactions   Aspirin Other (See Comments)    burning stomach   Codeine Nausea Only   Vitamin D Analogs Other (See Comments)    Optical migraines      Outpatient Medications Prior to Visit  Medication Sig Dispense Refill   albuterol  (VENTOLIN  HFA) 108 (90 Base) MCG/ACT inhaler Inhale 1 puff into the lungs every 4 (four) hours as needed for shortness of breath.     diphenhydrAMINE (BENADRYL ALLERGY) 25 MG tablet Take 25 mg by mouth at bedtime as needed for allergies.     DULERA  200-5 MCG/ACT AERO Inhale 2 puffs into the lungs 2 (two) times daily.     famotidine (PEPCID AC) 10 MG tablet Take 10 mg by mouth daily as needed for heartburn or indigestion.     fluticasone (FLONASE SENSIMIST) 27.5 MCG/SPRAY nasal spray Place 1 spray into the nose daily.     guaiFENesin  (MUCINEX ) 600 MG 12 hr tablet Take 600 mg by mouth 2 (two) times daily as needed for to loosen phlegm.     ibuprofen (ADVIL,MOTRIN) 200 MG tablet Take 200 mg by mouth every 6 (six) hours as needed for moderate pain.     ipratropium (ATROVENT ) 0.03 % nasal spray Place 2 sprays into both nostrils 2 (two) times daily as needed for rhinitis. 30 mL 12   ipratropium-albuterol  (DUONEB) 0.5-2.5 (3) MG/3ML SOLN Take 3 mLs by nebulization every 6 (six) hours as needed. 120 mL 5   Multiple Vitamin (MULTIVITAMIN WITH MINERALS) TABS tablet Take 1 tablet by mouth daily.     Nutritional Supplements (HIGH-PROTEIN NUTRITIONAL SHAKE PO) Take 1 Can by mouth daily. Ensure     telmisartan (MICARDIS) 40 MG tablet Take 40 mg by mouth daily.     tetrahydrozoline-zinc (VISINE-AC) 0.05-0.25 % ophthalmic solution Place 1 drop into both eyes 3 (three) times daily as needed (dry eyes).     traMADol  (ULTRAM ) 50 MG tablet Take 1 tablet (50 mg total) by mouth every 6 (six) hours as needed. 10 tablet 0   letrozole  (FEMARA ) 2.5 MG tablet TAKE 1 TABLET BY MOUTH EVERY DAY 90 tablet 1   Tiotropium  Bromide-Olodaterol (STIOLTO RESPIMAT ) 2.5-2.5 MCG/ACT AERS Inhale 2 puffs into the lungs daily. 4 g 11   No facility-administered medications prior to visit.  Objective:   Physical Exam  Vitals:   07/13/24 1528  BP: 126/76  Pulse: 92  Temp: 98 F (36.7 C)  TempSrc: Oral  SpO2: 94%  Weight: 103 lb (46.7 kg)  Height: 5' 3 (1.6 m)   Gen: Pleasant, thin, in no distress,  normal affect  ENT: No lesions,  mouth clear,  oropharynx clear, no postnasal drip  Neck: No JVD, no stridor  Lungs: No use of accessory muscles, distant breath sound  Cardiovascular: RRR, heart sounds normal, no murmur or gallops, no peripheral edema  Musculoskeletal: No deformities, no cyanosis or clubbing  Neuro: alert, awake, non focal  Skin: Warm, no lesions or rash     Assessment & Plan:  COPD with emphysema (HCC) Overall stable and working on her exertional tolerance postop.  Plan continue Dulera .  Albuterol  available if needed.  Maintain her vaccinations up-to-date.  Chronic respiratory failure with hypoxia (HCC) Has oxygen available to use with exertion.  She has been titrated to 3 L/min pulsed via POC.  Unclear whether she is using as consistently as necessary.  I did discuss with her the need to keep her SpO2 > 90%.  Her to wear with exertion that would cause desaturation.  She understands.  Pulmonary nodules Pulmonary nodules in a patient with a history of non-small cell lung cancer.  All of these are stable and the most recent nodule of concern in the right lower lobe is actually decreased in size consistent with an inflammatory process.  Reassuring study.  We will plan to repeat her CT scan of the chest at the 47-month mark which would be July 2026.   I personally spent a total of 32 minutes in the care of the patient today including preparing to see the patient, getting/reviewing separately obtained history, performing a medically appropriate exam/evaluation, counseling and educating,  placing orders, documenting clinical information in the EHR, independently interpreting results, and communicating results.    Lamar Chris, MD, PhD 07/13/2024, 4:08 PM [facility] [phone] or if no answer before 7:00PM call [phone] For any issues after 7:00PM please call eLink [phone]   "

## 2024-07-13 NOTE — Assessment & Plan Note (Signed)
 Pulmonary nodules in a patient with a history of non-small cell lung cancer.  All of these are stable and the most recent nodule of concern in the right lower lobe is actually decreased in size consistent with an inflammatory process.  Reassuring study.  We will plan to repeat her CT scan of the chest at the 103-month mark which would be July 2026.

## 2024-07-13 NOTE — Assessment & Plan Note (Signed)
 Has oxygen available to use with exertion.  She has been titrated to 3 L/min pulsed via POC.  Unclear whether she is using as consistently as necessary.  I did discuss with her the need to keep her SpO2 > 90%.  Her to wear with exertion that would cause desaturation.  She understands.
